# Patient Record
Sex: Female | Born: 1990 | Race: Black or African American | Hispanic: No | Smoking: Former smoker
Health system: Southern US, Community
[De-identification: ages and names within clinical notes are randomized; demographics above are authoritative.]

## PROBLEM LIST (undated history)

## (undated) ENCOUNTER — Inpatient Hospital Stay (HOSPITAL_COMMUNITY): Payer: Self-pay

## (undated) DIAGNOSIS — J45909 Unspecified asthma, uncomplicated: Secondary | ICD-10-CM

## (undated) DIAGNOSIS — M2550 Pain in unspecified joint: Secondary | ICD-10-CM

## (undated) DIAGNOSIS — A749 Chlamydial infection, unspecified: Secondary | ICD-10-CM

## (undated) DIAGNOSIS — F419 Anxiety disorder, unspecified: Secondary | ICD-10-CM

## (undated) DIAGNOSIS — F32A Depression, unspecified: Secondary | ICD-10-CM

## (undated) DIAGNOSIS — D649 Anemia, unspecified: Secondary | ICD-10-CM

## (undated) DIAGNOSIS — F329 Major depressive disorder, single episode, unspecified: Secondary | ICD-10-CM

## (undated) HISTORY — PX: WISDOM TOOTH EXTRACTION: SHX21

## (undated) HISTORY — PX: APPENDECTOMY: SHX54

## (undated) HISTORY — DX: Anxiety disorder, unspecified: F41.9

---

## 2011-11-18 ENCOUNTER — Emergency Department: Payer: Self-pay | Admitting: Unknown Physician Specialty

## 2011-11-18 LAB — BASIC METABOLIC PANEL
Anion Gap: 5 — ABNORMAL LOW (ref 7–16)
BUN: 13 mg/dL (ref 7–18)
Calcium, Total: 9 mg/dL (ref 8.5–10.1)
Chloride: 106 mmol/L (ref 98–107)
Co2: 28 mmol/L (ref 21–32)
Creatinine: 1 mg/dL (ref 0.60–1.30)
EGFR (African American): >60
Osmolality: 277 (ref 275–301)
Potassium: 4 mmol/L (ref 3.5–5.1)
Sodium: 139 mmol/L (ref 136–145)

## 2011-11-18 LAB — URINALYSIS, COMPLETE
Bacteria: NONE SEEN
Bilirubin,UR: NEGATIVE
Glucose,UR: NEGATIVE mg/dL (ref 0–75)
Ketone: NEGATIVE
Leukocyte Esterase: NEGATIVE
Nitrite: NEGATIVE
RBC,UR: 7 /HPF (ref 0–5)
Specific Gravity: 1.031 (ref 1.003–1.030)
WBC UR: 3 /HPF (ref 0–5)

## 2011-11-18 LAB — CBC
MCH: 29.3 pg (ref 26.0–34.0)
MCHC: 33.7 g/dL (ref 32.0–36.0)
MCV: 87 fL (ref 80–100)
Platelet: 224 10*3/uL (ref 150–440)
RBC: 3.93 10*6/uL (ref 3.80–5.20)
RDW: 14 % (ref 11.5–14.5)

## 2011-11-18 LAB — HCG, QUANTITATIVE, PREGNANCY: Beta Hcg, Quant.: <1 m[IU]/mL — ABNORMAL LOW

## 2011-12-21 ENCOUNTER — Encounter (HOSPITAL_COMMUNITY): Payer: Self-pay

## 2011-12-21 ENCOUNTER — Inpatient Hospital Stay (HOSPITAL_COMMUNITY)
Admission: AD | Admit: 2011-12-21 | Discharge: 2011-12-21 | Disposition: A | Discharge status: Hospice | Payer: Medicaid Other | Source: Ambulatory Visit | Attending: Family Medicine | Admitting: Family Medicine

## 2011-12-21 ENCOUNTER — Inpatient Hospital Stay (HOSPITAL_COMMUNITY): Payer: Medicaid Other

## 2011-12-21 DIAGNOSIS — R109 Unspecified abdominal pain: Secondary | ICD-10-CM | POA: Insufficient documentation

## 2011-12-21 DIAGNOSIS — O99891 Other specified diseases and conditions complicating pregnancy: Secondary | ICD-10-CM | POA: Insufficient documentation

## 2011-12-21 DIAGNOSIS — O26899 Other specified pregnancy related conditions, unspecified trimester: Secondary | ICD-10-CM

## 2011-12-21 DIAGNOSIS — M545 Low back pain, unspecified: Secondary | ICD-10-CM | POA: Insufficient documentation

## 2011-12-21 HISTORY — DX: Unspecified asthma, uncomplicated: J45.909

## 2011-12-21 LAB — URINALYSIS, ROUTINE W REFLEX MICROSCOPIC
Bilirubin Urine: NEGATIVE
Nitrite: NEGATIVE
Protein, ur: NEGATIVE mg/dL
Specific Gravity, Urine: >1.03 — ABNORMAL HIGH (ref 1.005–1.030)
Urobilinogen, UA: 0.2 mg/dL (ref 0.0–1.0)

## 2011-12-21 LAB — CBC
MCH: 29.3 pg (ref 26.0–34.0)
MCHC: 34 g/dL (ref 30.0–36.0)
MCV: 86.4 fL (ref 78.0–100.0)
Platelets: 223 10*3/uL (ref 150–400)
RDW: 13.7 % (ref 11.5–15.5)
WBC: 8 10*3/uL (ref 4.0–10.5)

## 2011-12-21 LAB — WET PREP, GENITAL: Yeast Wet Prep HPF POC: NONE SEEN

## 2011-12-21 NOTE — MAU Note (Signed)
Pt c/o urgency and low abd pain for the past week.  Says her bladder feels like she needs to void more when the stream is done.

## 2011-12-21 NOTE — MAU Note (Signed)
Stomach & back pain x1 week. Sharp pain in back, "small sharp pains" in abdomen when urinates. Denies vaginal bleeding or discharge. LMP 11/14/11

## 2011-12-21 NOTE — MAU Provider Note (Signed)
History     CSN: [id number]  Arrival date and time: 12/21/11 1601   First Provider Initiated Contact with Patient 12/21/11 1751      Chief Complaint  Patient presents with  . Abdominal Pain   HPI Claudia Gonzales is 21 y.o. G2P0101 [redacted]w[redacted]d weeks presenting with lower back and lower abdominal pain X 1 week.  Describes as sharp.  Concerned because she delivered her son at 98- 30 weeks.  Denies vaginal bleeding.   Nausea with 1X day vomiting.  Recently moved from [place].     Past Medical History  Diagnosis Date  . Asthma     Past Surgical History  Procedure Date  . Appendectomy   . Wisdom tooth extraction     Family History  Problem Relation Age of Onset  . Hypertension Mother   . Cancer Father   . Cancer Maternal Grandmother   . Cancer Maternal Grandfather   . Cancer Paternal Grandmother   . Cancer Paternal Grandfather     History  Substance Use Topics  . Smoking status: Current Every Day Smoker -- 0.5 packs/day for 1 years    Types: Cigarettes  . Smokeless tobacco: Not on file  . Alcohol Use: 2.3 oz/week    1 Glasses of wine, 1 Cans of beer, 1 Shots of liquor, 1 Drinks containing 0.5 oz of alcohol per week     weekly    Allergies: No Known Allergies  No prescriptions prior to admission    Review of Systems  Constitutional: Negative.   Respiratory: Negative.   Cardiovascular: Negative.   Gastrointestinal: Positive for nausea, vomiting (x1 daily) and abdominal pain (lower).  Genitourinary:       Neg for bleeding or discharge   Physical Exam   Blood pressure 116/59, pulse 88, temperature 99 F (37.2 C), temperature source Oral, resp. rate 16, height 5' 6.5" (1.689 m), weight 98.612 kg (217 lb 6.4 oz), last menstrual period 11/14/2011, SpO2 100.00%.  Physical Exam  Constitutional: She is oriented to person, place, and time. She appears well-developed and well-nourished. No distress.  HENT:  Head: Normocephalic.  Neck: Normal range of motion.    Cardiovascular: Normal rate.   Respiratory: Effort normal.  GI: Soft. She exhibits no distension and no mass. There is tenderness (mild tenderness bilaterally lower quads). There is no rebound and no guarding.  Genitourinary: Uterus is not enlarged and not tender. Cervix exhibits no motion tenderness, no discharge and no friability. Right adnexum displays no mass, no tenderness and no fullness. Left adnexum displays tenderness (mild ). Left adnexum displays no mass and no fullness. No tenderness or bleeding around the vagina. Vaginal discharge (frothy with odor) found.  Neurological: She is alert and oriented to person, place, and time.  Skin: Skin is warm and dry.  Psychiatric: She has a normal mood and affect. Her behavior is normal.   Results for orders placed during the hospital encounter of 12/21/11 (from the past 24 hour(s))  URINALYSIS, ROUTINE W REFLEX MICROSCOPIC     Status: Abnormal   Collection Time   12/21/11  4:20 PM      Component Value Range   Color, Urine YELLOW  YELLOW   APPearance CLEAR  CLEAR   Specific Gravity, Urine >1.030 (*) 1.005 - 1.030   pH 6.0  5.0 - 8.0   Glucose, UA NEGATIVE  NEGATIVE mg/dL   Hgb urine dipstick NEGATIVE  NEGATIVE   Bilirubin Urine NEGATIVE  NEGATIVE   Ketones, ur NEGATIVE  NEGATIVE mg/dL  Protein, ur NEGATIVE  NEGATIVE mg/dL   Urobilinogen, UA 0.2  0.0 - 1.0 mg/dL   Nitrite NEGATIVE  NEGATIVE   Leukocytes, UA NEGATIVE  NEGATIVE  POCT PREGNANCY, URINE     Status: Abnormal   Collection Time   12/21/11  4:32 PM      Component Value Range   Preg Test, Ur POSITIVE (*) NEGATIVE  CBC     Status: Abnormal   Collection Time   12/21/11  6:09 PM      Component Value Range   WBC 8.0  4.0 - 10.5 K/uL   RBC 3.75 (*) 3.87 - 5.11 MIL/uL   Hemoglobin 11.0 (*) 12.0 - 15.0 g/dL   HCT 54.0 (*) 98.1 - 19.1 %   MCV 86.4  78.0 - 100.0 fL   MCH 29.3  26.0 - 34.0 pg   MCHC 34.0  30.0 - 36.0 g/dL   RDW 47.8  29.5 - 62.1 %   Platelets 223  150 - 400 K/uL   ABO/RH     Status: Normal   Collection Time   12/21/11  6:09 PM      Component Value Range   ABO/RH(D) B POS    HCG, QUANTITATIVE, PREGNANCY     Status: Abnormal   Collection Time   12/21/11  6:10 PM      Component Value Range   hCG, Beta Chain, Quant, S 936 (*) <5 mIU/mL  WET PREP, GENITAL     Status: Abnormal   Collection Time   12/21/11  6:25 PM      Component Value Range   Yeast Wet Prep HPF POC NONE SEEN  NONE SEEN   Trich, Wet Prep NONE SEEN  NONE SEEN   Clue Cells Wet Prep HPF POC NONE SEEN  NONE SEEN   WBC, Wet Prep HPF POC MODERATE (*) NONE SEEN   MAU Course  Procedures  GC/CHL culture to lab  MDM  20:00 Care turned over to [place], [place] US Ob Transvaginal  12/21/2011  *RADIOLOGY REPORT*  Clinical Data: Pregnancy.  Abdomen and back pain.  OBSTETRIC <14 WK [place] AND TRANSVAGINAL OB US  Technique:  Both transabdominal and transvaginal ultrasound examinations were performed for complete evaluation of the gestation as well as the maternal uterus, adnexal regions, and pelvic cul-de-sac.  Transvaginal technique was performed to assess early pregnancy.  Comparison:  None.  Intrauterine gestational sac:  There is a suspected early intrauterine gestational sac, quite small. Yolk sac: No Embryo: None seen Cardiac Activity: None Heart Rate: n/a bpm  MSD: 2.3 mm  4 w 5 d  Maternal uterus/adnexae: Anterior fundal fibroid,  3.2 x 3.4 cm in cross section.  Normal ovaries. Trace free fluid.  IMPRESSION: Possible early intrauterine gestational sac, 2.3 mm diameter, corresponding to a approximately 4-week-5-day pregnancy.  Continued surveillance is warranted.  No adnexal mass or significant free fluid.  Anterior fundal uterine fibroid 3.2 x 3.4 mm cross-section.   Original Report Authenticated By: Elsie Stain, M.D.     Assessment and Plan  A:  Abdominal pain in early pregnancy      Cannot rule out ectopic yet, but probable IUP  P:  Return in 2 days for quant HCG and will repeat US in 1-2  weeks. Wynelle Bourgeois CNM  KEY,EVE M 12/21/2011, 7:55 PM

## 2011-12-21 NOTE — MAU Provider Note (Signed)
Chart reviewed and agree with management and plan.  

## 2011-12-22 LAB — GC/CHLAMYDIA PROBE AMP, GENITAL
Chlamydia, DNA Probe: POSITIVE — AB
GC Probe Amp, Genital: NEGATIVE

## 2012-01-20 ENCOUNTER — Encounter (HOSPITAL_COMMUNITY): Payer: Self-pay

## 2012-01-20 ENCOUNTER — Inpatient Hospital Stay (HOSPITAL_COMMUNITY)
Admission: AD | Admit: 2012-01-20 | Discharge: 2012-01-20 | Disposition: A | Discharge status: Home / Self Care | Payer: Medicaid Other | Source: Ambulatory Visit | Attending: Obstetrics & Gynecology | Admitting: Obstetrics & Gynecology

## 2012-01-20 DIAGNOSIS — O219 Vomiting of pregnancy, unspecified: Secondary | ICD-10-CM | POA: Diagnosis present

## 2012-01-20 DIAGNOSIS — O99891 Other specified diseases and conditions complicating pregnancy: Secondary | ICD-10-CM | POA: Insufficient documentation

## 2012-01-20 DIAGNOSIS — Z3490 Encounter for supervision of normal pregnancy, unspecified, unspecified trimester: Secondary | ICD-10-CM

## 2012-01-20 DIAGNOSIS — R109 Unspecified abdominal pain: Secondary | ICD-10-CM | POA: Insufficient documentation

## 2012-01-20 LAB — URINALYSIS, ROUTINE W REFLEX MICROSCOPIC
Nitrite: NEGATIVE
Protein, ur: NEGATIVE mg/dL
Specific Gravity, Urine: >1.03 — ABNORMAL HIGH (ref 1.005–1.030)
Urobilinogen, UA: 0.2 mg/dL (ref 0.0–1.0)

## 2012-01-20 MED ORDER — ONDANSETRON 8 MG PO TBDP
8.0000 mg | ORAL_TABLET | ORAL | Status: AC
  Administered 2012-01-20: 8 mg via ORAL
  Filled 2012-01-20: qty 1

## 2012-01-20 MED ORDER — ONDANSETRON 4 MG PO TBDP: 4.0000 mg | ORAL_TABLET | Freq: Four times a day (QID) | ORAL | Status: DC | PRN

## 2012-01-20 MED ORDER — AZITHROMYCIN 250 MG PO TABS
1000.0000 mg | ORAL_TABLET | ORAL | Status: AC
  Administered 2012-01-20: 1000 mg via ORAL
  Filled 2012-01-20: qty 4

## 2012-01-20 NOTE — MAU Note (Signed)
Pt 9.4wks having lower abd pain that comes and goes for a month.  Denies bleeding or discharge.

## 2012-01-20 NOTE — MAU Provider Note (Signed)
Chief Complaint: Abdominal Pain   First Provider Initiated Contact with Patient 01/20/12 2031     SUBJECTIVE HPI: Claudia Gonzales is a 21 y.o. G2P0101 at [redacted]w[redacted]d by LMP who presents to maternity admissions reporting bilateral inguinal pain with sneezing, movement.  She was seen 12/21/11 and had U/S indicating possible gestational sac and was instructed to return to MAU for repeat quant hcg in 48 hours.  She was unable to get to MAU for f/u and has not been seen since this date 1 month ago.  She was informed of her diagnoses of Chlamydia, from swab collected 12/21/11, and did take PO meds at health department, but vomited a few minutes after taking medication.  She did not tell any health department staff as she vomited on the way out of the building.  She denies vaginal bleeding, vaginal itching/burning, urinary symptoms, h/a, dizziness, n/v, or fever/chills.     Past Medical History  Diagnosis Date  . Asthma    Past Surgical History  Procedure Date  . Appendectomy   . Wisdom tooth extraction    History   Social History  . Marital Status: Single    Spouse Name: N/A    Number of Children: N/A  . Years of Education: N/A   Occupational History  . Not on file.   Social History Main Topics  . Smoking status: Current Every Day Smoker -- 0.5 packs/day for 1 years    Types: Cigarettes  . Smokeless tobacco: Not on file  . Alcohol Use: 2.3 oz/week    1 Glasses of wine, 1 Cans of beer, 1 Shots of liquor, 1 Drinks containing 0.5 oz of alcohol per week     weekly  . Drug Use: No  . Sexually Active: Yes    Birth Control/ Protection: None   Other Topics Concern  . Not on file   Social History Narrative  . No narrative on file   No current facility-administered medications on file prior to encounter.   No current outpatient prescriptions on file prior to encounter.   No Known Allergies  ROS: Pertinent items in HPI  OBJECTIVE Blood pressure 114/66, pulse 86, resp. rate 16, height 5'  6.5" (1.689 m), weight 98.884 kg (218 lb), last menstrual period 11/14/2011. GENERAL: Well-developed, well-nourished female in no acute distress.  HEENT: Normocephalic HEART: normal rate RESP: normal effort ABDOMEN: Soft, non-tender EXTREMITIES: Nontender, no edema NEURO: Alert and oriented SPECULUM EXAM: Deferred  LAB RESULTS Results for orders placed during the hospital encounter of 01/20/12 (from the past 24 hour(s))  URINALYSIS, ROUTINE W REFLEX MICROSCOPIC     Status: Abnormal   Collection Time   01/20/12  7:30 PM      Component Value Range   Color, Urine YELLOW  YELLOW   APPearance CLEAR  CLEAR   Specific Gravity, Urine >1.030 (*) 1.005 - 1.030   pH 6.0  5.0 - 8.0   Glucose, UA NEGATIVE  NEGATIVE mg/dL   Hgb urine dipstick NEGATIVE  NEGATIVE   Bilirubin Urine NEGATIVE  NEGATIVE   Ketones, ur 15 (*) NEGATIVE mg/dL   Protein, ur NEGATIVE  NEGATIVE mg/dL   Urobilinogen, UA 0.2  0.0 - 1.0 mg/dL   Nitrite NEGATIVE  NEGATIVE   Leukocytes, UA NEGATIVE  NEGATIVE    IMAGING Bedside sono confirms IUP with FHR  ASSESSMENT No diagnosis found.  PLAN Pt retreated for Chlamydia with azithromycin 1000 mg PO and Zofran 8 mg ODT in MAU Test of cure to be collected during prenatal  care Discharge home Zofran ODT 4 mg PO Q 8 hours PRN nausea Encourage increased PO fluids F/U with early prenatal care Pregnancy verification letter and list of providers given Return to MAU as needed     Medication List     As of 01/20/2012  9:06 PM    ASK your doctor about these medications         acetaminophen 500 MG tablet   Commonly known as: TYLENOL   Take 1,000 mg by mouth every 6 (six) hours as needed. For pain         Sharen Counter Certified Nurse-Midwife 01/20/2012  9:06 PM

## 2012-01-20 NOTE — MAU Note (Signed)
Patient is not in the lobby when called to triage.  

## 2012-03-16 ENCOUNTER — Inpatient Hospital Stay (HOSPITAL_COMMUNITY)
Admission: AD | Admit: 2012-03-16 | Discharge: 2012-03-16 | Disposition: A | Discharge status: Home / Self Care | Payer: Medicaid Other | Source: Ambulatory Visit | Attending: Obstetrics & Gynecology | Admitting: Obstetrics & Gynecology

## 2012-03-16 ENCOUNTER — Encounter (HOSPITAL_COMMUNITY): Payer: Self-pay

## 2012-03-16 ENCOUNTER — Inpatient Hospital Stay (HOSPITAL_COMMUNITY): Payer: Medicaid Other

## 2012-03-16 DIAGNOSIS — O2390 Unspecified genitourinary tract infection in pregnancy, unspecified trimester: Secondary | ICD-10-CM | POA: Insufficient documentation

## 2012-03-16 DIAGNOSIS — B9689 Other specified bacterial agents as the cause of diseases classified elsewhere: Secondary | ICD-10-CM | POA: Insufficient documentation

## 2012-03-16 DIAGNOSIS — A499 Bacterial infection, unspecified: Secondary | ICD-10-CM | POA: Insufficient documentation

## 2012-03-16 DIAGNOSIS — R109 Unspecified abdominal pain: Secondary | ICD-10-CM | POA: Insufficient documentation

## 2012-03-16 DIAGNOSIS — N760 Acute vaginitis: Secondary | ICD-10-CM | POA: Insufficient documentation

## 2012-03-16 LAB — CBC
HCT: 28.7 % — ABNORMAL LOW (ref 36.0–46.0)
MCHC: 34.5 g/dL (ref 30.0–36.0)
RDW: 14 % (ref 11.5–15.5)

## 2012-03-16 LAB — URINALYSIS, ROUTINE W REFLEX MICROSCOPIC
Ketones, ur: NEGATIVE mg/dL
Protein, ur: NEGATIVE mg/dL
Urobilinogen, UA: 0.2 mg/dL (ref 0.0–1.0)

## 2012-03-16 LAB — DIFFERENTIAL
Basophils Absolute: 0 10*3/uL (ref 0.0–0.1)
Basophils Relative: 0 % (ref 0–1)
Eosinophils Absolute: 0.2 10*3/uL (ref 0.0–0.7)
Monocytes Absolute: 0.4 10*3/uL (ref 0.1–1.0)
Neutro Abs: 5 10*3/uL (ref 1.7–7.7)
Neutrophils Relative %: 66 % (ref 43–77)

## 2012-03-16 LAB — URINE MICROSCOPIC-ADD ON

## 2012-03-16 LAB — RPR: RPR Ser Ql: NONREACTIVE

## 2012-03-16 LAB — WET PREP, GENITAL: Trich, Wet Prep: NONE SEEN

## 2012-03-16 LAB — HEPATITIS B SURFACE ANTIGEN: Hepatitis B Surface Ag: NEGATIVE

## 2012-03-16 MED ORDER — METRONIDAZOLE 500 MG PO TABS: 500.0000 mg | ORAL_TABLET | Freq: Two times a day (BID) | ORAL | Status: DC

## 2012-03-16 MED ORDER — PRENATAL VITAMINS PLUS 27-1 MG PO TABS: 1.0000 | ORAL_TABLET | Freq: Every day | ORAL | Status: DC

## 2012-03-16 NOTE — MAU Provider Note (Signed)
Attestation of Attending Supervision of Advanced Practitioner (PA/CNM/NP): Evaluation and management procedures were performed by the Advanced Practitioner under my supervision and collaboration.  I have reviewed the Advanced Practitioner's note and chart, and I agree with the management and plan.  Raffi Milstein, MD, FACOG Attending Obstetrician & Gynecologist Faculty Practice, [facility]  

## 2012-03-16 NOTE — MAU Provider Note (Signed)
History     CSN: [id number]  Arrival date and time: 03/16/12 4696   First Provider Initiated Contact with Patient 03/16/12 [phone]      Chief Complaint  Patient presents with  . Abdominal Cramping   HPI Claudia Gonzales 21 y.o. [redacted]w[redacted]d by LMP of 11-14-11.  Has not yet started prenatal care.  Is having lower abdominal cramping x 2 weeks.  Feels like contractions.  No leaking.  No bleeding.  History of one preterm birth at 30 weeks.  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 Asthma   . Preterm labor     Past Surgical History  Procedure Date  . Appendectomy   . Wisdom tooth extraction     Family History  Problem Relation Age of Onset  . Hypertension Mother   . Cancer Father   . Cancer Maternal Grandmother   . Cancer Maternal Grandfather   . Cancer Paternal Grandmother   . Cancer Paternal Grandfather     History  Substance Use Topics  . Smoking status: Former Smoker -- 0.5 packs/day for 1 years    Types: Cigarettes  . Smokeless tobacco: Never Used  . Alcohol Use: No    Allergies: No Known Allergies  Prescriptions prior to admission  Medication Sig Dispense Refill  . acetaminophen (TYLENOL) 500 MG tablet Take 1,000 mg by mouth every 6 (six) hours as needed. For pain        Review of Systems  Constitutional: Negative for fever.  Gastrointestinal: Positive for abdominal pain. Negative for nausea, vomiting, diarrhea and constipation.  Genitourinary:       No vaginal discharge. No vaginal bleeding. No dysuria.   Physical Exam   Blood pressure 128/71, pulse 94, temperature 97.9 F (36.6 C), temperature source Oral, resp. rate 16, height 5\' 6"  (1.676 m), weight 103.148 kg (227 lb 6.4 oz), last menstrual period 11/14/2011, SpO2 100.00%.  Physical Exam  Nursing note and vitals reviewed. Constitutional: She is oriented to  person, place, and time. She appears well-developed and well-nourished.  HENT:  Head: Normocephalic.  Eyes: EOM are normal.  Neck: Neck supple.  GI: Soft. There is tenderness. There is no rebound and no guarding.  Genitourinary:       Speculum exam: Vagina - Small amount of creamy discharge, no odor Cervix - No contact bleeding Bimanual exam: Cervix closed, thick Uterus 20 week size Adnexa non tender, no masses bilaterally GC/Chlam, wet prep done Chaperone present for exam.  Musculoskeletal: Normal range of motion.  Neurological: She is alert and oriented to person, place, and time.  Skin: Skin is warm and dry.  Psychiatric: She has a normal mood and affect.    MAU Course  Procedures  MDM Ultrasound reviewed.  Confirmed LMP=US dating.   Results for orders placed during the hospital encounter of 03/16/12 (from the past 24 hour(s))  URINALYSIS, ROUTINE W REFLEX MICROSCOPIC     Status: Abnormal   Collection Time   03/16/12  7:40 AM      Component Value Range   Color, Urine YELLOW  YELLOW   APPearance CLEAR  CLEAR   Specific Gravity, Urine >1.030 (*) 1.005 - 1.030   pH 6.0  5.0 - 8.0   Glucose, UA NEGATIVE  NEGATIVE mg/dL  Hgb urine dipstick NEGATIVE  NEGATIVE   Bilirubin Urine NEGATIVE  NEGATIVE   Ketones, ur NEGATIVE  NEGATIVE mg/dL   Protein, ur NEGATIVE  NEGATIVE mg/dL   Urobilinogen, UA 0.2  0.0 - 1.0 mg/dL   Nitrite NEGATIVE  NEGATIVE   Leukocytes, UA MODERATE (*) NEGATIVE  URINE MICROSCOPIC-ADD ON     Status: Abnormal   Collection Time   03/16/12  7:40 AM      Component Value Range   Squamous Epithelial / LPF MANY (*) RARE   WBC, UA 3-6  <3 WBC/hpf   RBC / HPF 0-2  <3 RBC/hpf   Bacteria, UA RARE  RARE  WET PREP, GENITAL     Status: Abnormal   Collection Time   03/16/12  8:40 AM      Component Value Range   Yeast Wet Prep HPF POC NONE SEEN  NONE SEEN   Trich, Wet Prep NONE SEEN  NONE SEEN   Clue Cells Wet Prep HPF POC MODERATE (*) NONE SEEN   WBC, Wet  Prep HPF POC MANY (*) NONE SEEN  CBC     Status: Abnormal   Collection Time   03/16/12  9:25 AM      Component Value Range   WBC 7.6  4.0 - 10.5 K/uL   RBC 3.32 (*) 3.87 - 5.11 MIL/uL   Hemoglobin 9.9 (*) 12.0 - 15.0 g/dL   HCT 96.0 (*) 45.4 - 09.8 %   MCV 86.4  78.0 - 100.0 fL   MCH 29.8  26.0 - 34.0 pg   MCHC 34.5  30.0 - 36.0 g/dL   RDW 11.9  14.7 - 82.9 %   Platelets 182  150 - 400 K/uL  DIFFERENTIAL     Status: Normal   Collection Time   03/16/12  9:25 AM      Component Value Range   Neutrophils Relative 66  43 - 77 %   Neutro Abs 5.0  1.7 - 7.7 K/uL   Lymphocytes Relative 26  12 - 46 %   Lymphs Abs 2.0  0.7 - 4.0 K/uL   Monocytes Relative 6  3 - 12 %   Monocytes Absolute 0.4  0.1 - 1.0 K/uL   Eosinophils Relative 2  0 - 5 %   Eosinophils Absolute 0.2  0.0 - 0.7 K/uL   Basophils Relative 0  0 - 1 %   Basophils Absolute 0.0  0.0 - 0.1 K/uL    Assessment and Plan  Pregnancy BV  Plan Begin prenatal care - [facility]12 at 9:30 am to begin care. Verification of pregnancy given. Rx metronidazole 500 mg po bid x 7 days (#14) no refills Drink at least 8 8-oz glasses of water every day. Rx prenatal vitamins with Folic Acid 1 mg one po q day   Claudia Gonzales 03/16/2012, 8:45 AM

## 2012-03-16 NOTE — MAU Note (Signed)
Pt states notes intermittent cramping on her left lower abdomen. Just got over gi bug. Denies bleeding or abnormal vaginal d/c.

## 2012-03-17 LAB — RUBELLA SCREEN: Rubella: 11 Index — ABNORMAL HIGH (ref <0.90)

## 2012-03-17 LAB — GC/CHLAMYDIA PROBE AMP: CT Probe RNA: NEGATIVE

## 2012-03-28 ENCOUNTER — Encounter: Payer: Medicaid Other | Admitting: Family Medicine

## 2012-04-06 NOTE — L&D Delivery Note (Signed)
Delivery Note At 9:24 AM a viable and healthy female was delivered via Vaginal, Spontaneous Delivery (Presentation: Left Occiput Anterior).  APGAR: 8, 9; weight pending .   Placenta status: Intact, Spontaneous.  Cord: 3 vessels with the following complications: None.    Anesthesia: Epidural  Episiotomy: None Lacerations: None Est. Blood Loss (mL): 300  Mom to postpartum.  Baby to nursery-stable.  [facility] 08/24/2012, 9:58 AM

## 2012-04-15 ENCOUNTER — Inpatient Hospital Stay (HOSPITAL_COMMUNITY)
Admission: AD | Admit: 2012-04-15 | Discharge: 2012-04-15 | Disposition: A | Discharge status: Home / Self Care | Payer: Medicaid Other | Source: Ambulatory Visit | Attending: Obstetrics & Gynecology | Admitting: Obstetrics & Gynecology

## 2012-04-15 ENCOUNTER — Encounter (HOSPITAL_COMMUNITY): Payer: Self-pay | Admitting: *Deleted

## 2012-04-15 DIAGNOSIS — B9689 Other specified bacterial agents as the cause of diseases classified elsewhere: Secondary | ICD-10-CM | POA: Insufficient documentation

## 2012-04-15 DIAGNOSIS — F411 Generalized anxiety disorder: Secondary | ICD-10-CM | POA: Insufficient documentation

## 2012-04-15 DIAGNOSIS — N949 Unspecified condition associated with female genital organs and menstrual cycle: Secondary | ICD-10-CM | POA: Insufficient documentation

## 2012-04-15 DIAGNOSIS — F419 Anxiety disorder, unspecified: Secondary | ICD-10-CM

## 2012-04-15 DIAGNOSIS — N760 Acute vaginitis: Secondary | ICD-10-CM | POA: Insufficient documentation

## 2012-04-15 DIAGNOSIS — A499 Bacterial infection, unspecified: Secondary | ICD-10-CM | POA: Insufficient documentation

## 2012-04-15 DIAGNOSIS — R079 Chest pain, unspecified: Secondary | ICD-10-CM | POA: Insufficient documentation

## 2012-04-15 DIAGNOSIS — R109 Unspecified abdominal pain: Secondary | ICD-10-CM

## 2012-04-15 LAB — URINALYSIS, ROUTINE W REFLEX MICROSCOPIC
Bilirubin Urine: NEGATIVE
Glucose, UA: NEGATIVE mg/dL
Ketones, ur: 15 mg/dL — AB
Protein, ur: NEGATIVE mg/dL

## 2012-04-15 LAB — WET PREP, GENITAL: Trich, Wet Prep: NONE SEEN

## 2012-04-15 LAB — URINE MICROSCOPIC-ADD ON

## 2012-04-15 MED ORDER — FLUCONAZOLE 150 MG PO TABS
150.0000 mg | ORAL_TABLET | Freq: Once | ORAL | Status: AC
  Administered 2012-04-15: 150 mg via ORAL
  Filled 2012-04-15: qty 1

## 2012-04-15 MED ORDER — HYDROXYZINE HCL 10 MG PO TABS: 20.0000 mg | ORAL_TABLET | Freq: Three times a day (TID) | ORAL | Status: DC | PRN

## 2012-04-15 MED ORDER — OMEPRAZOLE 20 MG PO CPDR: 20.0000 mg | DELAYED_RELEASE_CAPSULE | Freq: Every day | ORAL | Status: DC

## 2012-04-15 NOTE — MAU Provider Note (Signed)
  I have seen and examined patient and agree with above. Patient describes shortness of breath especially at night when lying supine. Improved with sitting up. Patient does admit to anxiety. Childhood asthma, but not wheezing or coughing. Pt has appointment in [facility] OB clinic next week. If symptoms continue, patient can discuss other options for f/u at appointment. No hypoxia, lungs CTAB, no acute findings. Likely physiological respiratory changes of pregnancy. Napoleon Form, MD

## 2012-04-15 NOTE — MAU Provider Note (Signed)
Chief Complaint:  Vaginal Discharge and Chest Pain   HPI: Claudia Gonzales is a 22 y.o. G2P0101 at [redacted]w[redacted]d who presents to maternity admissions reporting one week of intermittent chest pain along with some vaginal discharge.  CP has been ongoing for one week now, is described more as of shortness of breath, worse when taking a deep breath and worse when laying down.  Denies radiation down the left arm or into the jaw, but does state it goes into her R scapular region.  Denies diaphoresis, feeling of impending doom, nausea, vomiting, brash, cough, congestion, fever, chills, sweats, lower leg edema.   No FHx or personal history of coagulation disorders or DVT/PE.   Denies contractions, leakage of fluid or vaginal bleeding. Good fetal movement.   Has had vaginal discharge, not increased since she was last seen about one month ago and was supposed to be taking Flagyl but did not fill this Rx until about two days ago due to cost.    Has not had pre-natal care and is scheduled to have a check up on 05/02/12.    Pregnancy Course:   Past Medical History: Past Medical History  Diagnosis Date  . Asthma   . Preterm labor     Past obstetric history: OB History    Grav Para Term Preterm Abortions TAB SAB Ect Mult Living   2 1  1      1      # Outc Date GA Lbr Len/2nd Wgt Sex Del Anes PTL Lv   1 PRE 10/11 [redacted]w[redacted]d  2lb7oz(1.106kg) M SVD EPI Yes Yes   2 CUR               Past Surgical History: Past Surgical History  Procedure Date  . Appendectomy   . Wisdom tooth extraction     Family History: Family History  Problem Relation Age of Onset  . Hypertension Mother   . Cancer Father   . Cancer Maternal Grandmother   . Cancer Maternal Grandfather   . Cancer Paternal Grandmother   . Cancer Paternal Grandfather     Social History: History  Substance Use Topics  . Smoking status: Former Smoker -- 0.5 packs/day for 1 years    Types: Cigarettes  . Smokeless tobacco: Never Used  . Alcohol  Use: No    Allergies: No Known Allergies  Meds:  Prescriptions prior to admission  Medication Sig Dispense Refill  . acetaminophen (TYLENOL) 500 MG tablet Take 1,000 mg by mouth every 6 (six) hours as needed. For pain      . metroNIDAZOLE (FLAGYL) 500 MG tablet Take 1 tablet (500 mg total) by mouth 2 (two) times daily. No alcohol while taking this medication  14 tablet  0  . Prenatal Vit-Fe Fumarate-FA (PRENATAL VITAMINS PLUS) 27-1 MG TABS Take 1 tablet by mouth daily. Generic is OK - Prenatal vitamin with 1 mg Folic acid  30 tablet  0    ROS: Pertinent findings in history of present illness.  Physical Exam  Blood pressure 114/66, pulse 82, temperature 98.3 F (36.8 C), temperature source Oral, resp. rate 18, height 5\' 7"  (1.702 m), weight 103.329 kg (227 lb 12.8 oz), last menstrual period 11/14/2011, SpO2 100.00%. Vitals reviewed, appropriate  GENERAL: NAD, sitting comfortable in bed HEENT: EOMI B/L HEART: RRR, 2/6 SEM RUSB  RESP: normal rate, normal effort, no wheezing/rales/rhonchi ABDOMEN: Soft, non-tender, gravid appropriate for gestational age, no RUQ/RLQ tenderness EXTREMITIES: Nontender, no edema, pulses intact  NEURO: AAO x 3  SPECULUM  EXAM: No cervical changes, closed.  Large amount of whitish mucoid discharge, foul smelling.  No blood noted   FHT:  Not on monitor   Labs: Wet Prep - pending GC/Chlaymidia - pending    MAU Course: 1) Pt presents with normal vital signs, no distress, c/o anxiety but no S/Sx of PE or cardiac origin.  Speculum exam done and Wet Prep, GC/Chlaymidia done   Assessment: 1)Referred Diaphragmatic Pain  2) BV 3)Anxiety/Possible GERD   Plan: Discharge home Labor precautions and fetal kick counts Will continue Flagyl pending results of Wet Prep Pending GC/Chlaymidia Will give trial of Prilosec for possible reflux in origin Will also give some hydroxyzine for possible anxiety origin Tylenol PRN for pain     Medication List     As of  04/15/2012  1:31 PM    ASK your doctor about these medications         acetaminophen 500 MG tablet   Commonly known as: TYLENOL   Take 1,000 mg by mouth every 6 (six) hours as needed. For pain      metroNIDAZOLE 500 MG tablet   Commonly known as: FLAGYL   Take 1 tablet (500 mg total) by mouth 2 (two) times daily. No alcohol while taking this medication      PRENATAL VITAMINS PLUS 27-1 MG Tabs   Take 1 tablet by mouth daily. Generic is OK - Prenatal vitamin with 1 mg Folic acid        Masoud Nyce R. Paulina Fusi, DO of [facility] 04/15/2012, 1:33 PM

## 2012-04-15 NOTE — MAU Note (Signed)
Patient has had no prenatal care, has her first appointment 1-27 a the [facility]. States she is currently being treated for BV but does not feel like it is going away, continues to have a vaginal discharge. Patient states that when she lays down she feels upper chest tightness. Not feeling any at this time.

## 2012-04-16 LAB — GC/CHLAMYDIA PROBE AMP
CT Probe RNA: NEGATIVE
GC Probe RNA: NEGATIVE

## 2012-04-25 ENCOUNTER — Encounter (HOSPITAL_COMMUNITY): Payer: Self-pay | Admitting: *Deleted

## 2012-04-25 ENCOUNTER — Inpatient Hospital Stay (HOSPITAL_COMMUNITY)
Admission: AD | Admit: 2012-04-25 | Discharge: 2012-04-25 | Disposition: A | Discharge status: Home / Self Care | Payer: Medicaid Other | Source: Ambulatory Visit | Attending: Obstetrics & Gynecology | Admitting: Obstetrics & Gynecology

## 2012-04-25 DIAGNOSIS — R109 Unspecified abdominal pain: Secondary | ICD-10-CM | POA: Insufficient documentation

## 2012-04-25 DIAGNOSIS — N949 Unspecified condition associated with female genital organs and menstrual cycle: Secondary | ICD-10-CM | POA: Insufficient documentation

## 2012-04-25 DIAGNOSIS — O479 False labor, unspecified: Secondary | ICD-10-CM

## 2012-04-25 DIAGNOSIS — O4700 False labor before 37 completed weeks of gestation, unspecified trimester: Secondary | ICD-10-CM | POA: Insufficient documentation

## 2012-04-25 LAB — WET PREP, GENITAL

## 2012-04-25 NOTE — MAU Note (Signed)
Pt 23 wks and started with a heaviness in her low abd that made it very difficult to walk.  Denies any bleeding and states it feels like she needs to continue voiding once she has stopped.

## 2012-04-25 NOTE — MAU Provider Note (Signed)
Chief Complaint:  Pelvic Pain   HPI: Claudia Gonzales is a 22 y.o. G2P0101 at 23.2 who presents to maternity admissions reporting some lower abdominal fullness and retaining urine s/p voiding. Pt last sexual intercourse was earlier today and was walking around [employer] when she started to have fullness in her lower abdomen.     Denies diaphoresis, feeling of impending doom, nausea, vomiting, brash, cough, congestion, fever, chills, sweats, lower leg edema.   No dysuria, hematuria, increased urgency but does c/o some increased frequency.   Denies contractions, leakage of fluid or vaginal bleeding. Good fetal movement.   Has had vaginal discharge, not increased since she was seen on 04/15/12.  Was dx with BV and was tx with 7 days of Flagyl   Has not had pre-natal care and is scheduled to have a check up on 05/02/12.    Pregnancy Course:   Past Medical History: Past Medical History  Diagnosis Date  . Asthma   . Preterm labor     Past obstetric history:     OB History    Grav Para Term Preterm Abortions TAB SAB Ect Mult Living   2 1  1      1      # Outc Date GA Lbr Len/2nd Wgt Sex Del Anes PTL Lv   1 PRE 10/11 [redacted]w[redacted]d  2lb7oz(1.106kg) M SVD EPI Yes Yes   2 CUR               Past Surgical History: Past Surgical History  Procedure Date  . Appendectomy   . Wisdom tooth extraction     Family History: Family History  Problem Relation Age of Onset  . Hypertension Mother   . Cancer Father   . Cancer Maternal Grandmother   . Cancer Maternal Grandfather   . Cancer Paternal Grandmother   . Cancer Paternal Grandfather     Social History: History  Substance Use Topics  . Smoking status: Former Smoker -- 0.5 packs/day for 1 years    Types: Cigarettes  . Smokeless tobacco: Never Used  . Alcohol Use: No    Allergies: No Known Allergies  Meds:  Prescriptions prior to admission  Medication Sig Dispense Refill  . hydrOXYzine (ATARAX/VISTARIL) 10 MG tablet Take 2 tablets (20  mg total) by mouth every 8 (eight) hours as needed for itching or anxiety.  30 tablet  0  . omeprazole (PRILOSEC) 20 MG capsule Take 20 mg by mouth 2 (two) times daily.      . Prenatal Vit-Fe Fumarate-FA (PRENATAL VITAMINS PLUS) 27-1 MG TABS Take 1 tablet by mouth daily.      . [DISCONTINUED] omeprazole (PRILOSEC) 20 MG capsule Take 1 capsule (20 mg total) by mouth daily.  30 capsule  0  . [DISCONTINUED] Prenatal Vit-Fe Fumarate-FA (PRENATAL VITAMINS PLUS) 27-1 MG TABS Take 1 tablet by mouth daily. Generic is OK - Prenatal vitamin with 1 mg Folic acid  30 tablet  0    ROS: Pertinent findings in history of present illness.  Physical Exam  Blood pressure 115/60, pulse 100, temperature 98.8 F (37.1 C), temperature source Oral, resp. rate 18, last menstrual period 11/14/2011. Vitals reviewed, appropriate  GENERAL: NAD, sitting comfortable in bed HEENT: EOMI B/L HEART: RRR, 2/6 SEM RUSB  RESP: normal rate, normal effort, no wheezing/rales/rhonchi ABDOMEN: Soft, non-tender, gravid appropriate for gestational age, no RUQ/RLQ tenderness EXTREMITIES: Nontender, no edema, pulses intact  NEURO: AAO x 3  SPECULUM EXAM: No cervical changes, closed.  No blood noted  FHT:  145 bpm, moderate variability, no declerations, no accelerations   Cervical Exam - Finger tip/Tick/-3  Labs: Wet Prep - +BV on 04/15/12 GC/Chlaymidia - Negative on 04/15/12    MAU Course: 1) Will get UA to evaluate for possible UTI.  Will repeat Wet Prep/GC/Chlaymidia    Assessment: 1)Braxton Hicks Contractions   Plan: Discharge home Labor precautions and fetal kick counts Will need to keep appt for initial prenatal care for 05/02/12  Tylenol PRN for pain     Medication List     As of 04/25/2012  4:51 PM    ASK your doctor about these medications         hydrOXYzine 10 MG tablet   Commonly known as: ATARAX/VISTARIL   Take 2 tablets (20 mg total) by mouth every 8 (eight) hours as needed for itching or anxiety.        omeprazole 20 MG capsule   Commonly known as: PRILOSEC   Take 20 mg by mouth 2 (two) times daily.      PRENATAL VITAMINS PLUS 27-1 MG Tabs   Take 1 tablet by mouth daily.          Twana First Paulina Fusi, DO of [facility] 04/25/2012, 4:25 PM

## 2012-04-25 NOTE — MAU Provider Note (Signed)
I saw and examined patient and reviewed labs, studies and fetal heart tracings. Agree with above resident note. No cervical dilation, likely Braxton-Hicks ctx.  Napoleon Form, MD

## 2012-04-26 LAB — GC/CHLAMYDIA PROBE AMP
CT Probe RNA: NEGATIVE
GC Probe RNA: NEGATIVE

## 2012-05-02 ENCOUNTER — Encounter: Payer: Self-pay | Admitting: Obstetrics & Gynecology

## 2012-05-02 ENCOUNTER — Other Ambulatory Visit (HOSPITAL_COMMUNITY)
Admission: RE | Admit: 2012-05-02 | Discharge: 2012-05-02 | Disposition: A | Discharge status: Home / Self Care | Payer: Medicaid Other | Source: Ambulatory Visit | Attending: Obstetrics & Gynecology | Admitting: Obstetrics & Gynecology

## 2012-05-02 ENCOUNTER — Ambulatory Visit (INDEPENDENT_AMBULATORY_CARE_PROVIDER_SITE_OTHER): Payer: Medicaid Other | Admitting: Obstetrics & Gynecology

## 2012-05-02 VITALS — BP 109/70 | Temp 97.3°F | Wt 230.0 lb

## 2012-05-02 DIAGNOSIS — F341 Dysthymic disorder: Secondary | ICD-10-CM

## 2012-05-02 DIAGNOSIS — Z01419 Encounter for gynecological examination (general) (routine) without abnormal findings: Secondary | ICD-10-CM | POA: Insufficient documentation

## 2012-05-02 DIAGNOSIS — O219 Vomiting of pregnancy, unspecified: Secondary | ICD-10-CM

## 2012-05-02 DIAGNOSIS — F32A Depression, unspecified: Secondary | ICD-10-CM | POA: Insufficient documentation

## 2012-05-02 DIAGNOSIS — O09219 Supervision of pregnancy with history of pre-term labor, unspecified trimester: Secondary | ICD-10-CM

## 2012-05-02 DIAGNOSIS — F419 Anxiety disorder, unspecified: Secondary | ICD-10-CM | POA: Insufficient documentation

## 2012-05-02 DIAGNOSIS — F329 Major depressive disorder, single episode, unspecified: Secondary | ICD-10-CM

## 2012-05-02 DIAGNOSIS — Z8751 Personal history of pre-term labor: Secondary | ICD-10-CM | POA: Insufficient documentation

## 2012-05-02 LAB — POCT URINALYSIS DIP (DEVICE)
Bilirubin Urine: NEGATIVE
Glucose, UA: NEGATIVE mg/dL
Ketones, ur: NEGATIVE mg/dL
Leukocytes, UA: NEGATIVE
Nitrite: NEGATIVE

## 2012-05-02 MED ORDER — HYDROXYPROGESTERONE CAPROATE 250 MG/ML IM OIL: 250.0000 mg | TOPICAL_OIL | Freq: Once | INTRAMUSCULAR | Status: DC

## 2012-05-02 MED ORDER — FLUOXETINE HCL 20 MG PO CAPS: 20.0000 mg | ORAL_CAPSULE | Freq: Every day | ORAL | Status: DC

## 2012-05-02 MED ORDER — ABDOMINAL BINDER/ELASTIC 2XL MISC: 1.0000 | Freq: Every morning | Status: DC

## 2012-05-02 NOTE — Progress Notes (Signed)
Pulse 95 Vaginal d/c stated as thin clear; no itch, no odor.

## 2012-05-02 NOTE — Progress Notes (Signed)
   Subjective:    Claudia Gonzales is a G2P0101 [redacted]w[redacted]d being seen today for her first obstetrical visit.  Her obstetrical history is significant for late prenatal care.H/O PTB  Patient does intend to breast feed. Pregnancy history fully reviewed.  Patient reports no complaints.  Filed Vitals:   05/02/12 1003  BP: 109/70  Temp: 97.3 F (36.3 C)  Weight: 230 lb (104.327 kg)    HISTORY: OB History    Grav Para Term Preterm Abortions TAB SAB Ect Mult Living   2 1  1      1      # Outc Date GA Lbr Len/2nd Wgt Sex Del Anes PTL Lv   1 PRE 10/11 [redacted]w[redacted]d  2lb7oz(1.106kg) M SVD EPI Yes Yes   2 CUR              Past Medical History  Diagnosis Date  . Asthma   . Preterm labor   . Anxiety    Past Surgical History  Procedure Date  . Appendectomy   . Wisdom tooth extraction    Family History  Problem Relation Age of Onset  . Hypertension Mother   . Cancer Father   . Cancer Maternal Grandmother   . Cancer Maternal Grandfather   . Cancer Paternal Grandmother   . Cancer Paternal Grandfather      Exam    Uterus:     Pelvic Exam:    Perineum: No Hemorrhoids   Vulva: normal   Vagina:  normal mucosa   pH:    Cervix: no lesions   Adnexa: no mass, fullness, tenderness   Bony Pelvis: average  System: Breast:  normal appearance, no masses or tenderness   Skin: normal coloration and turgor, no rashes    Neurologic: oriented, normal   Extremities: normal strength, tone, and muscle mass   HEENT sclera clear, anicteric and neck supple with midline trachea   Mouth/Teeth mucous membranes moist, pharynx normal without lesions   Neck supple   Cardiovascular: regular rate and rhythm, no murmurs or gallops   Respiratory:  appears well, vitals normal, no respiratory distress, acyanotic, normal RR, neck free of mass or lymphadenopathy, chest clear, no wheezing, crepitations, rhonchi, normal symmetric air entry   Abdomen: soft, non-tender; bowel sounds normal; no masses,  no organomegaly   Urinary: urethral meatus normal      Assessment:    Pregnancy: [id number] Patient Active Problem List  Diagnosis  . Normal IUP (intrauterine pregnancy) on prenatal ultrasound  . Nausea/vomiting in pregnancy  . H/O premature delivery  . Anxiety and depression        Plan:     Initial labs drawn. Prenatal vitamins. Problem list reviewed and updated. Genetic Screening discussed too late, had normal anatomy scan  Ultrasound discussed; fetal survey: results reviewed.  Follow up in 1 weeks. 50% of 30 min visit spent on counseling and coordination of care.  H/O PTB 30 weeks, will start 17 P and return weekly for injection   Kalib Bhagat 05/02/2012

## 2012-05-02 NOTE — Patient Instructions (Signed)
Preterm Birth Preterm birth is a birth which happens before 37 weeks of pregnancy. Most pregnancies last about 39 to 41 weeks. Every week in the womb is important and is beneficial to the health of the infant. Infants born before 37 weeks of pregnancy are at a higher risk for complications. Depending on when the infant was born, he or she may be:  Late preterm. Born between 32 and 37 weeks of pregnancy.  Very preterm. Born at less than 32 weeks of pregnancy.  Extremely preterm. Born at less than 25 weeks of pregnancy. The earlier a baby is born, the more likely the child will have issues related to prematurity. Complications and problems that can be seen in infants born too early include:  Problems breathing (respiratory distress syndrome).  Low birth weight.  Problems feeding.  Sleeping problems.  Yellowing of the skin (jaundice).  Infections such as pneumonia. Babies that are born very preterm or extremely preterm are at risk for more serious medical issues. These include:  Breathing issues.  Eyesight issues.  Brain development issues (intraventricular hemorrhage).  Behavioral and emotional development issues.  Growth and developmental delays.  Cerebral palsy.  Serious feeding or bowel complications (necrotizing enterocolitis). CAUSES  There are 2 broad categories of preterm birth.  Spontaneous preterm birth. This is a birth resulting from preterm labor (not medically induced) or preterm premature rupture of membranes (PPROM).  Indicated preterm birth. This is a birth resulting from labor being medically induced due to health, personal, or social reasons. Preterm birth may be related to certain medical conditions, lifestyle factors, or demographic factors encountered by the mother or fetus.   Medical conditions include:  Multiple gestations (twins, triplets, and so on).  Infection.  Diabetes.  Heart disease.  Kidney disease.  Cervical or uterine  abnormalities.  Being underweight.  High blood pressure or preeclampsia.  Premature rupture of membranes (PROM).  Birth defects in the fetus.  Lifestyle factors include:  Poor prenatal care.  Poor nutrition or anemia.  Cigarette smoking.  Consuming alcohol.  High levels of stress and lack of social or emotional support.  Exposure to chemical or environmental toxins.  Substance abuse.  Demographic factors include:  African-American ethnicity.  Age (younger than 18 or older than 35).  Low socioeconomic status. Women with a history of preterm labor or who become pregnant within 18 months of giving birth are also at increased risk for preterm birth. DIAGNOSIS  Your caregiver may request additional tests to diagnose underlying complications resulting from preterm birth. Tests on the infant may include:  Physical exam.  Blood tests.  Chest X-rays.  Heart-lung monitoring. PREVENTION There are some things you can do to help lower your risk of having a preterm infant in the future. These include:  Good prenatal care throughout the entire pregnancy. See a caregiver regularly for advice and tests.  Management of underlying medical conditions.  Proper self-care and lifestyle changes.  Proper diet and weight control.  Watching for signs of various infections. TREATMENT  After birth, special care will be taken to assess any problems or complications of the infant. Supportive care will be provided for the infant. Treatment depends on what problems are present and any complications that develop. Some preterm infants are cared for in a neonatal intensive care unit. In general, care may include:  Maintaining temperature and oxygen in a clear heated box (baby isolette).  Monitoring the infant's heart rate, breathing, and level of oxygen in the blood.  Monitoring for signs of   infection and, if needed, giving intravenous (IV) antibiotic medicine.  Inserting a feeding tube  (nose, mouth) or giving IV nutrition if unable to feed.  Inserting a breathing tube (ventilation).  Respiration support (continuous positive airway pressure [CPAP] or oxygen). Treatment will change as the infant builds up strength and is able to breathe and eat on his or her own. For some infants, no special treatment is necessary. Parents may be educated on the potential health risks of prematurity to the infant. HOME CARE INSTRUCTIONS  Understand your infant's special conditions and needs. It may be reassuring to learn about infant CPR.  Monitor your infant in the car seat until he or she grows and matures. Infant car seats can cause breathing difficulties for preterm infants.  Keep your infant warm. Dress your infant in layers and keep him or her away from drafts, especially in cold months of the year.  Wash your hands thoroughly after going to the bathroom or changing a diaper. Late preterm infants may be more prone to infection.  Follow all your caregiver's instructions for providing support and care to your preterm infant.  Get support from organizations and groups that understand your challenges.  Follow up with your infant's caregiver as directed. SEEK MEDICAL CARE IF:  Your infant has feeding difficulties.  Your infant has sleeping difficulties.  Your infant has breathing difficulties.  Your infant's skin starts to look yellow (jaundice).  Your infant shows signs of infection like a stuffy nose, fever, crying, or bluish color of the skin. FOR MORE INFORMATION March of D[employer]: www.marchofdimes.com Prematurity.org: www.prematurity.org Document Released: 06/13/2003 Document Revised: 06/15/2011 Document Reviewed: 08/15/2009 Exit[employer] Patient Information 2013 [employer].  

## 2012-05-03 LAB — ANTIBODY SCREEN: Antibody Screen: NEGATIVE

## 2012-05-04 ENCOUNTER — Inpatient Hospital Stay (HOSPITAL_COMMUNITY)
Admission: AD | Admit: 2012-05-04 | Discharge: 2012-05-04 | Disposition: A | Discharge status: Home / Self Care | Payer: Medicaid Other | Source: Ambulatory Visit | Attending: Obstetrics & Gynecology | Admitting: Obstetrics & Gynecology

## 2012-05-04 ENCOUNTER — Encounter (HOSPITAL_COMMUNITY): Payer: Self-pay | Admitting: *Deleted

## 2012-05-04 DIAGNOSIS — J069 Acute upper respiratory infection, unspecified: Secondary | ICD-10-CM

## 2012-05-04 DIAGNOSIS — O99891 Other specified diseases and conditions complicating pregnancy: Secondary | ICD-10-CM | POA: Insufficient documentation

## 2012-05-04 DIAGNOSIS — J029 Acute pharyngitis, unspecified: Secondary | ICD-10-CM | POA: Insufficient documentation

## 2012-05-04 HISTORY — DX: Chlamydial infection, unspecified: A74.9

## 2012-05-04 MED ORDER — PSEUDOEPHEDRINE HCL 30 MG PO TABS: 60.0000 mg | ORAL_TABLET | ORAL | Status: DC | PRN

## 2012-05-04 MED ORDER — ACETAMINOPHEN 325 MG PO TABS: 650.0000 mg | ORAL_TABLET | Freq: Four times a day (QID) | ORAL | Status: DC | PRN

## 2012-05-04 MED ORDER — GUAIFENESIN ER 600 MG PO TB12: 1200.0000 mg | ORAL_TABLET | Freq: Two times a day (BID) | ORAL | Status: DC

## 2012-05-04 NOTE — MAU Note (Addendum)
Upper respiratory sxs started yesterday: sore throat, congestion, cough, hurts in chest and back when coughing. Sometimes vomits when coughing real bad.

## 2012-05-04 NOTE — MAU Provider Note (Signed)
History     CSN: [id number]  Arrival date and time: 05/04/12 1535   First Provider Initiated Contact with Patient 05/04/12 1615      Chief Complaint  Patient presents with  . Back Pain  . Cold sxs    HPI This is a 22 y.o. at [redacted]w[redacted]d who presents with c/o cold symptoms. States has sore throat, nasal congestion, and cough. Symptoms started yesterday. Has tried no meds for this. States is living at [facility] and "they won't let me take Tylenol without a doctors note".   RN Note: Upper respiratory sxs started yesterday: sore throat, congestion, cough, hurts in chest and back when coughing. Sometimes vomits when coughing real bad.  Revision History...      Date/Time User Action    05/04/2012 3:56 PM Arvil Persons, RN Addend    05/04/2012 3:54 PM Arvil Persons, RN Sign   View Details Report      OB History    Grav Para Term Preterm Abortions TAB SAB Ect Mult Living   2 1  1      1       Past Medical History  Diagnosis Date  . Asthma   . Preterm labor   . Anxiety   . Chlamydia     Past Surgical History  Procedure Date  . Appendectomy   . Wisdom tooth extraction     Family History  Problem Relation Age of Onset  . Hypertension Mother   . Cancer Father   . Cancer Maternal Grandmother   . Cancer Maternal Grandfather   . Cancer Paternal Grandmother   . Cancer Paternal Grandfather     History  Substance Use Topics  . Smoking status: Former Smoker -- 0.5 packs/day for 1 years    Types: Cigarettes  . Smokeless tobacco: Never Used  . Alcohol Use: No    Allergies: No Known Allergies  Prescriptions prior to admission  Medication Sig Dispense Refill  . FLUoxetine (PROZAC) 20 MG capsule Take 1 capsule (20 mg total) by mouth daily.  30 capsule  3  . omeprazole (PRILOSEC) 20 MG capsule Take 20 mg by mouth 2 (two) times daily.      . Prenatal Vit-Fe Fumarate-FA (PRENATAL VITAMINS PLUS) 27-1 MG TABS Take 1 tablet by mouth daily.      . Elastic Bandages &  Supports (ABDOMINAL BINDER/ELASTIC 2XL) MISC 1 Device by Does not apply route every morning.  1 each  0    Review of Systems  Constitutional: Positive for malaise/fatigue. Negative for fever and chills.  HENT: Positive for congestion and sore throat.   Respiratory: Positive for cough. Negative for shortness of breath and wheezing.   Gastrointestinal: Negative for nausea, vomiting, abdominal pain, diarrhea and constipation.  Neurological: Negative for weakness and headaches.   Physical Exam   Blood pressure 110/58, pulse 90, temperature 98.9 F (37.2 C), temperature source Oral, resp. rate 18, height 5' 6.5" (1.689 m), weight 233 lb 12.8 oz (106.051 kg), last menstrual period 11/14/2011, SpO2 99.00%.  Physical Exam  Constitutional: She is oriented to person, place, and time. She appears well-developed and well-nourished. No distress.  HENT:  Head: Normocephalic.  Cardiovascular: Normal rate, regular rhythm and normal heart sounds.   Respiratory: Effort normal and breath sounds normal. No respiratory distress. She has no wheezes. She has no rales. She exhibits no tenderness.  GI: Soft. She exhibits no distension and no mass. There is no tenderness. There is no rebound and no guarding.  Musculoskeletal: Normal range of motion.  Neurological: She is alert and oriented to person, place, and time.  Skin: Skin is warm and dry.  Psychiatric: She has a normal mood and affect.    MAU Course  Procedures  Assessment and Plan  A:  SIUP at [redacted]w[redacted]d       Upper Respiratory Infection, no fever  P:  Discharge home       Rx Cold meds       Followup in office   Medication List     As of 05/04/2012  7:54 PM    START taking these medications         acetaminophen 325 MG tablet   Commonly known as: TYLENOL   Take 2 tablets (650 mg total) by mouth every 6 (six) hours as needed for pain.      guaiFENesin 600 MG 12 hr tablet   Commonly known as: MUCINEX   Take 2 tablets (1,200 mg total) by  mouth 2 (two) times daily.      pseudoephedrine 30 MG tablet   Commonly known as: SUDAFED   Take 2 tablets (60 mg total) by mouth every 4 (four) hours as needed for congestion.      CONTINUE taking these medications         Abdominal Binder/Elastic 2XL Misc   1 Device by Does not apply route every morning.      FLUoxetine 20 MG capsule   Commonly known as: PROZAC   Take 1 capsule (20 mg total) by mouth daily.      omeprazole 20 MG capsule   Commonly known as: PRILOSEC      PRENATAL VITAMINS PLUS 27-1 MG Tabs          Where to get your medications    These are the prescriptions that you need to pick up. We sent them to a specific pharmacy, so you will need to go there to get them.   [facility] - [place], [place] - [address]    [address] [place] [address]    Phone: [phone]        acetaminophen 325 MG tablet   guaiFENesin 600 MG 12 hr tablet   pseudoephedrine 30 MG tablet             Keawe Marcello 05/04/2012, 4:32 PM

## 2012-05-09 ENCOUNTER — Ambulatory Visit: Payer: Medicaid Other | Admitting: Obstetrics and Gynecology

## 2012-05-09 VITALS — BP 120/66 | HR 82 | Wt 230.0 lb

## 2012-05-16 ENCOUNTER — Other Ambulatory Visit (HOSPITAL_COMMUNITY)
Admission: RE | Admit: 2012-05-16 | Discharge: 2012-05-16 | Disposition: A | Discharge status: Home / Self Care | Payer: Medicaid Other | Source: Ambulatory Visit | Attending: Obstetrics & Gynecology | Admitting: Obstetrics & Gynecology

## 2012-05-16 ENCOUNTER — Ambulatory Visit (INDEPENDENT_AMBULATORY_CARE_PROVIDER_SITE_OTHER): Payer: Medicaid Other | Admitting: Obstetrics & Gynecology

## 2012-05-16 VITALS — BP 115/76 | Temp 97.5°F | Wt 223.0 lb

## 2012-05-16 DIAGNOSIS — N760 Acute vaginitis: Secondary | ICD-10-CM | POA: Insufficient documentation

## 2012-05-16 DIAGNOSIS — Z8751 Personal history of pre-term labor: Secondary | ICD-10-CM

## 2012-05-16 DIAGNOSIS — O09219 Supervision of pregnancy with history of pre-term labor, unspecified trimester: Secondary | ICD-10-CM

## 2012-05-16 LAB — POCT URINALYSIS DIP (DEVICE)
Bilirubin Urine: NEGATIVE
Glucose, UA: NEGATIVE mg/dL
Nitrite: NEGATIVE
Urobilinogen, UA: 1 mg/dL (ref 0.0–1.0)

## 2012-05-16 MED ORDER — HYDROXYPROGESTERONE CAPROATE 250 MG/ML IM OIL
250.0000 mg | TOPICAL_OIL | Freq: Once | INTRAMUSCULAR | Status: AC
  Administered 2012-05-16: 250 mg via INTRAMUSCULAR

## 2012-05-16 NOTE — Progress Notes (Signed)
Pulse 91. No c/o pain; pressure in vaginal area. Vaginal d/c as yellow no itch, with odor.

## 2012-05-16 NOTE — Patient Instructions (Signed)

## 2012-05-16 NOTE — Progress Notes (Signed)
Will get first 17 P inj today. Discharge may be physiologic but wet prep done.

## 2012-05-19 ENCOUNTER — Encounter (HOSPITAL_COMMUNITY): Payer: Self-pay

## 2012-05-19 ENCOUNTER — Inpatient Hospital Stay (HOSPITAL_COMMUNITY)
Admission: AD | Admit: 2012-05-19 | Discharge: 2012-05-19 | Disposition: A | Discharge status: Home / Self Care | Payer: Medicaid Other | Source: Ambulatory Visit | Attending: Family Medicine | Admitting: Family Medicine

## 2012-05-19 DIAGNOSIS — O99891 Other specified diseases and conditions complicating pregnancy: Secondary | ICD-10-CM | POA: Insufficient documentation

## 2012-05-19 DIAGNOSIS — O212 Late vomiting of pregnancy: Secondary | ICD-10-CM | POA: Insufficient documentation

## 2012-05-19 DIAGNOSIS — O479 False labor, unspecified: Secondary | ICD-10-CM

## 2012-05-19 DIAGNOSIS — R1084 Generalized abdominal pain: Secondary | ICD-10-CM

## 2012-05-19 DIAGNOSIS — R109 Unspecified abdominal pain: Secondary | ICD-10-CM | POA: Insufficient documentation

## 2012-05-19 DIAGNOSIS — A084 Viral intestinal infection, unspecified: Secondary | ICD-10-CM

## 2012-05-19 DIAGNOSIS — A088 Other specified intestinal infections: Secondary | ICD-10-CM | POA: Insufficient documentation

## 2012-05-19 LAB — URINE MICROSCOPIC-ADD ON

## 2012-05-19 LAB — URINALYSIS, ROUTINE W REFLEX MICROSCOPIC
Glucose, UA: NEGATIVE mg/dL
Specific Gravity, Urine: >1.03 — ABNORMAL HIGH (ref 1.005–1.030)
pH: 6 (ref 5.0–8.0)

## 2012-05-19 MED ORDER — ONDANSETRON 4 MG PO TBDP: 4.0000 mg | ORAL_TABLET | Freq: Four times a day (QID) | ORAL | Status: DC | PRN

## 2012-05-19 MED ORDER — FAMOTIDINE IN NACL 20-0.9 MG/50ML-% IV SOLN: 20.0000 mg | Freq: Two times a day (BID) | INTRAVENOUS | Status: DC | PRN

## 2012-05-19 MED ORDER — PROMETHAZINE HCL 25 MG/ML IJ SOLN
25.0000 mg | Freq: Four times a day (QID) | INTRAMUSCULAR | Status: DC | PRN
  Administered 2012-05-19: 25 mg via INTRAVENOUS
  Filled 2012-05-19: qty 1

## 2012-05-19 MED ORDER — ONDANSETRON HCL 4 MG/2ML IJ SOLN
4.0000 mg | Freq: Once | INTRAMUSCULAR | Status: AC
  Administered 2012-05-19: 4 mg via INTRAVENOUS
  Filled 2012-05-19: qty 2

## 2012-05-19 MED ORDER — LACTATED RINGERS IV BOLUS (SEPSIS)
1000.0000 mL | Freq: Once | INTRAVENOUS | Status: AC
  Administered 2012-05-19: 1000 mL via INTRAVENOUS

## 2012-05-19 MED ORDER — FAMOTIDINE 20 MG PO TABS: 20.0000 mg | ORAL_TABLET | Freq: Two times a day (BID) | ORAL | Status: DC | PRN

## 2012-05-19 MED ORDER — FAMOTIDINE IN NACL 20-0.9 MG/50ML-% IV SOLN
20.0000 mg | Freq: Once | INTRAVENOUS | Status: AC
  Administered 2012-05-19: 20 mg via INTRAVENOUS
  Filled 2012-05-19: qty 50

## 2012-05-19 MED ORDER — ONDANSETRON 4 MG PO TBDP
4.0000 mg | ORAL_TABLET | Freq: Once | ORAL | Status: AC
  Administered 2012-05-19: 4 mg via ORAL
  Filled 2012-05-19: qty 1

## 2012-05-19 NOTE — Progress Notes (Signed)
Dr ferry notified of patient, her presenting complaints,. Order received for 4mf ODT zofran po once.

## 2012-05-19 NOTE — MAU Provider Note (Signed)
History     CSN: [id number]  Arrival date and time: 05/19/12 0946   None     Chief Complaint  Patient presents with  . Nausea  . Emesis  . Diarrhea  . Abdominal Cramping   HPI 22 y.o. G2P0101 at [redacted]w[redacted]d with nausea, vomiting, diarrhea since last night. 7 BMs since 7 am this morning. Abdominal pain/cramping. No fever/chills. Emesis x 4. Unable to keep fluids down. Nausea. Last food 6 pm. No sick contacts.  OB History   Grav Para Term Preterm Abortions TAB SAB Ect Mult Living   2 1  1      1       Past Medical History  Diagnosis Date  . Asthma   . Preterm labor   . Anxiety   . Chlamydia     Past Surgical History  Procedure Laterality Date  . Appendectomy    . Wisdom tooth extraction      Family History  Problem Relation Age of Onset  . Hypertension Mother   . Cancer Father   . Cancer Maternal Grandmother   . Cancer Maternal Grandfather   . Cancer Paternal Grandmother   . Cancer Paternal Grandfather     History  Substance Use Topics  . Smoking status: Former Smoker -- 0.50 packs/day for 1 years    Types: Cigarettes  . Smokeless tobacco: Never Used  . Alcohol Use: No    Allergies: No Known Allergies  Prescriptions prior to admission  Medication Sig Dispense Refill  . FLUoxetine (PROZAC) 20 MG capsule Take 1 capsule (20 mg total) by mouth daily.  30 capsule  3  . Prenatal Vit-Fe Fumarate-FA (PRENATAL VITAMINS PLUS) 27-1 MG TABS Take 1 tablet by mouth daily.        Review of Systems  Constitutional: Negative for fever and chills.  Eyes: Negative for blurred vision and double vision.  Respiratory: Negative for shortness of breath.   Gastrointestinal: Positive for nausea, vomiting, abdominal pain and diarrhea. Negative for blood in stool.  Genitourinary: Negative for dysuria.  Neurological: Negative for dizziness and headaches.   Physical Exam   Blood pressure 121/68, pulse 91, temperature 98.6 F (37 C), temperature source Oral, resp. rate 18, last  menstrual period 11/14/2011, SpO2 100.00%.  Physical Exam  Constitutional: She is oriented to person, place, and time. She appears well-developed and well-nourished. No distress.  HENT:  Head: Normocephalic and atraumatic.  Mucous membranes dry.  Eyes: Conjunctivae and EOM are normal.  Neck: Normal range of motion. Neck supple.  Cardiovascular: Normal rate, regular rhythm and normal heart sounds.   Respiratory: Effort normal and breath sounds normal. No respiratory distress.  GI: Soft. She exhibits no distension.  Musculoskeletal: Normal range of motion. She exhibits no edema and no tenderness.  Neurological: She is alert and oriented to person, place, and time.  Skin: Skin is warm and dry.  Psychiatric: She has a normal mood and affect.    FHTs:  Baseline 135, moderate variability, accels present, occasional decel TOCO:  No contractions  Results for orders placed during the hospital encounter of 05/19/12 (from the past 24 hour(s))  URINALYSIS, ROUTINE W REFLEX MICROSCOPIC     Status: Abnormal   Collection Time    05/19/12 10:02 AM      Result Value Range   Color, Urine YELLOW  YELLOW   APPearance HAZY (*) CLEAR   Specific Gravity, Urine >1.030 (*) 1.005 - 1.030   pH 6.0  5.0 - 8.0   Glucose, UA NEGATIVE  NEGATIVE mg/dL   Hgb urine dipstick TRACE (*) NEGATIVE   Bilirubin Urine NEGATIVE  NEGATIVE   Ketones, ur NEGATIVE  NEGATIVE mg/dL   Protein, ur NEGATIVE  NEGATIVE mg/dL   Urobilinogen, UA 0.2  0.0 - 1.0 mg/dL   Nitrite NEGATIVE  NEGATIVE   Leukocytes, UA MODERATE (*) NEGATIVE  URINE MICROSCOPIC-ADD ON     Status: Abnormal   Collection Time    05/19/12 10:02 AM      Result Value Range   Squamous Epithelial / LPF RARE  RARE   WBC, UA 3-6  <3 WBC/hpf   RBC / HPF 0-2  <3 RBC/hpf   Bacteria, UA FEW (*) RARE    MAU Course  Procedures  1 liter LR, 4 mg IV zofran and 25 mg phenergan IV given in ED  Assessment and Plan  22 y.o. G2P0101 at [redacted]w[redacted]d with Viral  gastroenteritis - Discharge home with zofran, bland diet until feeling better. - Labor precautions discussed. - F/U 2/17 for 17-p, 2/24 for prenatal visit [facility])   Napoleon Form 05/19/2012, 10:59 AM

## 2012-05-20 LAB — URINE CULTURE

## 2012-05-21 NOTE — MAU Note (Signed)
Ms. Alvelo boyfriend called stating that Ms. Ednamae was with him currently at the [facility]. He stated that Ellinore was seen in MAU this past week and was diagnosed with a stomach virus. She was prescribed medication but did not get it filled. [facility] and he could not give a clear reason why the medication was not filled. I asked for the [facility] number and her name so that I could call and get clarification. He provided the information and I assured him I would call him back with some suggestions.    I spoke to a women [facility] who said she had no idea the patient was prescribed the medication and said they will get it filled for her today.   During the call to [facility], Ms. Vernel's boyfriend called back and spoke to TARA NT saying that they decided they did not want [facility] knowing about the medication. We notified them that we had already called and spoke to them and they would have the medication filled today.

## 2012-05-23 ENCOUNTER — Inpatient Hospital Stay (HOSPITAL_COMMUNITY)
Admission: AD | Admit: 2012-05-23 | Discharge: 2012-05-23 | Disposition: A | Discharge status: Home / Self Care | Payer: Medicaid Other | Source: Ambulatory Visit | Attending: Obstetrics & Gynecology | Admitting: Obstetrics & Gynecology

## 2012-05-23 ENCOUNTER — Ambulatory Visit (INDEPENDENT_AMBULATORY_CARE_PROVIDER_SITE_OTHER): Payer: Medicaid Other | Admitting: Advanced Practice Midwife

## 2012-05-23 ENCOUNTER — Encounter (INDEPENDENT_AMBULATORY_CARE_PROVIDER_SITE_OTHER): Payer: Medicaid Other | Admitting: Obstetrics & Gynecology

## 2012-05-23 ENCOUNTER — Encounter: Payer: Self-pay | Admitting: Family Medicine

## 2012-05-23 ENCOUNTER — Encounter (HOSPITAL_COMMUNITY): Payer: Self-pay | Admitting: *Deleted

## 2012-05-23 VITALS — BP 111/66 | Temp 98.8°F | Wt 224.3 lb

## 2012-05-23 DIAGNOSIS — Z8751 Personal history of pre-term labor: Secondary | ICD-10-CM

## 2012-05-23 DIAGNOSIS — W101XXA Fall (on)(from) sidewalk curb, initial encounter: Secondary | ICD-10-CM | POA: Insufficient documentation

## 2012-05-23 DIAGNOSIS — S3991XA Unspecified injury of abdomen, initial encounter: Secondary | ICD-10-CM

## 2012-05-23 DIAGNOSIS — O9A219 Injury, poisoning and certain other consequences of external causes complicating pregnancy, unspecified trimester: Secondary | ICD-10-CM

## 2012-05-23 DIAGNOSIS — O9989 Other specified diseases and conditions complicating pregnancy, childbirth and the puerperium: Secondary | ICD-10-CM

## 2012-05-23 DIAGNOSIS — R109 Unspecified abdominal pain: Secondary | ICD-10-CM | POA: Insufficient documentation

## 2012-05-23 DIAGNOSIS — O219 Vomiting of pregnancy, unspecified: Secondary | ICD-10-CM

## 2012-05-23 DIAGNOSIS — Y92410 Unspecified street and highway as the place of occurrence of the external cause: Secondary | ICD-10-CM | POA: Insufficient documentation

## 2012-05-23 DIAGNOSIS — M549 Dorsalgia, unspecified: Secondary | ICD-10-CM | POA: Insufficient documentation

## 2012-05-23 DIAGNOSIS — O09219 Supervision of pregnancy with history of pre-term labor, unspecified trimester: Secondary | ICD-10-CM

## 2012-05-23 DIAGNOSIS — O99891 Other specified diseases and conditions complicating pregnancy: Secondary | ICD-10-CM | POA: Insufficient documentation

## 2012-05-23 LAB — URINALYSIS, ROUTINE W REFLEX MICROSCOPIC
Glucose, UA: NEGATIVE mg/dL
Hgb urine dipstick: NEGATIVE
Leukocytes, UA: NEGATIVE
Specific Gravity, Urine: 1.025 (ref 1.005–1.030)
Urobilinogen, UA: 1 mg/dL (ref 0.0–1.0)

## 2012-05-23 MED ORDER — ACETAMINOPHEN 500 MG PO TABS
1000.0000 mg | ORAL_TABLET | Freq: Once | ORAL | Status: AC
  Administered 2012-05-23: 1000 mg via ORAL
  Filled 2012-05-23: qty 2

## 2012-05-23 MED ORDER — HYDROXYPROGESTERONE CAPROATE 250 MG/ML IM OIL
250.0000 mg | TOPICAL_OIL | Freq: Once | INTRAMUSCULAR | Status: AC
  Administered 2012-05-23: 250 mg via INTRAMUSCULAR

## 2012-05-23 NOTE — Progress Notes (Signed)
CSW met with patient who states inadequate food at [facility].  She states when she first arrived, they were fed hotdogs almost every night and not serving breakfast or dinner.  She states she has lost 10 lbs and an RN in the clinic was concerned about this and told her to speak to a Child psychotherapist.  CSW asked if she has spoken to [facility]  staff and she states all the women there complain about the situation.  CSW has not heard of this complaint prior to this from other women who have been in this program.  CSW thanked patient for stating her concern, but informed her that CSW may not be able to change the situation, but will contact [facility]  staff to discuss the concern.  FOB was with MOB in room and CSW asked where he lives.  He states he lives with a friend and that it is "up to her (MOB)" as to whether she wants to stay there also.  CSW asked MOB if she thinks this would be a good option for her and she states they are trying to find affordable housing on their own.  CSW explained that CSW is not recommending for patient to leave [facility] , but encouraged her to think about any other options she might have if she is unhappy with the program.  CSW left a message at [facility]  requesting a call back.  CSW will follow up.

## 2012-05-23 NOTE — Progress Notes (Signed)
Pt fell in snow this morning, states she landed on her hands and knees and slid onto her belly, about 1 hour ago. . Some pelvic pain immediately after fall, but no pain or bleeding at this time. No fetal movement since the fall. Pt will go to MAU for monitoring.

## 2012-05-23 NOTE — MAU Note (Signed)
Pt states she still feels dehydrated from stomach virus she had previously, has abd pain & nausea @ times.

## 2012-05-23 NOTE — MAU Note (Signed)
Social worker informed of pt's request to speak with her regarding her food situation.  States she doesn't feel like she gets enough good at [facility] & that she has been losing weight.

## 2012-05-23 NOTE — MAU Note (Signed)
Around 10 this morning, fell in the snow, landed on knees and stomach.  Denies any pain or bleeding.  Baby is moving now.  Reports feeling lightheaded now.  Has not ate or drank anything today.

## 2012-05-23 NOTE — Progress Notes (Signed)
P=87 . Here initially for shot, but reports she fell outside in the snow- fell down on hands and knees.States when fell felt pain in pelvic area,

## 2012-05-23 NOTE — Addendum Note (Signed)
Addended byRaynald Blend L on: 05/23/2012 11:10 AM   Modules accepted: Orders

## 2012-05-23 NOTE — Progress Notes (Signed)
Vaginal  Specimen pos for BV, recommend treatment with flagyl 2 grams single dose

## 2012-05-23 NOTE — MAU Provider Note (Signed)
Attestation of Attending Supervision of Advanced Practitioner (CNM/NP): Evaluation and management procedures were performed by the Advanced Practitioner under my supervision and collaboration.  I have reviewed the Advanced Practitioner's note and chart, and I agree with the management and plan.  HARRAWAY-SMITH, Abisai Coble 5:34 PM

## 2012-05-23 NOTE — MAU Provider Note (Signed)
History     CSN: [id number]  Arrival date and time: 05/23/12 1116   None     Chief Complaint  Patient presents with  . Fall   HPI 22 y/o female presenting after a fall this morning (10:30 AM) when she was waiting for the bus to come to clinic. She slipped on the curb an landed on a snow bank on both of her knees and hands and then slid to her belly. She had some immediate lower abdominal pain which lasted about 30-35 minutes described as a 7.5/10 pressure like pain. She denies bleeding, loss of fluid, or change in vaginal discharge, . The baby is moving normally. She is also having some back pain that started after she sat down on the bus that worsens with particular movements.    She would also like to speak to a Child psychotherapist as she is living at a shelter and does not feel that she is getting enough to eat sometimes and has recently lost some weight.   OB History   Grav Para Term Preterm Abortions TAB SAB Ect Mult Living   2 1  1      1       Past Medical History  Diagnosis Date  . Asthma   . Preterm labor   . Anxiety   . Chlamydia     Past Surgical History  Procedure Laterality Date  . Appendectomy    . Wisdom tooth extraction      Family History  Problem Relation Age of Onset  . Hypertension Mother   . Cancer Father   . Cancer Maternal Grandmother   . Cancer Maternal Grandfather   . Cancer Paternal Grandmother   . Cancer Paternal Grandfather     History  Substance Use Topics  . Smoking status: Former Smoker -- 0.50 packs/day for 1 years    Types: Cigarettes  . Smokeless tobacco: Never Used  . Alcohol Use: No    Allergies: No Known Allergies  Prescriptions prior to admission  Medication Sig Dispense Refill  . famotidine (PEPCID) 20 MG tablet Take 1 tablet (20 mg total) by mouth 2 (two) times daily as needed for heartburn.  60 tablet  0  . FLUoxetine (PROZAC) 20 MG capsule Take 1 capsule (20 mg total) by mouth daily.  30 capsule  3  . ondansetron  (ZOFRAN ODT) 4 MG disintegrating tablet Take 1 tablet (4 mg total) by mouth every 6 (six) hours as needed for nausea.  20 tablet  0  . Prenatal Vit-Fe Fumarate-FA (PRENATAL VITAMINS PLUS) 27-1 MG TABS Take 1 tablet by mouth daily.        ROS Per HPI  Physical Exam   Blood pressure 121/64, pulse 79, temperature 98.2 F (36.8 C), temperature source Oral, resp. rate 18, weight 102.059 kg (225 lb), last menstrual period 11/14/2011, SpO2 100.00%.  Physical Exam Gen: NAD, alert, cooperative with exam HEENT: NCAT,  moist oral mucosa CV: RRR, good S1/S2, no murmur Resp: CTABL, no wheezes, non-labored Abd: S oft pregnant abdomen, mild tendernes to palplation in groin area when the top of her fundus is palpated, no guarding or rebound Ext: No edema, no lesions on BL knees and full ROM MSK: No bony tenderness of thoracic or lumbar spine to palpation and no paraspinal tenderness to palpation Neuro: Alert and oriented, No gross deficits  MAU Course  Procedures  MDM UA without signs of infection  Reassuring fetal strip  Assessment and Plan  22 y/o G2P0101 here after  a fall this am presenting from clinic after her 17P injection.   - Monitor for 4 hours post fall - Labor precautions discussed - F/U in [facility] as scheduled.  Kevin Fenton 05/23/2012, 12:38 PM   I have seen and examined patient and agree with above resident note. Four hours after fall, and after monitoring for 2.5 hours, patient denies pain, contractions, bleeding. Baby still moving well. Discharge home with precautions regarding contractions/bleeding/preterm labor/fetal movement (kick counts). F/U 2/24 in [facility].  Napoleon Form, MD

## 2012-05-23 NOTE — MAU Provider Note (Signed)
Chart reviewed and agree with management and plan.  

## 2012-05-24 ENCOUNTER — Telehealth: Payer: Self-pay | Admitting: *Deleted

## 2012-05-24 DIAGNOSIS — N760 Acute vaginitis: Secondary | ICD-10-CM

## 2012-05-24 DIAGNOSIS — B9689 Other specified bacterial agents as the cause of diseases classified elsewhere: Secondary | ICD-10-CM

## 2012-05-24 MED ORDER — METRONIDAZOLE 500 MG PO TABS: 2000.0000 mg | ORAL_TABLET | Freq: Once | ORAL | Status: DC

## 2012-05-24 NOTE — Telephone Encounter (Signed)
Called patient, no answer. Left her a message to call back. Rx sent to pharmacy.

## 2012-05-24 NOTE — Telephone Encounter (Signed)
Message copied by Mannie Stabile on Tue May 24, 2012  1:50 PM ------      Message from: Adam Phenix      Created: Mon May 23, 2012  1:15 PM       Positive BV, Rx Flagyl 2 grams PO single dose ------

## 2012-05-25 NOTE — Telephone Encounter (Signed)
Called patient and informed her of BV and Rx sent to [facility] on [address]. Patient verbalized understanding. Patient stated she would like to have a Child psychotherapist call her today if possible. I spoke to Lulu Riding and told her of patient's request, Jill Side stated she will call the patient shortly. Relayed the information to the patient. Patient verbalized understanding and had no further questions.

## 2012-05-25 NOTE — Progress Notes (Signed)
Changed from shot visit to obfu visit due to fall

## 2012-05-30 ENCOUNTER — Encounter: Payer: Medicaid Other | Admitting: Family Medicine

## 2012-05-31 NOTE — Progress Notes (Signed)
CSW has not received a call back from [facility] and has now left two messages for Veneta Penton in the [facility] Support Department at [facility].  CSW and patient had agreed that patient would call CSW when she came to her clinic appointment yesterday to complete a [employer] application.  CSW did not hear from her so CSW called her.  Patient states she is doing well and that her boyfriend has found a house that they are in the process of moving in to.  She states she forgot about her appointment yesterday because they have been busy with this.  She states she is still at [facility] until she has the baby and they completely move in to the house.  She states staff went shopping today so there should be food in the house.  Patient thanked CSW for checking on her and states no needs at this time.

## 2012-06-03 NOTE — Progress Notes (Signed)
CSW received call back from [employer] [facility] stating that there is sufficient food at [facility] and that she will address this concern with staff.

## 2012-06-06 ENCOUNTER — Ambulatory Visit (INDEPENDENT_AMBULATORY_CARE_PROVIDER_SITE_OTHER): Payer: Medicaid Other | Admitting: Obstetrics and Gynecology

## 2012-06-06 VITALS — BP 115/65 | Wt 225.0 lb

## 2012-06-06 MED ORDER — HYDROXYPROGESTERONE CAPROATE 250 MG/ML IM OIL: 250.0000 mg | TOPICAL_OIL | Freq: Once | INTRAMUSCULAR | Status: DC

## 2012-06-06 MED ORDER — HYDROXYPROGESTERONE CAPROATE 250 MG/ML IM OIL
250.0000 mg | TOPICAL_OIL | Freq: Once | INTRAMUSCULAR | Status: AC
  Administered 2012-06-06: 250 mg via INTRAMUSCULAR

## 2012-06-07 ENCOUNTER — Inpatient Hospital Stay (HOSPITAL_COMMUNITY)
Admission: AD | Admit: 2012-06-07 | Discharge: 2012-06-07 | Disposition: A | Discharge status: Home / Self Care | Payer: Medicaid Other | Source: Ambulatory Visit | Attending: Obstetrics & Gynecology | Admitting: Obstetrics & Gynecology

## 2012-06-07 ENCOUNTER — Encounter (HOSPITAL_COMMUNITY): Payer: Self-pay | Admitting: *Deleted

## 2012-06-07 DIAGNOSIS — O4690 Antepartum hemorrhage, unspecified, unspecified trimester: Secondary | ICD-10-CM | POA: Insufficient documentation

## 2012-06-07 LAB — URINALYSIS, ROUTINE W REFLEX MICROSCOPIC
Glucose, UA: NEGATIVE mg/dL
Leukocytes, UA: NEGATIVE
Nitrite: NEGATIVE
Protein, ur: NEGATIVE mg/dL
pH: 6 (ref 5.0–8.0)

## 2012-06-07 MED ORDER — ACETAMINOPHEN 325 MG PO TABS: ORAL_TABLET | ORAL | Status: DC

## 2012-06-07 MED ORDER — ACETAMINOPHEN 500 MG PO TABS
1000.0000 mg | ORAL_TABLET | Freq: Four times a day (QID) | ORAL | Status: DC | PRN
  Administered 2012-06-07: 1000 mg via ORAL
  Filled 2012-06-07: qty 2

## 2012-06-07 NOTE — MAU Note (Signed)
Pt reports lower back pain x 2 hours, abd pain today, had bloody mucus discharge this pm after intercourse. Some discomfort with urination.

## 2012-06-07 NOTE — MAU Provider Note (Signed)
Claudia Gonzales is a 22 y.o. female G2P0101 at 29.3wks presenting for eval of bloody mucous noted after intercourse this afternoon. Denies leak or ctx. Is having back discomfort and has been throughout preg. Receives 17P each wk in clinic. History OB History   Grav Para Term Preterm Abortions TAB SAB Ect Mult Living   2 1  1      1      Past Medical History  Diagnosis Date  . Asthma   . Preterm labor   . Anxiety   . Chlamydia    Past Surgical History  Procedure Laterality Date  . Appendectomy    . Wisdom tooth extraction     Family History: family history includes Cancer in her father, maternal grandfather, maternal grandmother, paternal grandfather, and paternal grandmother and Hypertension in her mother. Social History:  reports that she has quit smoking. Her smoking use included Cigarettes. She has a .5 pack-year smoking history. She has never used smokeless tobacco. She reports that she does not drink alcohol or use illicit drugs.   ROS    Blood pressure 111/66, pulse 88, temperature 97.2 F (36.2 C), temperature source Oral, resp. rate 16, height 5' 6.5" (1.689 m), weight 228 lb (103.42 kg), last menstrual period 11/14/2011, SpO2 100.00%. Maternal Exam:  Uterine Assessment: occ ctx; no pattern  Cervix: Cx C/L/post  Fetal Exam Fetal Monitor Review: Baseline rate: 130.  Variability: moderate (6-25 bpm).   Pattern: no decelerations and accelerations present.       Physical Exam  Constitutional: She is oriented to person, place, and time. She appears well-developed.  HENT:  Head: Normocephalic.  Cardiovascular: Normal rate.   Respiratory: Effort normal.  Genitourinary: Vagina normal.  No blood noted on perineum nor on glove after exam  Musculoskeletal: Normal range of motion.  Neurological: She is alert and oriented to person, place, and time.  Skin: Skin is warm and dry.  Psychiatric: She has a normal mood and affect. Her behavior is normal. Thought content  normal.    Prenatal labs: ABO, Rh: --/--/B POS (09/16 1809) Antibody: NEG (01/27 1154) Rubella: 11.00 (12/11 0925) RPR: NON REACTIVE (12/11 0925)  HBsAg: NEGATIVE (12/11 0925)  HIV: NON REACTIVE (12/11 0925)  GBS:     Assessment/Plan: IUP at 29.3wks Post coital bldg  D/C home with bldg precautions/pelvic rest x 1wk Encouraged not to have intercourse until 35wks due to hx of 29wk del in conjunction with post coital bldg Rx given for Tylenol use for periodic H/A and back pain (needs permission for all meds at [facility]) Note given for lifting restrictions 25lbs> F/U at next clinic appt (Mondays for 17P)   Cam Hai 06/07/2012, 10:03 PM

## 2012-06-13 ENCOUNTER — Encounter: Payer: Self-pay | Admitting: Advanced Practice Midwife

## 2012-06-13 ENCOUNTER — Encounter: Payer: Self-pay | Admitting: *Deleted

## 2012-06-13 ENCOUNTER — Ambulatory Visit (INDEPENDENT_AMBULATORY_CARE_PROVIDER_SITE_OTHER): Payer: Medicaid Other | Admitting: Advanced Practice Midwife

## 2012-06-13 ENCOUNTER — Other Ambulatory Visit: Payer: Self-pay | Admitting: Obstetrics and Gynecology

## 2012-06-13 VITALS — BP 106/66 | Temp 97.0°F | Wt 227.8 lb

## 2012-06-13 DIAGNOSIS — B373 Candidiasis of vulva and vagina: Secondary | ICD-10-CM | POA: Insufficient documentation

## 2012-06-13 LAB — POCT URINALYSIS DIP (DEVICE)
Glucose, UA: NEGATIVE mg/dL
Leukocytes, UA: NEGATIVE
Protein, ur: NEGATIVE mg/dL
Specific Gravity, Urine: 1.02 (ref 1.005–1.030)
Urobilinogen, UA: 0.2 mg/dL (ref 0.0–1.0)

## 2012-06-13 MED ORDER — PRENATAL VITAMINS PLUS 27-1 MG PO TABS: 1.0000 | ORAL_TABLET | Freq: Every day | ORAL | Status: DC

## 2012-06-13 MED ORDER — HYDROXYPROGESTERONE CAPROATE 250 MG/ML IM OIL
250.0000 mg | TOPICAL_OIL | Freq: Once | INTRAMUSCULAR | Status: AC
  Administered 2012-06-13: 250 mg via INTRAMUSCULAR

## 2012-06-13 MED ORDER — TERCONAZOLE 0.4 % VA CREA: 1.0000 | TOPICAL_CREAM | Freq: Every day | VAGINAL | Status: DC

## 2012-06-13 NOTE — Patient Instructions (Signed)
Monilial Vaginitis Vaginitis in a soreness, swelling and redness (inflammation) of the vagina and vulva. Monilial vaginitis is not a sexually transmitted infection. CAUSES  Yeast vaginitis is caused by yeast (candida) that is normally found in your vagina. With a yeast infection, the candida has overgrown in number to a point that upsets the chemical balance. SYMPTOMS   White, thick vaginal discharge.  Swelling, itching, redness and irritation of the vagina and possibly the lips of the vagina (vulva).  Burning or painful urination.  Painful intercourse. DIAGNOSIS  Things that may contribute to monilial vaginitis are:  Postmenopausal and virginal states.  Pregnancy.  Infections.  Being tired, sick or stressed, especially if you had monilial vaginitis in the past.  Diabetes. Good control will help lower the chance.  Birth control pills.  Tight fitting garments.  Using bubble bath, feminine sprays, douches or deodorant tampons.  Taking certain medications that kill germs (antibiotics).  Sporadic recurrence can occur if you become ill. TREATMENT  Your caregiver will give you medication.  There are several kinds of anti monilial vaginal creams and suppositories specific for monilial vaginitis. For recurrent yeast infections, use a suppository or cream in the vagina 2 times a week, or as directed.  Anti-monilial or steroid cream for the itching or irritation of the vulva may also be used. Get your caregiver's permission.  Painting the vagina with methylene blue solution may help if the monilial cream does not work.  Eating yogurt may help prevent monilial vaginitis. HOME CARE INSTRUCTIONS   Finish all medication as prescribed.  Do not have sex until treatment is completed or after your caregiver tells you it is okay.  Take warm sitz baths.  Do not douche.  Do not use tampons, especially scented ones.  Wear cotton underwear.  Avoid tight pants and panty  hose.  Tell your sexual partner that you have a yeast infection. They should go to their caregiver if they have symptoms such as mild rash or itching.  Your sexual partner should be treated as well if your infection is difficult to eliminate.  Practice safer sex. Use condoms.  Some vaginal medications cause latex condoms to fail. Vaginal medications that harm condoms are:  Cleocin cream.  Butoconazole (Femstat).  Terconazole (Terazol) vaginal suppository.  Miconazole (Monistat) (may be purchased over the counter). SEEK MEDICAL CARE IF:   You have a temperature by mouth above 102 F (38.9 C).  The infection is getting worse after 2 days of treatment.  The infection is not getting better after 3 days of treatment.  You develop blisters in or around your vagina.  You develop vaginal bleeding, and it is not your menstrual period.  You have pain when you urinate.  You develop intestinal problems.  You have pain with sexual intercourse. Document Released: 12/31/2004 Document Revised: 06/15/2011 Document Reviewed: 09/14/2008 ExitCare Patient Information 2013 [employer].  

## 2012-06-13 NOTE — Progress Notes (Signed)
Pulse 84 Patient reports lower back pain, also reports white d/c with itching and a "different odor but not bad"  Needs refill on PNV

## 2012-06-13 NOTE — Progress Notes (Signed)
C/O vaginal itching and white discharge. Minor erethema noted. Will treat presumptively with Terazol. Refilled PNV also. Living at [facility].  FOB with her today, seems supportive. No contractions today. Has them only rarely. Reviewed PTL precautions. Come to MAU if has more than 5 per hour.

## 2012-06-20 ENCOUNTER — Ambulatory Visit: Payer: Medicaid Other

## 2012-06-20 VITALS — BP 115/70 | HR 83 | Temp 97.4°F | Ht 66.0 in | Wt 226.7 lb

## 2012-06-20 MED ORDER — HYDROXYPROGESTERONE CAPROATE 250 MG/ML IM OIL
250.0000 mg | TOPICAL_OIL | Freq: Once | INTRAMUSCULAR | Status: AC
  Administered 2012-06-20: 250 mg via INTRAMUSCULAR

## 2012-06-27 ENCOUNTER — Encounter: Payer: Medicaid Other | Admitting: Advanced Practice Midwife

## 2012-06-27 ENCOUNTER — Telehealth: Payer: Self-pay | Admitting: General Practice

## 2012-06-27 NOTE — Telephone Encounter (Signed)
Patient called and left message stating she was 32 weeks and having bad pains in her stomach & would like a call back.

## 2012-06-27 NOTE — Telephone Encounter (Signed)
Patient did not keep appointment for today.  °

## 2012-06-27 NOTE — Telephone Encounter (Signed)
Patient does have appt today at 11am- will address at this appt.

## 2012-06-28 NOTE — Telephone Encounter (Signed)
Called pt and left message on her personal voice mail that we were hoping to see her at her appt yesterday since she was having pain and not feeling well. If she is still having problems or has questions, please leave a new message on the nurse voice mail. Otherwise, please call the appt line to schedule her next prenatal visit.

## 2012-06-29 ENCOUNTER — Ambulatory Visit (INDEPENDENT_AMBULATORY_CARE_PROVIDER_SITE_OTHER): Payer: Medicaid Other

## 2012-06-29 VITALS — BP 95/67 | Temp 98.7°F | Ht 66.0 in | Wt 223.3 lb

## 2012-06-29 DIAGNOSIS — O09219 Supervision of pregnancy with history of pre-term labor, unspecified trimester: Secondary | ICD-10-CM

## 2012-06-29 MED ORDER — HYDROXYPROGESTERONE CAPROATE 250 MG/ML IM OIL
250.0000 mg | TOPICAL_OIL | Freq: Once | INTRAMUSCULAR | Status: AC
  Administered 2012-06-29: 250 mg via INTRAMUSCULAR

## 2012-06-29 NOTE — Telephone Encounter (Signed)
Called pt and pt informed me that she is no longer having pains "that was two days ago" and that no one informed her of the appt she had scheduled.  I informed pt that she needs to get another appt scheduled and that I would transfer her call to the front for that to be scheduled.  Pt stated understanding and did not have any other questions.

## 2012-07-04 ENCOUNTER — Ambulatory Visit (INDEPENDENT_AMBULATORY_CARE_PROVIDER_SITE_OTHER): Payer: Medicaid Other | Admitting: Obstetrics and Gynecology

## 2012-07-04 ENCOUNTER — Encounter: Payer: Self-pay | Admitting: Obstetrics and Gynecology

## 2012-07-04 ENCOUNTER — Other Ambulatory Visit: Payer: Self-pay | Admitting: Obstetrics and Gynecology

## 2012-07-04 VITALS — BP 113/67 | Temp 97.1°F | Wt 229.2 lb

## 2012-07-04 DIAGNOSIS — O09219 Supervision of pregnancy with history of pre-term labor, unspecified trimester: Secondary | ICD-10-CM

## 2012-07-04 LAB — POCT URINALYSIS DIP (DEVICE)
Bilirubin Urine: NEGATIVE
Glucose, UA: NEGATIVE mg/dL
Hgb urine dipstick: NEGATIVE
Nitrite: NEGATIVE
Urobilinogen, UA: 0.2 mg/dL (ref 0.0–1.0)

## 2012-07-04 MED ORDER — HYDROXYPROGESTERONE CAPROATE 250 MG/ML IM OIL
250.0000 mg | TOPICAL_OIL | Freq: Once | INTRAMUSCULAR | Status: AC
  Administered 2012-07-04: 250 mg via INTRAMUSCULAR

## 2012-07-04 NOTE — Addendum Note (Signed)
Addended by: Gerome Apley on: 07/04/2012 11:35 AM   Modules accepted: Orders

## 2012-07-04 NOTE — Progress Notes (Signed)
No PTL sx. 17 P today. Interested in [facility].

## 2012-07-04 NOTE — Patient Instructions (Signed)
Pregnancy - Third Trimester  The third trimester of pregnancy (the last 3 months) is a period of the most rapid growth for you and your baby. The baby approaches a length of 20 inches and a weight of 6 to 10 pounds. The baby is adding on fat and getting ready for life outside your body. While inside, babies have periods of sleeping and waking, suck their thumbs, and hiccups. You can often feel small contractions of the uterus. This is false labor. It is also called Braxton-Hicks contractions. This is like a practice for labor. The usual problems in this stage of pregnancy include more difficulty breathing, swelling of the hands and feet from water retention, and having to urinate more often because of the uterus and baby pressing on your bladder.   PRENATAL EXAMS  · Blood work may continue to be done during prenatal exams. These tests are done to check on your health and the probable health of your baby. Blood work is used to follow your blood levels (hemoglobin). Anemia (low hemoglobin) is common during pregnancy. Iron and vitamins are given to help prevent this. You may also continue to be checked for diabetes. Some of the past blood tests may be done again.  · The size of the uterus is measured during each visit. This makes sure your baby is growing properly according to your pregnancy dates.  · Your blood pressure is checked every prenatal visit. This is to make sure you are not getting toxemia.  · Your urine is checked every prenatal visit for infection, diabetes and protein.  · Your weight is checked at each visit. This is done to make sure gains are happening at the suggested rate and that you and your baby are growing normally.  · Sometimes, an ultrasound is performed to confirm the position and the proper growth and development of the baby. This is a test done that bounces harmless sound waves off the baby so your caregiver can more accurately determine due dates.  · Discuss the type of pain medication and  anesthesia you will have during your labor and delivery.  · Discuss the possibility and anesthesia if a Cesarean Section might be necessary.  · Inform your caregiver if there is any mental or physical violence at home.  Sometimes, a specialized non-stress test, contraction stress test and biophysical profile are done to make sure the baby is not having a problem. Checking the amniotic fluid surrounding the baby is called an amniocentesis. The amniotic fluid is removed by sticking a needle into the belly (abdomen). This is sometimes done near the end of pregnancy if an early delivery is required. In this case, it is done to help make sure the baby's lungs are mature enough for the baby to live outside of the womb. If the lungs are not mature and it is unsafe to deliver the baby, an injection of cortisone medication is given to the mother 1 to 2 days before the delivery. This helps the baby's lungs mature and makes it safer to deliver the baby.  CHANGES OCCURING IN THE THIRD TRIMESTER OF PREGNANCY  Your body goes through many changes during pregnancy. They vary from person to person. Talk to your caregiver about changes you notice and are concerned about.  · During the last trimester, you have probably had an increase in your appetite. It is normal to have cravings for certain foods. This varies from person to person and pregnancy to pregnancy.  · You may begin to   get stretch marks on your hips, abdomen, and breasts. These are normal changes in the body during pregnancy. There are no exercises or medications to take which prevent this change.  · Constipation may be treated with a stool softener or adding bulk to your diet. Drinking lots of fluids, fiber in vegetables, fruits, and whole grains are helpful.  · Exercising is also helpful. If you have been very active up until your pregnancy, most of these activities can be continued during your pregnancy. If you have been less active, it is helpful to start an exercise  program such as walking. Consult your caregiver before starting exercise programs.  · Avoid all smoking, alcohol, un-prescribed drugs, herbs and "street drugs" during your pregnancy. These chemicals affect the formation and growth of the baby. Avoid chemicals throughout the pregnancy to ensure the delivery of a healthy infant.  · Backache, varicose veins and hemorrhoids may develop or get worse.  · You will tire more easily in the third trimester, which is normal.  · The baby's movements may be stronger and more often.  · You may become short of breath easily.  · Your belly button may stick out.  · A yellow discharge may leak from your breasts called colostrum.  · You may have a bloody mucus discharge. This usually occurs a few days to a week before labor begins.  HOME CARE INSTRUCTIONS   · Keep your caregiver's appointments. Follow your caregiver's instructions regarding medication use, exercise, and diet.  · During pregnancy, you are providing food for you and your baby. Continue to eat regular, well-balanced meals. Choose foods such as meat, fish, milk and other low fat dairy products, vegetables, fruits, and whole-grain breads and cereals. Your caregiver will tell you of the ideal weight gain.  · A physical sexual relationship may be continued throughout pregnancy if there are no other problems such as early (premature) leaking of amniotic fluid from the membranes, vaginal bleeding, or belly (abdominal) pain.  · Exercise regularly if there are no restrictions. Check with your caregiver if you are unsure of the safety of your exercises. Greater weight gain will occur in the last 2 trimesters of pregnancy. Exercising helps:  · Control your weight.  · Get you in shape for labor and delivery.  · You lose weight after you deliver.  · Rest a lot with legs elevated, or as needed for leg cramps or low back pain.  · Wear a good support or jogging bra for breast tenderness during pregnancy. This may help if worn during  sleep. Pads or tissues may be used in the bra if you are leaking colostrum.  · Do not use hot tubs, steam rooms, or saunas.  · Wear your seat belt when driving. This protects you and your baby if you are in an accident.  · Avoid raw meat, cat litter boxes and soil used by cats. These carry germs that can cause birth defects in the baby.  · It is easier to loose urine during pregnancy. Tightening up and strengthening the pelvic muscles will help with this problem. You can practice stopping your urination while you are going to the bathroom. These are the same muscles you need to strengthen. It is also the muscles you would use if you were trying to stop from passing gas. You can practice tightening these muscles up 10 times a set and repeating this about 3 times per day. Once you know what muscles to tighten up, do not perform these   exercises during urination. It is more likely to cause an infection by backing up the urine.  · Ask for help if you have financial, counseling or nutritional needs during pregnancy. Your caregiver will be able to offer counseling for these needs as well as refer you for other special needs.  · Make a list of emergency phone numbers and have them available.  · Plan on getting help from family or friends when you go home from the hospital.  · Make a trial run to the hospital.  · Take prenatal classes with the father to understand, practice and ask questions about the labor and delivery.  · Prepare the baby's room/nursery.  · Do not travel out of the city unless it is absolutely necessary and with the advice of your caregiver.  · Wear only low or no heal shoes to have better balance and prevent falling.  MEDICATIONS AND DRUG USE IN PREGNANCY  · Take prenatal vitamins as directed. The vitamin should contain 1 milligram of folic acid. Keep all vitamins out of reach of children. Only a couple vitamins or tablets containing iron may be fatal to a baby or young child when ingested.  · Avoid use  of all medications, including herbs, over-the-counter medications, not prescribed or suggested by your caregiver. Only take over-the-counter or prescription medicines for pain, discomfort, or fever as directed by your caregiver. Do not use aspirin, ibuprofen (Motrin®, Advil®, Nuprin®) or naproxen (Aleve®) unless OK'd by your caregiver.  · Let your caregiver also know about herbs you may be using.  · Alcohol is related to a number of birth defects. This includes fetal alcohol syndrome. All alcohol, in any form, should be avoided completely. Smoking will cause low birth rate and premature babies.  · Street/illegal drugs are very harmful to the baby. They are absolutely forbidden. A baby born to an addicted mother will be addicted at birth. The baby will go through the same withdrawal an adult does.  SEEK MEDICAL CARE IF:  You have any concerns or worries during your pregnancy. It is better to call with your questions if you feel they cannot wait, rather than worry about them.  DECISIONS ABOUT CIRCUMCISION  You may or may not know the sex of your baby. If you know your baby is a boy, it may be time to think about circumcision. Circumcision is the removal of the foreskin of the penis. This is the skin that covers the sensitive end of the penis. There is no proven medical need for this. Often this decision is made on what is popular at the time or based upon religious beliefs and social issues. You can discuss these issues with your caregiver or pediatrician.  SEEK IMMEDIATE MEDICAL CARE IF:   · An unexplained oral temperature above 102° F (38.9° C) develops, or as your caregiver suggests.  · You have leaking of fluid from the vagina (birth canal). If leaking membranes are suspected, take your temperature and tell your caregiver of this when you call.  · There is vaginal spotting, bleeding or passing clots. Tell your caregiver of the amount and how many pads are used.  · You develop a bad smelling vaginal discharge with  a change in the color from clear to white.  · You develop vomiting that lasts more than 24 hours.  · You develop chills or fever.  · You develop shortness of breath.  · You develop burning on urination.  · You loose more than 2 pounds of weight   or gain more than 2 pounds of weight or as suggested by your caregiver.  · You notice sudden swelling of your face, hands, and feet or legs.  · You develop belly (abdominal) pain. Round ligament discomfort is a common non-cancerous (benign) cause of abdominal pain in pregnancy. Your caregiver still must evaluate you.  · You develop a severe headache that does not go away.  · You develop visual problems, blurred or double vision.  · If you have not felt your baby move for more than 1 hour. If you think the baby is not moving as much as usual, eat something with sugar in it and lie down on your left side for an hour. The baby should move at least 4 to 5 times per hour. Call right away if your baby moves less than that.  · You fall, are in a car accident or any kind of trauma.  · There is mental or physical violence at home.  Document Released: 03/17/2001 Document Revised: 06/15/2011 Document Reviewed: 09/19/2008  [employer]® Patient Information ©2013 [employer].

## 2012-07-04 NOTE — Progress Notes (Signed)
Pulse-88   Edema-ankles 

## 2012-07-07 ENCOUNTER — Encounter: Payer: Self-pay | Admitting: *Deleted

## 2012-07-11 ENCOUNTER — Encounter: Payer: Medicaid Other | Admitting: Family Medicine

## 2012-07-11 ENCOUNTER — Ambulatory Visit (INDEPENDENT_AMBULATORY_CARE_PROVIDER_SITE_OTHER): Payer: Medicaid Other | Admitting: *Deleted

## 2012-07-11 VITALS — BP 107/72 | HR 90 | Temp 98.4°F | Wt 226.7 lb

## 2012-07-11 DIAGNOSIS — O09219 Supervision of pregnancy with history of pre-term labor, unspecified trimester: Secondary | ICD-10-CM

## 2012-07-11 DIAGNOSIS — O09213 Supervision of pregnancy with history of pre-term labor, third trimester: Secondary | ICD-10-CM

## 2012-07-11 MED ORDER — HYDROXYPROGESTERONE CAPROATE 250 MG/ML IM OIL
250.0000 mg | TOPICAL_OIL | Freq: Once | INTRAMUSCULAR | Status: AC
  Administered 2012-07-11: 250 mg via INTRAMUSCULAR

## 2012-07-18 ENCOUNTER — Encounter (HOSPITAL_COMMUNITY): Payer: Self-pay | Admitting: *Deleted

## 2012-07-18 ENCOUNTER — Inpatient Hospital Stay (HOSPITAL_COMMUNITY)
Admission: AD | Admit: 2012-07-18 | Discharge: 2012-07-18 | Disposition: A | Discharge status: Home / Self Care | Payer: Medicaid Other | Source: Ambulatory Visit | Attending: Obstetrics & Gynecology | Admitting: Obstetrics & Gynecology

## 2012-07-18 ENCOUNTER — Encounter: Payer: Medicaid Other | Admitting: Obstetrics & Gynecology

## 2012-07-18 DIAGNOSIS — M549 Dorsalgia, unspecified: Secondary | ICD-10-CM | POA: Insufficient documentation

## 2012-07-18 DIAGNOSIS — O2390 Unspecified genitourinary tract infection in pregnancy, unspecified trimester: Secondary | ICD-10-CM | POA: Insufficient documentation

## 2012-07-18 DIAGNOSIS — O0993 Supervision of high risk pregnancy, unspecified, third trimester: Secondary | ICD-10-CM

## 2012-07-18 DIAGNOSIS — R079 Chest pain, unspecified: Secondary | ICD-10-CM | POA: Insufficient documentation

## 2012-07-18 DIAGNOSIS — B373 Candidiasis of vulva and vagina: Secondary | ICD-10-CM

## 2012-07-18 DIAGNOSIS — B3731 Acute candidiasis of vulva and vagina: Secondary | ICD-10-CM | POA: Insufficient documentation

## 2012-07-18 DIAGNOSIS — R0781 Pleurodynia: Secondary | ICD-10-CM

## 2012-07-18 LAB — URINALYSIS, ROUTINE W REFLEX MICROSCOPIC
Hgb urine dipstick: NEGATIVE
Nitrite: NEGATIVE
Protein, ur: NEGATIVE mg/dL
Specific Gravity, Urine: 1.025 (ref 1.005–1.030)
Urobilinogen, UA: 0.2 mg/dL (ref 0.0–1.0)

## 2012-07-18 LAB — URINE MICROSCOPIC-ADD ON

## 2012-07-18 MED ORDER — CYCLOBENZAPRINE HCL 10 MG PO TABS: 10.0000 mg | ORAL_TABLET | Freq: Three times a day (TID) | ORAL | Status: DC | PRN

## 2012-07-18 MED ORDER — CYCLOBENZAPRINE HCL 10 MG PO TABS
10.0000 mg | ORAL_TABLET | Freq: Three times a day (TID) | ORAL | Status: DC | PRN
  Administered 2012-07-18: 10 mg via ORAL
  Filled 2012-07-18: qty 1

## 2012-07-18 NOTE — MAU Note (Signed)
Lightheaded & dizzy today. Rib pain where "baby is pushing up against her". Feels like is getting too big. Upper mid chest pain that feels like something is "being pulled".

## 2012-07-19 ENCOUNTER — Encounter: Payer: Self-pay | Admitting: Obstetrics & Gynecology

## 2012-07-21 ENCOUNTER — Encounter (HOSPITAL_COMMUNITY): Payer: Self-pay | Admitting: Emergency Medicine

## 2012-07-21 ENCOUNTER — Encounter: Payer: Self-pay | Admitting: Obstetrics & Gynecology

## 2012-07-21 ENCOUNTER — Observation Stay (HOSPITAL_COMMUNITY)
Admission: AD | Admit: 2012-07-21 | Discharge: 2012-07-22 | Disposition: A | Discharge status: Home / Self Care | Payer: Medicaid Other | Attending: Obstetrics & Gynecology | Admitting: Obstetrics & Gynecology

## 2012-07-21 ENCOUNTER — Observation Stay (HOSPITAL_COMMUNITY): Payer: Medicaid Other

## 2012-07-21 DIAGNOSIS — O99891 Other specified diseases and conditions complicating pregnancy: Principal | ICD-10-CM | POA: Insufficient documentation

## 2012-07-21 DIAGNOSIS — O4700 False labor before 37 completed weeks of gestation, unspecified trimester: Secondary | ICD-10-CM

## 2012-07-21 DIAGNOSIS — O9989 Other specified diseases and conditions complicating pregnancy, childbirth and the puerperium: Secondary | ICD-10-CM

## 2012-07-21 DIAGNOSIS — O0993 Supervision of high risk pregnancy, unspecified, third trimester: Secondary | ICD-10-CM

## 2012-07-21 DIAGNOSIS — F419 Anxiety disorder, unspecified: Secondary | ICD-10-CM | POA: Diagnosis present

## 2012-07-21 DIAGNOSIS — O9A219 Injury, poisoning and certain other consequences of external causes complicating pregnancy, unspecified trimester: Secondary | ICD-10-CM | POA: Diagnosis present

## 2012-07-21 DIAGNOSIS — R109 Unspecified abdominal pain: Secondary | ICD-10-CM

## 2012-07-21 DIAGNOSIS — O9A213 Injury, poisoning and certain other consequences of external causes complicating pregnancy, third trimester: Secondary | ICD-10-CM

## 2012-07-21 DIAGNOSIS — Y92009 Unspecified place in unspecified non-institutional (private) residence as the place of occurrence of the external cause: Secondary | ICD-10-CM | POA: Insufficient documentation

## 2012-07-21 DIAGNOSIS — Z3490 Encounter for supervision of normal pregnancy, unspecified, unspecified trimester: Secondary | ICD-10-CM

## 2012-07-21 LAB — DIC (DISSEMINATED INTRAVASCULAR COAGULATION)PANEL
D-Dimer, Quant: 1.52 ug/mL-FEU — ABNORMAL HIGH (ref 0.00–0.48)
INR: 1.21 (ref 0.00–1.49)
Platelets: 152 10*3/uL (ref 150–400)
aPTT: 33 seconds (ref 24–37)

## 2012-07-21 LAB — CBC
HCT: 29.8 % — ABNORMAL LOW (ref 36.0–46.0)
MCH: 28.5 pg (ref 26.0–34.0)
MCV: 84.2 fL (ref 78.0–100.0)
RDW: 14.7 % (ref 11.5–15.5)
WBC: 8.9 10*3/uL (ref 4.0–10.5)

## 2012-07-21 LAB — RAPID URINE DRUG SCREEN, HOSP PERFORMED
Barbiturates: NOT DETECTED
Benzodiazepines: NOT DETECTED
Cocaine: NOT DETECTED
Tetrahydrocannabinol: NOT DETECTED

## 2012-07-21 LAB — URINALYSIS, ROUTINE W REFLEX MICROSCOPIC
Bilirubin Urine: NEGATIVE
Hgb urine dipstick: NEGATIVE
Specific Gravity, Urine: >1.03 — ABNORMAL HIGH (ref 1.005–1.030)
Urobilinogen, UA: 0.2 mg/dL (ref 0.0–1.0)

## 2012-07-21 MED ORDER — ZOLPIDEM TARTRATE 5 MG PO TABS: 5.0000 mg | ORAL_TABLET | Freq: Every evening | ORAL | Status: DC | PRN

## 2012-07-21 MED ORDER — CALCIUM CARBONATE ANTACID 500 MG PO CHEW: 2.0000 | CHEWABLE_TABLET | ORAL | Status: DC | PRN

## 2012-07-21 MED ORDER — OXYCODONE-ACETAMINOPHEN 5-325 MG PO TABS
1.0000 | ORAL_TABLET | Freq: Four times a day (QID) | ORAL | Status: DC | PRN
  Administered 2012-07-21: 1 via ORAL
  Administered 2012-07-22: 2 via ORAL
  Administered 2012-07-22: 1 via ORAL
  Filled 2012-07-21 (×2): qty 1
  Filled 2012-07-21: qty 2

## 2012-07-21 MED ORDER — LORAZEPAM 1 MG PO TABS
1.0000 mg | ORAL_TABLET | Freq: Four times a day (QID) | ORAL | Status: DC | PRN
  Administered 2012-07-21: 1 mg via ORAL
  Filled 2012-07-21: qty 1

## 2012-07-21 MED ORDER — PRENATAL MULTIVITAMIN CH
1.0000 | ORAL_TABLET | Freq: Every day | ORAL | Status: DC
  Administered 2012-07-22: 1 via ORAL
  Filled 2012-07-21: qty 1

## 2012-07-21 MED ORDER — ACETAMINOPHEN 325 MG PO TABS
650.0000 mg | ORAL_TABLET | Freq: Once | ORAL | Status: AC
  Administered 2012-07-21: 650 mg via ORAL
  Filled 2012-07-21: qty 2

## 2012-07-21 MED ORDER — DOCUSATE SODIUM 100 MG PO CAPS
100.0000 mg | ORAL_CAPSULE | Freq: Every day | ORAL | Status: DC
  Administered 2012-07-22: 100 mg via ORAL
  Filled 2012-07-21: qty 1

## 2012-07-21 MED ORDER — CYCLOBENZAPRINE HCL 10 MG PO TABS
10.0000 mg | ORAL_TABLET | Freq: Three times a day (TID) | ORAL | Status: DC | PRN
  Administered 2012-07-22: 10 mg via ORAL
  Filled 2012-07-21: qty 1

## 2012-07-21 NOTE — ED Notes (Signed)
Pt is G-2 P-1  Report given to [facility]

## 2012-07-21 NOTE — ED Notes (Signed)
Strong fetal heart rate 140 with fetal monitor.

## 2012-07-21 NOTE — ED Notes (Signed)
Pt c/o pain in left upper ribs.  Rapid response OB nurse called.

## 2012-07-21 NOTE — ED Notes (Signed)
Report called to [facility] given to [place], [place]
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
## 2012-07-22 ENCOUNTER — Encounter (HOSPITAL_COMMUNITY): Payer: Self-pay | Admitting: *Deleted

## 2012-07-22 DIAGNOSIS — O4700 False labor before 37 completed weeks of gestation, unspecified trimester: Secondary | ICD-10-CM

## 2012-07-22 DIAGNOSIS — R109 Unspecified abdominal pain: Secondary | ICD-10-CM

## 2012-07-22 MED ORDER — DIPHENHYDRAMINE HCL 25 MG PO CAPS
25.0000 mg | ORAL_CAPSULE | ORAL | Status: DC | PRN
  Administered 2012-07-22: 25 mg via ORAL
  Filled 2012-07-22: qty 1

## 2012-07-22 NOTE — ED Provider Notes (Signed)
I have personally seen and examined the patient.  I have discussed the plan of care with the resident.  I have reviewed the documentation on PMH/FH/Soc. History.  I have reviewed the documentation of the resident and agree.   Joya Gaskins, MD 07/22/12 [phone]
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
## 2012-07-22 NOTE — Progress Notes (Signed)
UR completed 
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
## 2012-07-23 LAB — URINE CULTURE

## 2012-07-25 ENCOUNTER — Encounter: Payer: Self-pay | Admitting: Emergency Medicine

## 2012-07-25 ENCOUNTER — Encounter: Payer: Self-pay | Admitting: Obstetrics and Gynecology

## 2012-07-25 ENCOUNTER — Other Ambulatory Visit: Payer: Self-pay | Admitting: Obstetrics & Gynecology

## 2012-07-25 ENCOUNTER — Ambulatory Visit (INDEPENDENT_AMBULATORY_CARE_PROVIDER_SITE_OTHER): Payer: Medicaid Other | Admitting: Obstetrics & Gynecology

## 2012-07-25 DIAGNOSIS — O09219 Supervision of pregnancy with history of pre-term labor, unspecified trimester: Secondary | ICD-10-CM

## 2012-07-25 DIAGNOSIS — O9A213 Injury, poisoning and certain other consequences of external causes complicating pregnancy, third trimester: Secondary | ICD-10-CM

## 2012-07-25 LAB — POCT URINALYSIS DIP (DEVICE)
Ketones, ur: NEGATIVE mg/dL
Protein, ur: 30 mg/dL — AB
Specific Gravity, Urine: 1.025 (ref 1.005–1.030)
pH: 7.5 (ref 5.0–8.0)

## 2012-07-25 LAB — OB RESULTS CONSOLE GC/CHLAMYDIA
Chlamydia: NEGATIVE
Gonorrhea: NEGATIVE

## 2012-07-25 LAB — OB RESULTS CONSOLE GBS: GBS: NEGATIVE

## 2012-07-25 NOTE — Patient Instructions (Signed)
Return to clinic for any obstetric concerns or go to MAU for evaluation  

## 2012-07-25 NOTE — Progress Notes (Signed)
Pulse 92 C/o pain on LUQ of abdomen and middle part of back. Vaginal d/c as thin clear; no itch, no odor.

## 2012-07-25 NOTE — Progress Notes (Signed)
Pelvic cultures done.  No other complaints or concerns.  Fetal movement and labor precautions reviewed.  Declines any further 17P injections, last injection was two weeks ago.

## 2012-07-26 LAB — GC/CHLAMYDIA PROBE AMP: CT Probe RNA: NEGATIVE

## 2012-07-28 LAB — CULTURE, BETA STREP (GROUP B ONLY)

## 2012-07-29 ENCOUNTER — Encounter: Payer: Self-pay | Admitting: Obstetrics & Gynecology

## 2012-08-01 ENCOUNTER — Ambulatory Visit (INDEPENDENT_AMBULATORY_CARE_PROVIDER_SITE_OTHER): Payer: Medicaid Other | Admitting: Family

## 2012-08-01 VITALS — BP 115/75 | Temp 97.3°F | Wt 231.1 lb

## 2012-08-01 DIAGNOSIS — O0993 Supervision of high risk pregnancy, unspecified, third trimester: Secondary | ICD-10-CM

## 2012-08-01 DIAGNOSIS — O09219 Supervision of pregnancy with history of pre-term labor, unspecified trimester: Secondary | ICD-10-CM

## 2012-08-01 LAB — POCT URINALYSIS DIP (DEVICE)
Bilirubin Urine: NEGATIVE
Glucose, UA: NEGATIVE mg/dL
Hgb urine dipstick: NEGATIVE
Nitrite: NEGATIVE

## 2012-08-01 NOTE — Progress Notes (Signed)
No questions or concerns; reviewed labor precautions

## 2012-08-03 ENCOUNTER — Inpatient Hospital Stay (HOSPITAL_COMMUNITY)
Admission: AD | Admit: 2012-08-03 | Discharge: 2012-08-03 | Disposition: A | Discharge status: Home / Self Care | Payer: Medicaid Other | Source: Ambulatory Visit | Attending: Obstetrics & Gynecology | Admitting: Obstetrics & Gynecology

## 2012-08-03 ENCOUNTER — Encounter (HOSPITAL_COMMUNITY): Payer: Self-pay | Admitting: *Deleted

## 2012-08-03 DIAGNOSIS — O228X9 Other venous complications in pregnancy, unspecified trimester: Secondary | ICD-10-CM | POA: Insufficient documentation

## 2012-08-03 DIAGNOSIS — M549 Dorsalgia, unspecified: Secondary | ICD-10-CM

## 2012-08-03 DIAGNOSIS — R109 Unspecified abdominal pain: Secondary | ICD-10-CM | POA: Insufficient documentation

## 2012-08-03 DIAGNOSIS — K644 Residual hemorrhoidal skin tags: Secondary | ICD-10-CM

## 2012-08-03 DIAGNOSIS — O26899 Other specified pregnancy related conditions, unspecified trimester: Secondary | ICD-10-CM

## 2012-08-03 DIAGNOSIS — K649 Unspecified hemorrhoids: Secondary | ICD-10-CM | POA: Insufficient documentation

## 2012-08-03 DIAGNOSIS — M545 Low back pain, unspecified: Secondary | ICD-10-CM | POA: Insufficient documentation

## 2012-08-03 MED ORDER — HYDROCORTISONE ACE-PRAMOXINE 1-1 % RE FOAM: 1.0000 | Freq: Two times a day (BID) | RECTAL | Status: DC | PRN

## 2012-08-03 NOTE — MAU Note (Signed)
Patient states she is having mild "braxton hicks" contractions, good fetal movement and no bleeding or leaking. Patient states she was given muscle relaxer a couple of weeks ago and they are not helping for upper abdominal pain.

## 2012-08-03 NOTE — MAU Note (Addendum)
Patient states she is still having pain in her lower back. C/O pain still in upper abdomen, burning around lower sternal area. States it hurts when clothes rub against it, but cannot tell if it is on the outside on the skin or on the inside. States she wants to be checked for hemorrhoids. States she has pain where she has a scar on her abdomen. States shelter is not giving her her medication like the doctor ordered it. States they try to make her take it at their convenience, not when she needs it. Has multiple C/O and issues.

## 2012-08-03 NOTE — MAU Provider Note (Signed)
History     CSN: [id number]  Arrival date and time: 08/03/12 4696   First Provider Initiated Contact with Patient 08/03/12 1111      Chief Complaint  Patient presents with  . Abdominal Pain   HPI This is a 22 y.o. female at [redacted]w[redacted]d who presents with multiple complaints. States her back still hurts and she has epigastric burning.  Also has some pain in her rectal area when bending over or walking. Uses flexeril with relief for back pain but states the shelter gives it on a schedule and not when she wants it. States they do not adhere to the PRN order.  If she misses the time they say, they won't give it to her until several hours later. Also wants a note to be on bedrest so that she wont have to do the chores in the house or be made "to walk far down the street picking up trash for punishment".  2  RN Note: Patient states she is still having pain in her lower back. C/O pain still in upper abdomen, burning around lower sternal area. States it hurts when clothes rub against it, but cannot tell if it is on the outside on the skin or on the inside. States she wants to be checked for hemorrhoids. States she has pain where she has a scar on her abdomen. States shelter is not giving her her medication like the doctor ordered it. States they try to make her take it at their convenience, not when she needs it. Has multiple C/O and issues.  OB History   Grav Para Term Preterm Abortions TAB SAB Ect Mult Living   2 1  1      1       Past Medical History  Diagnosis Date  . Asthma   . Preterm labor   . Anxiety   . Chlamydia     Past Surgical History  Procedure Laterality Date  . Appendectomy    . Wisdom tooth extraction      Family History  Problem Relation Age of Onset  . Hypertension Mother   . Cancer Father   . Cancer Maternal Grandmother   . Cancer Maternal Grandfather   . Cancer Paternal Grandmother   . Cancer Paternal Grandfather     History  Substance Use Topics  . Smoking  status: Former Smoker -- 0.50 packs/day for 1 years    Types: Cigarettes    Quit date: 06/17/2012  . Smokeless tobacco: Never Used  . Alcohol Use: No    Allergies: No Known Allergies  Prescriptions prior to admission  Medication Sig Dispense Refill  . acetaminophen (TYLENOL) 325 MG tablet Take 325 mg by mouth every 6 (six) hours as needed for pain.      . cyclobenzaprine (FLEXERIL) 10 MG tablet Take 10 mg by mouth 3 (three) times daily as needed for muscle spasms.      . Prenatal Vit-Fe Fumarate-FA (PRENATAL MULTIVITAMIN) TABS Take 1 tablet by mouth daily at 12 noon.        Review of Systems  Constitutional: Positive for malaise/fatigue. Negative for fever and chills.  Gastrointestinal: Positive for abdominal pain. Negative for nausea, vomiting, diarrhea and constipation.       Pain around rectal area at times Epigastric superficial skin pain  Genitourinary: Negative for dysuria.  Musculoskeletal: Positive for back pain.  Neurological: Negative for weakness and headaches.   Physical Exam   Blood pressure 117/63, pulse 91, temperature 98.4 F (36.9 C), temperature source  Oral, resp. rate 16, height 5\' 7"  (1.702 m), weight 234 lb 3.2 oz (106.232 kg), last menstrual period 11/14/2011.  Physical Exam  Constitutional: She is oriented to person, place, and time. She appears well-developed and well-nourished. No distress.  HENT:  Head: Normocephalic.  Cardiovascular: Normal rate, regular rhythm and normal heart sounds.   Respiratory: Effort normal and breath sounds normal. No respiratory distress. She has no wheezes. She has no rales. She exhibits no tenderness.  Small external skin tag on anus   GI: Soft. She exhibits no distension and no mass. There is no tenderness. There is no rebound and no guarding.  Rectal exam reveals one small internal hemorrhoid  Genitourinary: Vagina normal and uterus normal. No vaginal discharge found.  One small hemorrhoid on rectal exam   Musculoskeletal: Normal range of motion.  Neurological: She is alert and oriented to person, place, and time.  Skin: Skin is warm and dry.  Psychiatric: She has a normal mood and affect.  FHR reactive Occasional contractions  MAU Course  Procedures  Assessment and Plan  A:  SIUP at [redacted]w[redacted]d       One hemorrhoid      Chronic back pain      Multiple somatic complaints related to late pregnancy discomforts      Multiple complaints about restrictions at [facility]  P:  Discussed multiple issues and assisted with problem solving      Discussed that bedrest is not indicated and will make back pain worse      Proctofoam for hemorrhoid      Follow up in clinic as scheduled      Letter given for activity limits regarding strenuous level activity and allowing for rest as needed       Medication List    TAKE these medications       acetaminophen 325 MG tablet  Commonly known as:  TYLENOL  Take 325 mg by mouth every 6 (six) hours as needed for pain.     cyclobenzaprine 10 MG tablet  Commonly known as:  FLEXERIL  Take 10 mg by mouth 3 (three) times daily as needed for muscle spasms.     hydrocortisone-pramoxine rectal foam  Commonly known as:  PROCTOFOAM HC  Place 1 applicator rectally 2 (two) times daily as needed for hemorrhoids.     prenatal multivitamin Tabs  Take 1 tablet by mouth daily at 12 noon.         [facility] 08/03/2012, 11:54 AM

## 2012-08-08 ENCOUNTER — Inpatient Hospital Stay (HOSPITAL_COMMUNITY)
Admission: AD | Admit: 2012-08-08 | Discharge: 2012-08-09 | Disposition: A | Discharge status: Rehabilitation Facility | Payer: Medicaid Other | Source: Ambulatory Visit | Attending: Family Medicine | Admitting: Family Medicine

## 2012-08-08 ENCOUNTER — Ambulatory Visit (INDEPENDENT_AMBULATORY_CARE_PROVIDER_SITE_OTHER): Payer: Medicaid Other | Admitting: Obstetrics & Gynecology

## 2012-08-08 VITALS — BP 116/73 | Wt 230.4 lb

## 2012-08-08 DIAGNOSIS — O0993 Supervision of high risk pregnancy, unspecified, third trimester: Secondary | ICD-10-CM

## 2012-08-08 DIAGNOSIS — O09219 Supervision of pregnancy with history of pre-term labor, unspecified trimester: Secondary | ICD-10-CM

## 2012-08-08 DIAGNOSIS — O479 False labor, unspecified: Secondary | ICD-10-CM

## 2012-08-08 LAB — POCT URINALYSIS DIP (DEVICE)
Bilirubin Urine: NEGATIVE
Glucose, UA: NEGATIVE mg/dL
Ketones, ur: NEGATIVE mg/dL
pH: 7 (ref 5.0–8.0)

## 2012-08-08 MED ORDER — OXYCODONE-ACETAMINOPHEN 5-325 MG PO TABS
2.0000 | ORAL_TABLET | Freq: Once | ORAL | Status: AC
  Administered 2012-08-09: 2 via ORAL
  Filled 2012-08-08: qty 2

## 2012-08-08 NOTE — MAU Provider Note (Signed)
Attestation of Attending Supervision of Advanced Practitioner (CNM/NP): Evaluation and management procedures were performed by the Advanced Practitioner under my supervision and collaboration. I have reviewed the Advanced Practitioner's note and chart, and I agree with the management and plan.  Yaden Seith H. 9:31 AM   

## 2012-08-08 NOTE — MAU Note (Signed)
Pt G2 P1 at 38.2wks having contractions since 1600.  Denies leaking or bleeding.

## 2012-08-08 NOTE — Patient Instructions (Signed)
Normal Labor and Delivery  Your caregiver must first be sure you are in labor. Signs of labor include:  · You may pass what is called "the mucus plug" before labor begins. This is a small amount of blood stained mucus.  · Regular uterine contractions.  · The time between contractions get closer together.  · The discomfort and pain gradually gets more intense.  · Pains are mostly located in the back.  · Pains get worse when walking.  · The cervix (the opening of the uterus becomes thinner (begins to efface) and opens up (dilates).  Once you are in labor and admitted into the hospital or care center, your caregiver will do the following:  · A complete physical examination.  · Check your vital signs (blood pressure, pulse, temperature and the fetal heart rate).  · Do a vaginal examination (using a sterile glove and lubricant) to determine:  · The position (presentation) of the baby (head [vertex] or buttock first).  · The level (station) of the baby's head in the birth canal.  · The effacement and dilatation of the cervix.  · You may have your pubic hair shaved and be given an enema depending on your caregiver and the circumstance.  · An electronic monitor is usually placed on your abdomen. The monitor follows the length and intensity of the contractions, as well as the baby's heart rate.  · Usually, your caregiver will insert an IV in your arm with a bottle of sugar water. This is done as a precaution so that medications can be given to you quickly during labor or delivery.  NORMAL LABOR AND DELIVERY IS DIVIDED UP INTO 3 STAGES:  First Stage  This is when regular contractions begin and the cervix begins to efface and dilate. This stage can last from 3 to 15 hours. The end of the first stage is when the cervix is 100% effaced and 10 centimeters dilated. Pain medications may be given by   · Injection (morphine, demerol, etc.)  · Regional anesthesia (spinal, caudal or epidural, anesthetics given in different locations of  the spine). Paracervical pain medication may be given, which is an injection of and anesthetic on each side of the cervix.  A pregnant woman may request to have "Natural Childbirth" which is not to have any medications or anesthesia during her labor and delivery.  Second Stage  This is when the baby comes down through the birth canal (vagina) and is born. This can take 1 to 4 hours. As the baby's head comes down through the birth canal, you may feel like you are going to have a bowel movement. You will get the urge to bear down and push until the baby is delivered. As the baby's head is being delivered, the caregiver will decide if an episiotomy (a cut in the perineum and vagina area) is needed to prevent tearing of the tissue in this area. The episiotomy is sewn up after the delivery of the baby and placenta. Sometimes a mask with nitrous oxide is given for the mother to breath during the delivery of the baby to help if there is too much pain. The end of Stage 2 is when the baby is fully delivered. Then when the umbilical cord stops pulsating it is clamped and cut.  Third Stage  The third stage begins after the baby is completely delivered and ends after the placenta (afterbirth) is delivered. This usually takes 5 to 30 minutes. After the placenta is delivered, a medication   is given either by intravenous or injection to help contract the uterus and prevent bleeding. The third stage is not painful and pain medication is usually not necessary. If an episiotomy was done, it is repaired at this time.  After the delivery, the mother is watched and monitored closely for 1 to 2 hours to make sure there is no postpartum bleeding (hemorrhage). If there is a lot of bleeding, medication is given to contract the uterus and stop the bleeding.  Document Released: 12/31/2007 Document Revised: 06/15/2011 Document Reviewed: 12/31/2007  [employer]® Patient Information ©2013 [employer].

## 2012-08-08 NOTE — Progress Notes (Signed)
Low abdominal muscle pain, drowsy with Flexiril

## 2012-08-08 NOTE — Progress Notes (Signed)
P:  Complains of nausea

## 2012-08-09 DIAGNOSIS — O479 False labor, unspecified: Secondary | ICD-10-CM

## 2012-08-09 NOTE — MAU Provider Note (Signed)
History     CSN: [id number]  Arrival date and time: 08/08/12 2036   None     Chief Complaint  Patient presents with  . Contractions   HPI 22 y.o. G2P0101 at [redacted]w[redacted]d with contractions since 4 PM. Seemed to space out more once she arrived at hospital.  She denies bleeding or loss of fluid.  Baby is moving well.  Receives care at [facility] for history of preterm delivery at 30 weeks. Last visit was dilated to 3 cm.   OB History   Grav Para Term Preterm Abortions TAB SAB Ect Mult Living   2 1  1      1       Past Medical History  Diagnosis Date  . Asthma   . Preterm labor   . Anxiety   . Chlamydia     Past Surgical History  Procedure Laterality Date  . Appendectomy    . Wisdom tooth extraction      Family History  Problem Relation Age of Onset  . Hypertension Mother   . Cancer Father   . Cancer Maternal Grandmother   . Cancer Maternal Grandfather   . Cancer Paternal Grandmother   . Cancer Paternal Grandfather     History  Substance Use Topics  . Smoking status: Former Smoker -- 0.50 packs/day for 1 years    Types: Cigarettes    Quit date: 06/17/2012  . Smokeless tobacco: Never Used  . Alcohol Use: No    Allergies: No Known Allergies  Prescriptions prior to admission  Medication Sig Dispense Refill  . acetaminophen (TYLENOL) 325 MG tablet Take 325 mg by mouth every 6 (six) hours as needed for pain.      . cyclobenzaprine (FLEXERIL) 10 MG tablet Take 10 mg by mouth 3 (three) times daily as needed for muscle spasms.      . hydrocortisone-pramoxine (PROCTOFOAM HC) rectal foam Place 1 applicator rectally 2 (two) times daily as needed for hemorrhoids.  10 g  0  . Prenatal Vit-Fe Fumarate-FA (PRENATAL MULTIVITAMIN) TABS Take 1 tablet by mouth daily at 12 noon.        Review of Systems  Constitutional: Negative for fever and chills.  Eyes: Negative for blurred vision and double vision.  Gastrointestinal: Positive for abdominal pain. Negative for nausea and vomiting.   Genitourinary: Negative for dysuria.  Neurological: Negative for headaches.   Physical Exam   Blood pressure 122/71, pulse 92, temperature 98.7 F (37.1 C), temperature source Oral, resp. rate 18, height 5\' 6"  (1.676 m), weight 105.779 kg (233 lb 3.2 oz), last menstrual period 11/14/2011.  Physical Exam  Constitutional: She is oriented to person, place, and time. She appears well-developed and well-nourished. No distress.  HENT:  Head: Normocephalic and atraumatic.  Eyes: Conjunctivae and EOM are normal.  Neck: Normal range of motion. Neck supple.  Cardiovascular: Normal rate, regular rhythm and normal heart sounds.   Respiratory: Effort normal and breath sounds normal. No respiratory distress.  GI: Soft. There is no tenderness. There is no rebound and no guarding.  Genitourinary:  Normal external genitalia, normal vagina. Minimal discharge (white). No leaking fluid or bleeding. Cervix 3.5/50/-2 very posterior, soft. No change after 1.5 hours walking.  Musculoskeletal: Normal range of motion. She exhibits no edema and no tenderness.  Neurological: She is alert and oriented to person, place, and time.  Skin: Skin is warm and dry.  Psychiatric: She has a normal mood and affect.   FHTs:  140, mod var, accels present, no  decel TOCO:  q 5-10 minutes  MAU Course  Procedures   Assessment and Plan  21 y.o. G2P0101 at [redacted]w[redacted]d with contractions - No cervical change after 2 hours - Percocet given in MAU for pain. No rx given. - Home with labor precautions. Lives at Room at the [facility]. - Stable for discharge.  Napoleon Form 08/09/2012, 12:01 AM

## 2012-08-09 NOTE — MAU Provider Note (Signed)
Chart reviewed and agree with management and plan.  

## 2012-08-12 ENCOUNTER — Inpatient Hospital Stay (HOSPITAL_COMMUNITY)
Admission: AD | Admit: 2012-08-12 | Discharge: 2012-08-12 | Disposition: A | Discharge status: Home / Self Care | Payer: Medicaid Other | Source: Ambulatory Visit | Attending: Obstetrics & Gynecology | Admitting: Obstetrics & Gynecology

## 2012-08-12 ENCOUNTER — Encounter (HOSPITAL_COMMUNITY): Payer: Self-pay

## 2012-08-12 DIAGNOSIS — O479 False labor, unspecified: Secondary | ICD-10-CM | POA: Insufficient documentation

## 2012-08-12 DIAGNOSIS — R109 Unspecified abdominal pain: Secondary | ICD-10-CM | POA: Insufficient documentation

## 2012-08-12 NOTE — MAU Note (Signed)
I've been having contractions off and on all day. Lower back pain.

## 2012-08-12 NOTE — MAU Provider Note (Signed)
  History     CSN: [id number]  Arrival date and time: 08/12/12 2159   None     Chief Complaint  Patient presents with  . Contractions   HPI Ms Protzman is a 21yo G2P0101 at 38.6wks who presents for eval of cramping. Denies leaking or bldg. No N/V/D.  Was here for labor eval x 3d ago and cx remained unchanged.  Doesn't feel that ctx are much stronger than previously, but is ready for the pregnancy to be over.  OB History   Grav Para Term Preterm Abortions TAB SAB Ect Mult Living   2 1  1      1       Past Medical History  Diagnosis Date  . Asthma   . Preterm labor   . Anxiety   . Chlamydia     Past Surgical History  Procedure Laterality Date  . Appendectomy    . Wisdom tooth extraction      Family History  Problem Relation Age of Onset  . Hypertension Mother   . Cancer Father   . Cancer Maternal Grandmother   . Cancer Maternal Grandfather   . Cancer Paternal Grandmother   . Cancer Paternal Grandfather     History  Substance Use Topics  . Smoking status: Former Smoker -- 0.50 packs/day for 1 years    Types: Cigarettes    Quit date: 06/17/2012  . Smokeless tobacco: Never Used  . Alcohol Use: No    Allergies: No Known Allergies  Prescriptions prior to admission  Medication Sig Dispense Refill  . acetaminophen (TYLENOL) 325 MG tablet Take 325 mg by mouth every 6 (six) hours as needed for pain.      . cyclobenzaprine (FLEXERIL) 10 MG tablet Take 10 mg by mouth 3 (three) times daily as needed for muscle spasms.      . hydrocortisone-pramoxine (PROCTOFOAM HC) rectal foam Place 1 applicator rectally 2 (two) times daily as needed for hemorrhoids.  10 g  0  . Prenatal Vit-Fe Fumarate-FA (PRENATAL MULTIVITAMIN) TABS Take 1 tablet by mouth daily at 12 noon.        ROS Physical Exam   Blood pressure 123/70, pulse 90, temperature 98.3 F (36.8 C), temperature source Oral, resp. rate 18, height 5\' 7"  (1.702 m), weight 236 lb (107.049 kg), last menstrual period  11/14/2011, SpO2 100.00%.  Physical Exam  Constitutional: She is oriented to person, place, and time. She appears well-developed.  HENT:  Head: Normocephalic.  Neck: Normal range of motion.  Cardiovascular: Normal rate.   Respiratory: Effort normal.  GI:  FHR 140, + accels, no decels No ctx per monitor  Genitourinary: Vagina normal.  Cx 1.5/thick/vtx -3   Musculoskeletal: Normal range of motion.  Neurological: She is alert and oriented to person, place, and time.  Skin: Skin is warm and dry.  Psychiatric: She has a normal mood and affect. Her behavior is normal. Thought content normal.    MAU Course  Procedures    Assessment and Plan  IUP at 38.6wks Braxton Hicks ctx  D/C home with labor precautions F/U at scheduled visit with [facility] on Monday or sooner prn  Cheyeanne Roadcap 08/12/2012, 10:47 PM

## 2012-08-15 ENCOUNTER — Ambulatory Visit (INDEPENDENT_AMBULATORY_CARE_PROVIDER_SITE_OTHER): Payer: Medicaid Other | Admitting: Family

## 2012-08-15 VITALS — BP 129/77 | Temp 97.3°F | Wt 231.9 lb

## 2012-08-15 DIAGNOSIS — R82998 Other abnormal findings in urine: Secondary | ICD-10-CM

## 2012-08-15 DIAGNOSIS — R8271 Bacteriuria: Secondary | ICD-10-CM

## 2012-08-15 DIAGNOSIS — O09219 Supervision of pregnancy with history of pre-term labor, unspecified trimester: Secondary | ICD-10-CM

## 2012-08-15 LAB — POCT URINALYSIS DIP (DEVICE)
Glucose, UA: NEGATIVE mg/dL
Nitrite: NEGATIVE
Protein, ur: NEGATIVE mg/dL
Specific Gravity, Urine: 1.02 (ref 1.005–1.030)
Urobilinogen, UA: 0.2 mg/dL (ref 0.0–1.0)

## 2012-08-15 LAB — OB RESULTS CONSOLE GBS: GBS: POSITIVE

## 2012-08-15 NOTE — Progress Notes (Signed)
Patient has lower back pain  

## 2012-08-15 NOTE — Progress Notes (Signed)
No questions or concerns; reviewed labor precautions and plan for IOL if baby not born by 41 wks.  No UTI symptoms, urine culture sent.

## 2012-08-16 ENCOUNTER — Encounter (HOSPITAL_COMMUNITY): Payer: Self-pay

## 2012-08-16 ENCOUNTER — Inpatient Hospital Stay (HOSPITAL_COMMUNITY)
Admission: AD | Admit: 2012-08-16 | Discharge: 2012-08-16 | Disposition: A | Discharge status: Home / Self Care | Payer: Medicaid Other | Source: Ambulatory Visit | Attending: Obstetrics & Gynecology | Admitting: Obstetrics & Gynecology

## 2012-08-16 DIAGNOSIS — O9A213 Injury, poisoning and certain other consequences of external causes complicating pregnancy, third trimester: Secondary | ICD-10-CM

## 2012-08-16 DIAGNOSIS — O0990 Supervision of high risk pregnancy, unspecified, unspecified trimester: Secondary | ICD-10-CM | POA: Insufficient documentation

## 2012-08-16 DIAGNOSIS — O0993 Supervision of high risk pregnancy, unspecified, third trimester: Secondary | ICD-10-CM

## 2012-08-16 DIAGNOSIS — N949 Unspecified condition associated with female genital organs and menstrual cycle: Secondary | ICD-10-CM | POA: Insufficient documentation

## 2012-08-16 DIAGNOSIS — O26893 Other specified pregnancy related conditions, third trimester: Secondary | ICD-10-CM

## 2012-08-16 DIAGNOSIS — O99891 Other specified diseases and conditions complicating pregnancy: Secondary | ICD-10-CM | POA: Insufficient documentation

## 2012-08-16 LAB — WET PREP, GENITAL

## 2012-08-16 NOTE — MAU Provider Note (Signed)
History     CSN: [id number]  Arrival date & time 08/16/12  2118   None     Chief Complaint  Patient presents with  . Rupture of Membranes    HPI  Claudia Gonzales is a 22 y.o. G2P0101 at [redacted]w[redacted]d who presents complaining of vaginal discharge.  She noticed the discharge earlier tonight. She felt it trickle out. Her underwear and pants got wet. Denies having any contractions or vaginal bleeding. Baby is moving well.   Past Medical History  Diagnosis Date  . Asthma   . Preterm labor   . Anxiety   . Chlamydia     Past Surgical History  Procedure Laterality Date  . Appendectomy    . Wisdom tooth extraction      Family History  Problem Relation Age of Onset  . Hypertension Mother   . Cancer Father   . Cancer Maternal Grandmother   . Cancer Maternal Grandfather   . Cancer Paternal Grandmother   . Cancer Paternal Grandfather     History  Substance Use Topics  . Smoking status: Former Smoker -- 0.50 packs/day for 1 years    Types: Cigarettes    Quit date: 06/17/2012  . Smokeless tobacco: Never Used  . Alcohol Use: No    OB History   Grav Para Term Preterm Abortions TAB SAB Ect Mult Living   2 1  1      1       Review of Systems  Allergies  Review of patient's allergies indicates no known allergies.  Home Medications  No current outpatient prescriptions on file.  BP 114/60  Pulse 95  Temp(Src) 98.5 F (36.9 C) (Oral)  Resp 18  Ht 5' 7.75" (1.721 m)  Wt 234 lb (106.142 kg)  BMI 35.84 kg/m2  SpO2 100%  LMP 11/14/2011  Physical Exam  Gen: NAD Lungs: NWOB Abd: gravid but otherwise soft and nontender to palpation Neuro: grossly nonfocal, speech intact GU: normal appearing external genitalia. Small to moderate amount of thin white fluid in vagina. No pooling of clear fluid. Dilation: 2 Effacement (%): 40 Cervical Position: Posterior Station: -3 Presentation: Vertex Exam by:: Laurell Josephs RN   MAU Course  Procedures (including critical care  time)  Labs Reviewed  WET PREP, GENITAL - Abnormal; Notable for the following:    Clue Cells Wet Prep HPF POC FEW (*)    WBC, Wet Prep HPF POC FEW (*)    All other components within normal limits   Fern slide negative  FHR: baseline 130s, moderate variability, + accels, no decels Toco: rare ctx/irritability   1. Traumatic injury during pregnancy, third trimester   2. Supervision of high risk pregnancy in third trimester   3. H/O premature delivery, third trimester   4. Vaginal discharge in pregnancy, third trimester     MDM  Claudia Gonzales is a 22 y.o. G2P0101 at [redacted]w[redacted]d who presents complaining of vaginal discharge. Not grossly ruptured (no pooling). Fern slide negative. Pt recently had gc/chlamydia cultures done on 4/21, which were negative. Wet prep shows few clue cells, but no odor noted during exam so will not treat for BV. FHR category I. Stable for discharge at this time with labor precautions.  Levert Feinstein, MD Family Medicine PGY-1  I have participated in the care of this patient and I agree with the above. Cam Hai 5:34 AM 08/17/2012

## 2012-08-16 NOTE — MAU Note (Signed)
Pt reports she had a gush of fluid at 8:45.  No contractions.

## 2012-08-18 NOTE — MAU Provider Note (Signed)
Attestation of Attending Supervision of Advanced Practitioner (CNM/NP): Evaluation and management procedures were performed by the Advanced Practitioner under my supervision and collaboration. I have reviewed the Advanced Practitioner's note and chart, and I agree with the management and plan.  Kohan Azizi H. 2:56 PM   

## 2012-08-19 ENCOUNTER — Inpatient Hospital Stay (HOSPITAL_COMMUNITY)
Admission: AD | Admit: 2012-08-19 | Discharge: 2012-08-20 | Disposition: A | Discharge status: Home / Self Care | Payer: Medicaid Other | Source: Ambulatory Visit | Attending: Obstetrics & Gynecology | Admitting: Obstetrics & Gynecology

## 2012-08-19 ENCOUNTER — Encounter (HOSPITAL_COMMUNITY): Payer: Self-pay | Admitting: *Deleted

## 2012-08-19 DIAGNOSIS — O9A213 Injury, poisoning and certain other consequences of external causes complicating pregnancy, third trimester: Secondary | ICD-10-CM

## 2012-08-19 DIAGNOSIS — O0993 Supervision of high risk pregnancy, unspecified, third trimester: Secondary | ICD-10-CM

## 2012-08-19 DIAGNOSIS — O479 False labor, unspecified: Secondary | ICD-10-CM | POA: Insufficient documentation

## 2012-08-19 MED ORDER — CYCLOBENZAPRINE HCL 10 MG PO TABS
10.0000 mg | ORAL_TABLET | Freq: Once | ORAL | Status: AC
  Administered 2012-08-19: 10 mg via ORAL
  Filled 2012-08-19: qty 1

## 2012-08-20 DIAGNOSIS — O479 False labor, unspecified: Secondary | ICD-10-CM

## 2012-08-20 LAB — CULTURE, OB URINE: Colony Count: >=100000

## 2012-08-20 MED ORDER — ZOLPIDEM TARTRATE 5 MG PO TABS
5.0000 mg | ORAL_TABLET | Freq: Every evening | ORAL | Status: DC | PRN
Start: 2012-08-20 — End: 2012-08-20
  Administered 2012-08-20: 5 mg via ORAL
  Filled 2012-08-20: qty 1

## 2012-08-20 NOTE — MAU Provider Note (Signed)
  History     CSN: [id number]  Arrival date and time: 08/19/12 2250   None     No chief complaint on file.  HPI Claudia Gonzales is a 21yo G2P0101 at 40.0wks who presents for eval of contractions. Denies leak or bldg. Reports +FM. No N/V/D or visual disturbances. She receives her prenatal care at [facility] due to her hx of preterm delivery.  OB History   Grav Para Term Preterm Abortions TAB SAB Ect Mult Living   2 1  1      1       Past Medical History  Diagnosis Date  . Asthma   . Preterm labor   . Anxiety   . Chlamydia     Past Surgical History  Procedure Laterality Date  . Appendectomy    . Wisdom tooth extraction      Family History  Problem Relation Age of Onset  . Hypertension Mother   . Cancer Father   . Cancer Maternal Grandmother   . Cancer Maternal Grandfather   . Cancer Paternal Grandmother   . Cancer Paternal Grandfather     History  Substance Use Topics  . Smoking status: Former Smoker -- 0.50 packs/day for 1 years    Types: Cigarettes    Quit date: 06/17/2012  . Smokeless tobacco: Never Used  . Alcohol Use: No    Allergies: No Known Allergies  Prescriptions prior to admission  Medication Sig Dispense Refill  . acetaminophen (TYLENOL) 325 MG tablet Take 325 mg by mouth every 6 (six) hours as needed for pain.      . cyclobenzaprine (FLEXERIL) 10 MG tablet Take 10 mg by mouth 3 (three) times daily as needed for muscle spasms.      . Prenatal Vit-Fe Fumarate-FA (PRENATAL MULTIVITAMIN) TABS Take 1 tablet by mouth daily at 12 noon.      . hydrocortisone-pramoxine (PROCTOFOAM HC) rectal foam Place 1 applicator rectally 2 (two) times daily as needed for hemorrhoids.  10 g  0    ROS Physical Exam   Blood pressure 112/70, pulse 86, temperature 98.7 F (37.1 C), temperature source Oral, resp. rate 20, height 5\' 6"  (1.676 m), weight 234 lb (106.142 kg), last menstrual period 11/14/2011.  Physical Exam  Constitutional: She is oriented to person, place, and  time. She appears well-developed.  HENT:  Head: Normocephalic.  Neck: Normal range of motion.  Cardiovascular: Normal rate.   Respiratory: Effort normal.  GI:  FHR 150s +accels, no decels Toco: irreg ctx q 4-74mins  Genitourinary: Vagina normal.  Cx unchanged on exams 1hr apart: 2.5/60/-2  Musculoskeletal: Normal range of motion.  Neurological: She is alert and oriented to person, place, and time.  Skin: Skin is warm and dry.  Psychiatric: She has a normal mood and affect. Her behavior is normal. Thought content normal.     MAU Course  Procedures    Assessment and Plan  IUP at 40.0wks Latent vs false labor  D/C home with labor precautions (pt said she would have fallen asleep in her MAU room if not for the FHR monitor noise)  Given Ambien 5mg  prior to d/c F/U at [facility] at next scheduled visit  SHAW, KIMBERLY 08/20/2012, 1:24 AM

## 2012-08-22 ENCOUNTER — Ambulatory Visit (INDEPENDENT_AMBULATORY_CARE_PROVIDER_SITE_OTHER): Payer: Medicaid Other | Admitting: Obstetrics and Gynecology

## 2012-08-22 ENCOUNTER — Encounter: Payer: Self-pay | Admitting: Family Medicine

## 2012-08-22 VITALS — BP 123/77 | Temp 97.2°F | Wt 232.4 lb

## 2012-08-22 DIAGNOSIS — O09219 Supervision of pregnancy with history of pre-term labor, unspecified trimester: Secondary | ICD-10-CM

## 2012-08-22 DIAGNOSIS — O9A213 Injury, poisoning and certain other consequences of external causes complicating pregnancy, third trimester: Secondary | ICD-10-CM

## 2012-08-22 DIAGNOSIS — O0993 Supervision of high risk pregnancy, unspecified, third trimester: Secondary | ICD-10-CM

## 2012-08-22 LAB — POCT URINALYSIS DIP (DEVICE)
Bilirubin Urine: NEGATIVE
Glucose, UA: NEGATIVE mg/dL
Hgb urine dipstick: NEGATIVE
Ketones, ur: NEGATIVE mg/dL
Specific Gravity, Urine: 1.025 (ref 1.005–1.030)

## 2012-08-22 NOTE — Progress Notes (Signed)
IOL scheduled 08/27/12 at 730 am.

## 2012-08-22 NOTE — Progress Notes (Signed)
Patient doing well. FM/labor precautions discussed. Will start postdate fetal testing tomorrow. Patient scheduled for IOL at 7:30 on 5/24.

## 2012-08-22 NOTE — Progress Notes (Signed)
Pulse- 71 Patient reports abdominal and pelvic pressure

## 2012-08-23 ENCOUNTER — Encounter (HOSPITAL_COMMUNITY): Payer: Self-pay | Admitting: Anesthesiology

## 2012-08-23 ENCOUNTER — Encounter (HOSPITAL_COMMUNITY): Payer: Self-pay | Admitting: *Deleted

## 2012-08-23 ENCOUNTER — Telehealth (HOSPITAL_COMMUNITY): Payer: Self-pay | Admitting: *Deleted

## 2012-08-23 ENCOUNTER — Inpatient Hospital Stay (HOSPITAL_COMMUNITY)
Admission: AD | Admit: 2012-08-23 | Discharge: 2012-08-25 | DRG: 775 | Disposition: A | Discharge status: Home / Self Care | Payer: Medicaid Other | Source: Ambulatory Visit | Attending: Family Medicine | Admitting: Family Medicine

## 2012-08-23 ENCOUNTER — Ambulatory Visit (INDEPENDENT_AMBULATORY_CARE_PROVIDER_SITE_OTHER): Payer: Medicaid Other | Admitting: *Deleted

## 2012-08-23 ENCOUNTER — Inpatient Hospital Stay (HOSPITAL_COMMUNITY): Payer: Medicaid Other | Admitting: Anesthesiology

## 2012-08-23 VITALS — BP 116/65

## 2012-08-23 DIAGNOSIS — Z22330 Carrier of Group B streptococcus: Secondary | ICD-10-CM

## 2012-08-23 DIAGNOSIS — O480 Post-term pregnancy: Secondary | ICD-10-CM

## 2012-08-23 DIAGNOSIS — O9A213 Injury, poisoning and certain other consequences of external causes complicating pregnancy, third trimester: Secondary | ICD-10-CM

## 2012-08-23 DIAGNOSIS — O0993 Supervision of high risk pregnancy, unspecified, third trimester: Secondary | ICD-10-CM

## 2012-08-23 DIAGNOSIS — O99892 Other specified diseases and conditions complicating childbirth: Secondary | ICD-10-CM | POA: Diagnosis present

## 2012-08-23 LAB — TYPE AND SCREEN: Antibody Screen: NEGATIVE

## 2012-08-23 LAB — CBC
HCT: 30 % — ABNORMAL LOW (ref 36.0–46.0)
Hemoglobin: 10.2 g/dL — ABNORMAL LOW (ref 12.0–15.0)
WBC: 7.3 10*3/uL (ref 4.0–10.5)

## 2012-08-23 MED ORDER — LIDOCAINE HCL (PF) 1 % IJ SOLN
30.0000 mL | INTRAMUSCULAR | Status: DC | PRN
  Filled 2012-08-23 (×2): qty 30

## 2012-08-23 MED ORDER — OXYTOCIN 40 UNITS IN LACTATED RINGERS INFUSION - SIMPLE MED
62.5000 mL/h | INTRAVENOUS | Status: DC
  Filled 2012-08-23: qty 1000

## 2012-08-23 MED ORDER — OXYCODONE-ACETAMINOPHEN 5-325 MG PO TABS: 1.0000 | ORAL_TABLET | ORAL | Status: DC | PRN

## 2012-08-23 MED ORDER — PHENYLEPHRINE 40 MCG/ML (10ML) SYRINGE FOR IV PUSH (FOR BLOOD PRESSURE SUPPORT)
80.0000 ug | PREFILLED_SYRINGE | INTRAVENOUS | Status: DC | PRN
  Filled 2012-08-23: qty 2

## 2012-08-23 MED ORDER — EPHEDRINE 5 MG/ML INJ
10.0000 mg | INTRAVENOUS | Status: DC | PRN
  Filled 2012-08-23: qty 2
  Filled 2012-08-23 (×2): qty 4

## 2012-08-23 MED ORDER — EPHEDRINE 5 MG/ML INJ
10.0000 mg | INTRAVENOUS | Status: DC | PRN
  Administered 2012-08-24: 10 mg via INTRAVENOUS
  Filled 2012-08-23: qty 2

## 2012-08-23 MED ORDER — CITRIC ACID-SODIUM CITRATE 334-500 MG/5ML PO SOLN
30.0000 mL | ORAL | Status: DC | PRN
  Filled 2012-08-23: qty 15

## 2012-08-23 MED ORDER — LACTATED RINGERS IV SOLN
500.0000 mL | INTRAVENOUS | Status: DC | PRN
  Administered 2012-08-24 (×2): 300 mL via INTRAVENOUS

## 2012-08-23 MED ORDER — LACTATED RINGERS IV SOLN
500.0000 mL | Freq: Once | INTRAVENOUS | Status: AC
  Administered 2012-08-23: 500 mL via INTRAVENOUS

## 2012-08-23 MED ORDER — IBUPROFEN 600 MG PO TABS: 600.0000 mg | ORAL_TABLET | Freq: Four times a day (QID) | ORAL | Status: DC | PRN

## 2012-08-23 MED ORDER — FENTANYL 2.5 MCG/ML BUPIVACAINE 1/10 % EPIDURAL INFUSION (WH - ANES)
14.0000 mL/h | INTRAMUSCULAR | Status: DC | PRN
  Administered 2012-08-23 – 2012-08-24 (×2): 14 mL/h via EPIDURAL
  Filled 2012-08-23 (×2): qty 125

## 2012-08-23 MED ORDER — PHENYLEPHRINE 40 MCG/ML (10ML) SYRINGE FOR IV PUSH (FOR BLOOD PRESSURE SUPPORT)
80.0000 ug | PREFILLED_SYRINGE | INTRAVENOUS | Status: DC | PRN
  Filled 2012-08-23: qty 2
  Filled 2012-08-23: qty 5

## 2012-08-23 MED ORDER — FENTANYL CITRATE 0.05 MG/ML IJ SOLN: 100.0000 ug | INTRAMUSCULAR | Status: DC | PRN

## 2012-08-23 MED ORDER — PENICILLIN G POTASSIUM 5000000 UNITS IJ SOLR
5.0000 10*6.[IU] | Freq: Once | INTRAVENOUS | Status: AC
  Administered 2012-08-23: 5 10*6.[IU] via INTRAVENOUS
  Filled 2012-08-23: qty 5

## 2012-08-23 MED ORDER — ONDANSETRON HCL 4 MG/2ML IJ SOLN: 4.0000 mg | Freq: Four times a day (QID) | INTRAMUSCULAR | Status: DC | PRN

## 2012-08-23 MED ORDER — LACTATED RINGERS IV SOLN
INTRAVENOUS | Status: DC
  Administered 2012-08-23 (×2): via INTRAVENOUS

## 2012-08-23 MED ORDER — OXYTOCIN 40 UNITS IN LACTATED RINGERS INFUSION - SIMPLE MED
1.0000 m[IU]/min | INTRAVENOUS | Status: DC
  Administered 2012-08-23: 2 m[IU]/min via INTRAVENOUS

## 2012-08-23 MED ORDER — ACETAMINOPHEN 325 MG PO TABS: 650.0000 mg | ORAL_TABLET | ORAL | Status: DC | PRN

## 2012-08-23 MED ORDER — SODIUM BICARBONATE 8.4 % IV SOLN
INTRAVENOUS | Status: DC | PRN
  Administered 2012-08-23: 5 mL via EPIDURAL

## 2012-08-23 MED ORDER — FLEET ENEMA 7-19 GM/118ML RE ENEM: 1.0000 | ENEMA | RECTAL | Status: DC | PRN

## 2012-08-23 MED ORDER — TERBUTALINE SULFATE 1 MG/ML IJ SOLN: 0.2500 mg | Freq: Once | INTRAMUSCULAR | Status: AC | PRN

## 2012-08-23 MED ORDER — DIPHENHYDRAMINE HCL 50 MG/ML IJ SOLN: 12.5000 mg | INTRAMUSCULAR | Status: DC | PRN

## 2012-08-23 MED ORDER — OXYTOCIN BOLUS FROM INFUSION
500.0000 mL | INTRAVENOUS | Status: DC
  Administered 2012-08-24: 500 mL via INTRAVENOUS

## 2012-08-23 MED ORDER — PENICILLIN G POTASSIUM 5000000 UNITS IJ SOLR
2.5000 10*6.[IU] | INTRAVENOUS | Status: DC
  Administered 2012-08-24 (×3): 2.5 10*6.[IU] via INTRAVENOUS
  Filled 2012-08-23 (×5): qty 2.5

## 2012-08-23 NOTE — Progress Notes (Signed)
P = 87   Labor sx reviewed.  IOL scheduled on 08/27/12 @ 0730

## 2012-08-23 NOTE — Telephone Encounter (Signed)
Preadmission screen  

## 2012-08-23 NOTE — Anesthesia Procedure Notes (Signed)

## 2012-08-23 NOTE — H&P (Signed)
HPI: Claudia Gonzales is a 22 y.o. year old G15P0101 female at [redacted]w[redacted]d weeks by LMP gestation who presents to MAU reporting Labor. She received care at [facility] due to Hx PTD. Denies LOF or VB. Pos CT 09/2011. TOC neg x 2. GBS pos urine.  History OB History   Grav Para Term Preterm Abortions TAB SAB Ect Mult Living   2 1  1      1      Past Medical History  Diagnosis Date  . Asthma   . Preterm labor   . Anxiety   . Chlamydia    Past Surgical History  Procedure Laterality Date  . Appendectomy    . Wisdom tooth extraction     Family History: family history includes Cancer in her father, maternal grandfather, maternal grandmother, paternal grandfather, and paternal grandmother and Hypertension in her mother. Social History:  reports that she quit smoking about 2 months ago. Her smoking use included Cigarettes. She has a .5 pack-year smoking history. She has never used smokeless tobacco. She reports that she does not drink alcohol or use illicit drugs.   Prenatal Transfer Tool  Maternal Diabetes: No Genetic Screening: Declined Maternal Ultrasounds/Referrals: Normal Fetal Ultrasounds or other Referrals:  None Maternal Substance Abuse:  No Significant Maternal Medications:  None Significant Maternal Lab Results:  Lab values include: Group B Strep positive Other Comments:     Review of Systems  Constitutional: Negative for fever and chills.  Eyes: Negative for blurred vision.  Cardiovascular: Negative for chest pain.  Gastrointestinal: Positive for abdominal pain (UC;s).  Neurological: Negative for headaches.    Dilation: 4.5 Effacement (%): 50 Station: -2 Blood pressure 126/81, pulse 86, temperature 98 F (36.7 C), temperature source Oral, resp. rate 20, last menstrual period 11/14/2011. Maternal Exam:  Uterine Assessment: Contraction strength is firm.  Contraction frequency is regular.   Abdomen: Fetal presentation: vertex  Introitus: Normal vulva. Normal vagina.  Per  RN  Cervix: Cervix evaluated by digital exam.     Fetal Exam Fetal Monitor Review: Mode: ultrasound.   Baseline rate: 125.  Variability: moderate (6-25 bpm).   Pattern: accelerations present and no decelerations.    Fetal State Assessment: Category I - tracings are normal.     Physical Exam  Nursing note and vitals reviewed. Constitutional: She is oriented to person, place, and time. She appears well-developed and well-nourished. She appears distressed.  Cardiovascular: Normal rate and regular rhythm.   Respiratory: Effort normal and breath sounds normal.  GI: Soft. There is no tenderness.  Musculoskeletal: Normal range of motion.  Neurological: She is alert and oriented to person, place, and time.  Skin: Skin is warm and dry.  Psychiatric: She has a normal mood and affect.    Prenatal labs: ABO, Rh: --/--/B POS (04/17 2115) Antibody: NEG (04/17 2115) Rubella: 11.00 (12/11 0925) RPR: NON REACTIVE (12/11 0925)  HBsAg: NEGATIVE (12/11 0925)  HIV: NON REACTIVE (12/11 1610)  GBS: Positive (05/12 0000)  Genetic screen: Declined.  Assessment: 1. Labor: active 2. Fetal Wellbeing: Category I  3. Pain Control: Requesting epidural 4. GBS: pos in urine 5. 40.3 week IUP  Plan:  1. Admit to BS per consult with MD 2. Routine L&D orders 3. Analgesia/anesthesia PRN  4. PCN  Dorathy Kinsman 08/23/2012, 7:35 PM

## 2012-08-23 NOTE — H&P (Signed)
Chart reviewed and agree with management and plan.  

## 2012-08-23 NOTE — Anesthesia Preprocedure Evaluation (Signed)
Anesthesia Evaluation  Patient identified by MRN, date of birth, ID band Patient awake    Reviewed: Allergy & Precautions, H&P , Patient's Chart, lab work & pertinent test results  Airway Mallampati: II TM Distance: >3 FB Neck ROM: full    Dental  (+) Teeth Intact   Pulmonary asthma ,  breath sounds clear to auscultation        Cardiovascular Rhythm:regular Rate:Normal     Neuro/Psych    GI/Hepatic   Endo/Other  Morbid obesity  Renal/GU      Musculoskeletal   Abdominal   Peds  Hematology   Anesthesia Other Findings  No Inhaler use     Reproductive/Obstetrics (+) Pregnancy                           Anesthesia Physical Anesthesia Plan  ASA: III  Anesthesia Plan: Epidural   Post-op Pain Management:    Induction:   Airway Management Planned:   Additional Equipment:   Intra-op Plan:   Post-operative Plan:   Informed Consent: I have reviewed the patients History and Physical, chart, labs and discussed the procedure including the risks, benefits and alternatives for the proposed anesthesia with the patient or authorized representative who has indicated his/her understanding and acceptance.   Dental Advisory Given  Plan Discussed with:   Anesthesia Plan Comments: (Labs checked- platelets confirmed with RN in room. Fetal heart tracing, per RN, reported to be stable enough for sitting procedure. Discussed epidural, and patient consents to the procedure:  included risk of possible headache,backache, failed block, allergic reaction, and nerve injury. This patient was asked if she had any questions or concerns before the procedure started. )        Anesthesia Quick Evaluation

## 2012-08-24 ENCOUNTER — Encounter (HOSPITAL_COMMUNITY): Payer: Self-pay | Admitting: *Deleted

## 2012-08-24 DIAGNOSIS — Z22330 Carrier of Group B streptococcus: Secondary | ICD-10-CM

## 2012-08-24 DIAGNOSIS — O9989 Other specified diseases and conditions complicating pregnancy, childbirth and the puerperium: Secondary | ICD-10-CM

## 2012-08-24 MED ORDER — ONDANSETRON HCL 4 MG PO TABS: 4.0000 mg | ORAL_TABLET | ORAL | Status: DC | PRN

## 2012-08-24 MED ORDER — TETANUS-DIPHTH-ACELL PERTUSSIS 5-2.5-18.5 LF-MCG/0.5 IM SUSP: 0.5000 mL | Freq: Once | INTRAMUSCULAR | Status: DC

## 2012-08-24 MED ORDER — IBUPROFEN 600 MG PO TABS
600.0000 mg | ORAL_TABLET | Freq: Four times a day (QID) | ORAL | Status: DC
  Administered 2012-08-24 – 2012-08-25 (×5): 600 mg via ORAL
  Filled 2012-08-24 (×5): qty 1

## 2012-08-24 MED ORDER — SENNOSIDES-DOCUSATE SODIUM 8.6-50 MG PO TABS
2.0000 | ORAL_TABLET | Freq: Every day | ORAL | Status: DC
  Administered 2012-08-24: 2 via ORAL

## 2012-08-24 MED ORDER — BENZOCAINE-MENTHOL 20-0.5 % EX AERO
1.0000 "application " | INHALATION_SPRAY | CUTANEOUS | Status: DC | PRN
  Administered 2012-08-24: 1 via TOPICAL
  Filled 2012-08-24: qty 56

## 2012-08-24 MED ORDER — DIPHENHYDRAMINE HCL 25 MG PO CAPS: 25.0000 mg | ORAL_CAPSULE | Freq: Four times a day (QID) | ORAL | Status: DC | PRN

## 2012-08-24 MED ORDER — PRENATAL MULTIVITAMIN CH
1.0000 | ORAL_TABLET | Freq: Every day | ORAL | Status: DC
  Administered 2012-08-24 – 2012-08-25 (×2): 1 via ORAL
  Filled 2012-08-24 (×2): qty 1

## 2012-08-24 MED ORDER — LANOLIN HYDROUS EX OINT: TOPICAL_OINTMENT | CUTANEOUS | Status: DC | PRN

## 2012-08-24 MED ORDER — WITCH HAZEL-GLYCERIN EX PADS: 1.0000 "application " | MEDICATED_PAD | CUTANEOUS | Status: DC | PRN

## 2012-08-24 MED ORDER — DIBUCAINE 1 % RE OINT: 1.0000 "application " | TOPICAL_OINTMENT | RECTAL | Status: DC | PRN

## 2012-08-24 MED ORDER — LACTATED RINGERS IV SOLN
INTRAVENOUS | Status: DC
  Administered 2012-08-24 (×2): via INTRAUTERINE

## 2012-08-24 MED ORDER — ONDANSETRON HCL 4 MG/2ML IJ SOLN: 4.0000 mg | INTRAMUSCULAR | Status: DC | PRN

## 2012-08-24 MED ORDER — SIMETHICONE 80 MG PO CHEW: 80.0000 mg | CHEWABLE_TABLET | ORAL | Status: DC | PRN

## 2012-08-24 MED ORDER — ZOLPIDEM TARTRATE 5 MG PO TABS: 5.0000 mg | ORAL_TABLET | Freq: Every evening | ORAL | Status: DC | PRN

## 2012-08-24 MED ORDER — OXYCODONE-ACETAMINOPHEN 5-325 MG PO TABS: 1.0000 | ORAL_TABLET | ORAL | Status: DC | PRN

## 2012-08-24 MED ORDER — OXYTOCIN 40 UNITS IN LACTATED RINGERS INFUSION - SIMPLE MED: 1.0000 m[IU]/min | INTRAVENOUS | Status: DC

## 2012-08-24 NOTE — Progress Notes (Signed)
Claudia Gonzales is a 22 y.o. G2P0101 at [redacted]w[redacted]d  Subjective: CTSP for late onset FHR variables with each ctx; SROM with exam for clear fluid with cx 9/C/0; used position changes, O2, d/c Pitocin, amnioinfusion and ephedrine for low BP and now FHR is back to baseline and has maintained variability throughout  Objective: BP 103/59  Pulse 78  Temp(Src) 97.4 F (36.3 C) (Oral)  Resp 18  Ht 5\' 6"  (1.676 m)  Wt 232 lb (105.235 kg)  BMI 37.46 kg/m2  SpO2 100%  LMP 11/14/2011      FHT:  FHR: 130 bpm, variability: moderate,  accelerations:  Present,  decelerations:  Present : nadir of 60 during decels; now resolved UC:   regular, every 4 minutes with Pitocin off (beginning to space out) SVE:   Dilation: 9 Effacement (%): 100 Station: 0 Exam by:: k shaw,cnm +SS with each exam  Labs: Lab Results  Component Value Date   WBC 7.3 08/23/2012   HGB 10.2* 08/23/2012   HCT 30.0* 08/23/2012   MCV 82.4 08/23/2012   PLT 149* 08/23/2012    Assessment / Plan: Transition FHR changes  Will allow FHR to recover over next hour with Pitocin off and amnioinfusion going; will reassess the need to restart Pitocin after FHR stable  SHAW, KIMBERLY 08/24/2012, 3:42 AM

## 2012-08-24 NOTE — Anesthesia Postprocedure Evaluation (Signed)
Anesthesia Post Note  Patient: Engineer, technical sales  Procedure(s) Performed: * No procedures listed *  Anesthesia type: Epidural  Patient location: Mother/Baby  Post pain: Pain level controlled  Post assessment: Post-op Vital signs reviewed  Last Vitals:  Filed Vitals:   08/24/12 1805  BP: 110/73  Pulse: 78  Temp: 36.5 C  Resp: 18    Post vital signs: Reviewed  Level of consciousness:alert  Complications: No apparent anesthesia complications

## 2012-08-25 MED ORDER — IBUPROFEN 600 MG PO TABS: 600.0000 mg | ORAL_TABLET | Freq: Four times a day (QID) | ORAL | Status: DC

## 2012-08-25 NOTE — Progress Notes (Signed)
UR chart review completed.  

## 2012-08-25 NOTE — Progress Notes (Signed)
5/20 NST reviewed and reactive

## 2012-08-25 NOTE — Discharge Summary (Signed)
Obstetric Discharge Summary Reason for Admission: onset of labor Prenatal Procedures: none Intrapartum Procedures: spontaneous vaginal delivery Postpartum Procedures: none Complications-Operative and Postpartum: none Hemoglobin  Date Value Range Status  08/23/2012 10.2* 12.0 - 15.0 g/dL Final     HCT  Date Value Range Status  08/23/2012 30.0* 36.0 - 46.0 % Final    Physical Exam:  General: alert, cooperative and no distress Lungs: nl effort Heart: RRR Lochia: appropriate Uterine Fundus: firm DVT Evaluation: No evidence of DVT seen on physical exam.  Discharge Diagnoses: Term Pregnancy-delivered  Discharge Information: Date: 08/25/2012 Activity: pelvic rest Diet: routine Medications: PNV and Ibuprofen Condition: stable Instructions: refer to practice specific booklet Discharge to: home Follow-up Information   Follow up with [facility]. (Make a 6 week postpartum appointment.)    Contact information:   [address] [place] [address] [phone]      Newborn Data: Live born female  Birth Weight: 7 lb 14.6 oz (3589 g) APGAR: 8, 9  Home with mother. Pumping and giving breastmilk via bottle- did this with her older child for over one year. Plans Mirena for contraception.  Cam Hai 08/25/2012, 9:56 AM
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
## 2012-08-27 ENCOUNTER — Inpatient Hospital Stay (HOSPITAL_COMMUNITY): Admission: RE | Admit: 2012-08-27 | Payer: Medicaid Other | Source: Ambulatory Visit

## 2012-08-31 NOTE — Discharge Summary (Signed)
Attestation of Attending Supervision of Advanced Practitioner: Evaluation and management procedures were performed by the PA/NP/CNM/OB Fellow under my supervision/collaboration. Chart reviewed and agree with management and plan.  Silva Aamodt V 08/31/2012 12:39 PM   

## 2012-10-03 ENCOUNTER — Encounter: Payer: Self-pay | Admitting: Advanced Practice Midwife

## 2012-10-03 ENCOUNTER — Ambulatory Visit (INDEPENDENT_AMBULATORY_CARE_PROVIDER_SITE_OTHER): Payer: Medicaid Other | Admitting: Advanced Practice Midwife

## 2012-10-03 VITALS — BP 127/81 | HR 81 | Temp 97.4°F | Ht 66.5 in | Wt 208.8 lb

## 2012-10-03 DIAGNOSIS — Z30430 Encounter for insertion of intrauterine contraceptive device: Secondary | ICD-10-CM

## 2012-10-03 DIAGNOSIS — Z01812 Encounter for preprocedural laboratory examination: Secondary | ICD-10-CM

## 2012-10-03 MED ORDER — LEVONORGESTREL 20 MCG/24HR IU IUD: 1.0000 | INTRAUTERINE_SYSTEM | Freq: Once | INTRAUTERINE | Status: DC

## 2012-10-03 MED ORDER — LEVONORGESTREL 20 MCG/24HR IU IUD
INTRAUTERINE_SYSTEM | Freq: Once | INTRAUTERINE | Status: AC
  Administered 2012-10-03: 1 via INTRAUTERINE

## 2012-10-03 NOTE — Progress Notes (Signed)
  Subjective:    Patient ID: Claudia Gonzales, female    DOB: 1990/11/02, 22 y.o.   MRN: [id number]  HPI    Review of Systems     Objective:   Physical Exam Patient identified, informed consent performed, signed copy in chart, time out was performed.  Urine pregnancy test negative.  Speculum placed in the vagina.  Cervix visualized.  Cleaned with Betadine x 2.  Grasped anteriourly with a single tooth tenaculum.  Uterus sounded to 6cm.  Mirena IUD placed per manufacturer's recommendations.  Strings trimmed to 3 cm.   Patient given post procedure instructions and Mirena care card with expiration date.  Patient is asked to check IUD strings periodically and follow up in 4-6 weeks for IUD check.        Assessment & Plan:   Subjective:     Claudia Gonzales is a 23 y.o. female who presents for a postpartum visit. She is 6 weeks postpartum following a spontaneous vaginal delivery. I have fully reviewed the prenatal and intrapartum course. The delivery was at 40.3 gestational weeks. Outcome: spontaneous vaginal delivery. Anesthesia: epidural. Postpartum course has been uneventful. Baby's course has been uneventful. Baby is feeding by breast. Bleeding staining only. Bowel function is normal. Bladder function is normal. Patient is not sexually active. Contraception method is IUD. Postpartum depression screening: negative.  The following portions of the patient's history were reviewed and updated as appropriate: allergies, current medications, past family history, past medical history, past social history, past surgical history and problem list.  Review of Systems Pertinent items are noted in HPI.   Objective:    BP 127/81  Pulse 81  Temp(Src) 97.4 F (36.3 C) (Oral)  Ht 5' 6.5" (1.689 m)  Wt 94.711 kg (208 lb 12.8 oz)  BMI 33.2 kg/m2  LMP 09/30/2012  Breastfeeding? Yes  General:  alert, cooperative and no distress   Breasts:  inspection negative, no nipple discharge or bleeding, no  masses or nodularity palpable  Lungs: clear to auscultation bilaterally  Heart:  regular rate and rhythm, S1, S2 normal, no murmur, click, rub or gallop  Abdomen: soft, non-tender; bowel sounds normal; no masses,  no organomegaly   Vulva:  normal  Vagina: normal vagina, no discharge, exudate, lesion, or erythema  Cervix:  multiparous appearance  Corpus: normal size, contour, position, consistency, mobility, non-tender  Adnexa:  no mass, fullness, tenderness  Rectal Exam: Not performed.        She presents for insertion of Mirena IUD today.   Patient identified, informed consent performed, signed copy in chart, time out was performed.  Urine pregnancy test negative.  Speculum placed in the vagina.  Cervix visualized.  Cleaned with Betadine x 2.  Grasped anteriourly with a single tooth tenaculum.  Uterus sounded to 6cm.  Mirena IUD placed per manufacturer's recommendations.  Strings trimmed to 3 cm.   Patient given post procedure instructions and Mirena care card with expiration date.  Patient is asked to check IUD strings periodically and follow up in 4-6 weeks for IUD check. Assessment:     Normal postpartum exam. Pap smear not done at today's visit.   Plan:    1. Contraception: IUD 2. Discussed use of ibuprofen for cramping 3. Follow up in: 1 year or as needed.

## 2012-10-03 NOTE — Patient Instructions (Addendum)

## 2013-03-31 ENCOUNTER — Emergency Department (HOSPITAL_COMMUNITY)
Admission: EM | Admit: 2013-03-31 | Discharge: 2013-03-31 | Disposition: A | Discharge status: Home / Self Care | Payer: Medicaid Other | Attending: Emergency Medicine | Admitting: Emergency Medicine

## 2013-03-31 ENCOUNTER — Encounter (HOSPITAL_COMMUNITY): Payer: Self-pay | Admitting: Emergency Medicine

## 2013-03-31 DIAGNOSIS — Z8659 Personal history of other mental and behavioral disorders: Secondary | ICD-10-CM | POA: Insufficient documentation

## 2013-03-31 DIAGNOSIS — Z87891 Personal history of nicotine dependence: Secondary | ICD-10-CM | POA: Insufficient documentation

## 2013-03-31 DIAGNOSIS — Z8619 Personal history of other infectious and parasitic diseases: Secondary | ICD-10-CM | POA: Insufficient documentation

## 2013-03-31 DIAGNOSIS — K089 Disorder of teeth and supporting structures, unspecified: Secondary | ICD-10-CM | POA: Insufficient documentation

## 2013-03-31 DIAGNOSIS — K0889 Other specified disorders of teeth and supporting structures: Secondary | ICD-10-CM

## 2013-03-31 DIAGNOSIS — J45909 Unspecified asthma, uncomplicated: Secondary | ICD-10-CM | POA: Insufficient documentation

## 2013-03-31 MED ORDER — HYDROCODONE-ACETAMINOPHEN 5-325 MG PO TABS: 1.0000 | ORAL_TABLET | ORAL | Status: DC | PRN

## 2013-03-31 NOTE — ED Notes (Signed)
Pt did not come when called for triage x2

## 2013-03-31 NOTE — ED Provider Notes (Signed)
CSN: [id number]     Arrival date & time 03/31/13  2129 History  This chart was scribed for non-physician practitioner, Cherrie Distance, PA-C,working with Junius Argyle, MD, by Karle Plumber, ED Scribe.  This patient was seen in room WTR5/WTR5 and the patient's care was started at 10:19 PM.  Chief Complaint  Patient presents with  . Dental Pain   The history is provided by the patient. No language interpreter was used.   HPI Comments:  Claudia Gonzales is a 22 y.o. female who presents to the Emergency Department complaining of dental pain secondary to having four teeth extracted three days ago. She reports associated facial swelling. Pt denies fever and chills.    Past Medical History  Diagnosis Date  . Asthma   . Preterm labor   . Anxiety   . Chlamydia    Past Surgical History  Procedure Laterality Date  . Appendectomy    . Wisdom tooth extraction     Family History  Problem Relation Age of Onset  . Hypertension Mother   . Cancer Father   . Cancer Maternal Grandmother   . Cancer Maternal Grandfather   . Cancer Paternal Grandmother   . Cancer Paternal Grandfather    History  Substance Use Topics  . Smoking status: Former Smoker -- 0.50 packs/day for 1 years    Types: Cigarettes    Quit date: 06/17/2012  . Smokeless tobacco: Never Used  . Alcohol Use: No   OB History   Grav Para Term Preterm Abortions TAB SAB Ect Mult Living   2 2 1 1      2      Review of Systems  Constitutional: Negative for fever and chills.  HENT: Positive for dental problem and facial swelling.   All other systems reviewed and are negative.    Allergies  Review of patient's allergies indicates no known allergies.  Home Medications   Current Outpatient Rx  Name  Route  Sig  Dispense  Refill  . ibuprofen (ADVIL,MOTRIN) 600 MG tablet   Oral   Take 1 tablet (600 mg total) by mouth every 6 (six) hours.   50 tablet   1    Triage Vitals: BP 135/77  Pulse 79  Temp(Src) 98.4 F  (36.9 C) (Oral)  Resp 20  SpO2 100% Physical Exam  Nursing note and vitals reviewed. Constitutional: She is oriented to person, place, and time. She appears well-developed and well-nourished.  HENT:  Head: Normocephalic and atraumatic.  Mouth/Throat: No trismus in the jaw. Abnormal dentition. No dental caries.    Wisdom teeth surgically removed, sutures intact, mucous membranes moist, no edema, abscess noted.  Eyes: EOM are normal.  Neck: Normal range of motion.  Cardiovascular: Normal rate.   Pulmonary/Chest: Effort normal.  Musculoskeletal: Normal range of motion.  Neurological: She is alert and oriented to person, place, and time.  Skin: Skin is warm and dry.  Psychiatric: She has a normal mood and affect. Her behavior is normal.    ED Course  Procedures (including critical care time) DIAGNOSTIC STUDIES: Oxygen Saturation is 100% on RA, normal by my interpretation.   COORDINATION OF CARE: 10:20 PM- Will prescribe Vicodin for pain. Pt verbalizes understanding and agrees to plan.  Medications - No data to display  Labs Review Labs Reviewed - No data to display Imaging Review No results found.  EKG Interpretation   None       MDM  Dental pain  Patient here after running out of her pain  medication, no evidence of abscess, no trismus, no PTA or concern for ludwigs angina.  I personally performed the services described in this documentation, which was scribed in my presence. The recorded information has been reviewed and is accurate.   Izola Price Marisue Humble, PA-C 03/31/13 2230

## 2013-03-31 NOTE — ED Notes (Signed)
Pt in stating earlier this week she had 4 teeth removed, states she is out of pain medication and has to work tomorrow, states her jaw is still swollen

## 2013-04-01 NOTE — ED Provider Notes (Signed)
Medical screening examination/treatment/procedure(s) were performed by non-physician practitioner and as supervising physician I was immediately available for consultation/collaboration.  EKG Interpretation   None         Junius Argyle, MD 04/01/13 1302

## 2013-07-26 ENCOUNTER — Encounter (HOSPITAL_COMMUNITY): Payer: Self-pay | Admitting: Emergency Medicine

## 2013-07-26 ENCOUNTER — Emergency Department (INDEPENDENT_AMBULATORY_CARE_PROVIDER_SITE_OTHER)
Admission: EM | Admit: 2013-07-26 | Discharge: 2013-07-26 | Disposition: A | Discharge status: Home / Self Care | Payer: Medicaid Other | Source: Home / Self Care | Attending: Family Medicine | Admitting: Family Medicine

## 2013-07-26 DIAGNOSIS — M545 Low back pain, unspecified: Secondary | ICD-10-CM

## 2013-07-26 LAB — POCT URINALYSIS DIP (DEVICE)
Bilirubin Urine: NEGATIVE
Glucose, UA: NEGATIVE mg/dL
Ketones, ur: NEGATIVE mg/dL
Nitrite: NEGATIVE
Protein, ur: NEGATIVE mg/dL
Specific Gravity, Urine: >=1.03 (ref 1.005–1.030)
Urobilinogen, UA: 0.2 mg/dL (ref 0.0–1.0)
pH: 6 (ref 5.0–8.0)

## 2013-07-26 LAB — POCT PREGNANCY, URINE: Preg Test, Ur: NEGATIVE

## 2013-07-26 MED ORDER — IBUPROFEN 400 MG PO TABS: 400.0000 mg | ORAL_TABLET | Freq: Four times a day (QID) | ORAL | Status: DC | PRN

## 2013-07-26 NOTE — ED Provider Notes (Signed)
CSN: [id number]     Arrival date & time 07/26/13  1631 History   First MD Initiated Contact with Patient 07/26/13 1729     Chief Complaint  Patient presents with  . Back Pain  . Knee Pain   (Consider location/radiation/quality/duration/timing/severity/associated sxs/prior Treatment) HPI Comments: LNMP: beginning of March 2015. States she has a [employer] fast streamerMirena IUD.   Patient is a 23 y.o. female presenting with back pain and knee pain. The history is provided by the patient.  Back Pain Location:  Lumbar spine Quality:  Cramping Radiates to:  Does not radiate Pain severity:  Mild Onset quality:  Gradual Duration:  4 days Progression:  Waxing and waning Chronicity:  New Ineffective treatments:  None tried Associated symptoms: no abdominal pain, no abdominal swelling, no bladder incontinence, no bowel incontinence, no chest pain, no dysuria, no fever, no headaches, no leg pain, no numbness, no paresthesias, no pelvic pain, no perianal numbness, no tingling, no weakness and no weight loss   Associated symptoms comment:  3-4 days of vaginal spotting. No discharge Knee Pain Associated symptoms: back pain   Associated symptoms: no fatigue and no fever     Past Medical History  Diagnosis Date  . Asthma   . Preterm labor   . Anxiety   . Chlamydia    Past Surgical History  Procedure Laterality Date  . Appendectomy    . Wisdom tooth extraction     Family History  Problem Relation Age of Onset  . Hypertension Mother   . Cancer Father     colon  . Cancer Maternal Grandmother   . Cancer Maternal Grandfather   . Cancer Paternal Grandmother   . Cancer Paternal Grandfather    History  Substance Use Topics  . Smoking status: Former Smoker -- 0.50 packs/day for 1 years    Types: Cigarettes    Quit date: 06/17/2012  . Smokeless tobacco: Never Used  . Alcohol Use: 4.2 oz/week    7 Glasses of wine per week     Comment: glass of wine /day   OB History   Grav Para Term Preterm Abortions  TAB SAB Ect Mult Living   2 2 1 1      2      Review of Systems  Constitutional: Negative for fever, weight loss and fatigue.  HENT: Negative.   Eyes: Negative.   Respiratory: Negative.   Cardiovascular: Negative for chest pain.  Gastrointestinal: Negative for nausea, vomiting, abdominal pain, diarrhea and bowel incontinence.  Genitourinary: Negative for bladder incontinence, dysuria, frequency, flank pain, vaginal discharge, difficulty urinating, vaginal pain and pelvic pain.  Musculoskeletal: Positive for back pain.  Skin: Negative.   Neurological: Negative for tingling, weakness, numbness, headaches and paresthesias.    Allergies  Review of patient's allergies indicates no known allergies.  Home Medications   Prior to Admission medications   Medication Sig Start Date End Date Taking? Authorizing Provider  HYDROcodone-acetaminophen (NORCO/VICODIN) 5-325 MG per tablet Take 1 tablet by mouth every 4 (four) hours as needed. 03/31/13   Izola PriceFrances C. Sanford, PA-C   BP 111/71  Pulse 78  Temp(Src) 98.4 F (36.9 C) (Oral)  Resp 18  SpO2 100%  LMP 07/24/2013  Breastfeeding? No Physical Exam  Nursing note and vitals reviewed. Constitutional: She is oriented to person, place, and time. She appears well-developed and well-nourished. No distress.  HENT:  Head: Normocephalic and atraumatic.  Eyes: Conjunctivae are normal. No scleral icterus.  Neck: Normal range of motion. Neck supple.  Cardiovascular: Normal  rate, regular rhythm and normal heart sounds.   Pulmonary/Chest: Effort normal and breath sounds normal. No respiratory distress. She has no wheezes.  Abdominal: Soft. Bowel sounds are normal. She exhibits no distension. There is no tenderness. There is no CVA tenderness.  Musculoskeletal: Normal range of motion.       Back:  Area of generalized discomfort. No pain during palpation or ROM on today's exam  Neurological: She is alert and oriented to person, place, and time. She has  normal strength. No sensory deficit. Coordination and gait normal.  Reflex Scores:      Patellar reflexes are 2+ on the right side and 2+ on the left side. Skin: Skin is warm and dry. No rash noted. No erythema.  Psychiatric: She has a normal mood and affect. Her behavior is normal.    ED Course  Procedures (including critical care time) Labs Review Labs Reviewed  POCT URINALYSIS DIP (DEVICE) - Abnormal; Notable for the following:    Hgb urine dipstick MODERATE (*)    Leukocytes, UA SMALL (*)    All other components within normal limits  POCT PREGNANCY, URINE    Results for orders placed during the hospital encounter of 07/26/13  POCT URINALYSIS DIP (DEVICE)      Result Value Ref Range   Glucose, UA NEGATIVE  NEGATIVE mg/dL   Bilirubin Urine NEGATIVE  NEGATIVE   Ketones, ur NEGATIVE  NEGATIVE mg/dL   Specific Gravity, Urine >=1.030  1.005 - 1.030   Hgb urine dipstick MODERATE (*) NEGATIVE   pH 6.0  5.0 - 8.0   Protein, ur NEGATIVE  NEGATIVE mg/dL   Urobilinogen, UA 0.2  0.0 - 1.0 mg/dL   Nitrite NEGATIVE  NEGATIVE   Leukocytes, UA SMALL (*) NEGATIVE  POCT PREGNANCY, URINE      Result Value Ref Range   Preg Test, Ur NEGATIVE  NEGATIVE   Imaging Review No results found.   MDM   1. Lumbago    UA grossly normal and UPT negative. I suspect that this is some manifestation of menstrual cramping however patient does not have significant bleeding because of IUD. Will advise ibuprofen as directed with follow up if no improvement. Exam without worrisome deficit or focal physical finding.   Jess BartersJennifer Lee Minnesott BeachPresson, [place] 07/26/13 [phone]

## 2013-07-26 NOTE — Discharge Instructions (Signed)
Urine studies were normal and your pregnancy test was negative. Please use medication as prescribed. I suspect this is related to menstrual cramping without normal menstrual flow as a result of your IUD.   Back Pain, Adult Low back pain is very common. About 1 in 5 people have back pain.The cause of low back pain is rarely dangerous. The pain often gets better over time.About half of people with a sudden onset of back pain feel better in just 2 weeks. About 8 in 10 people feel better by 6 weeks.  CAUSES Some common causes of back pain include:  Strain of the muscles or ligaments supporting the spine.  Wear and tear (degeneration) of the spinal discs.  Arthritis.  Direct injury to the back. DIAGNOSIS Most of the time, the direct cause of low back pain is not known.However, back pain can be treated effectively even when the exact cause of the pain is unknown.Answering your caregiver's questions about your overall health and symptoms is one of the most accurate ways to make sure the cause of your pain is not dangerous. If your caregiver needs more information, he or she may order lab work or imaging tests (X-rays or MRIs).However, even if imaging tests show changes in your back, this usually does not require surgery. HOME CARE INSTRUCTIONS For many people, back pain returns.Since low back pain is rarely dangerous, it is often a condition that people can learn to [facility] their own.   Remain active. It is stressful on the back to sit or stand in one place. Do not sit, drive, or stand in one place for more than 30 minutes at a time. Take short walks on level surfaces as soon as pain allows.Try to increase the length of time you walk each day.  Do not stay in bed.Resting more than 1 or 2 days can delay your recovery.  Do not avoid exercise or work.Your body is made to move.It is not dangerous to be active, even though your back may hurt.Your back will likely heal faster if you return to  being active before your pain is gone.  Pay attention to your body when you bend and lift. Many people have less discomfortwhen lifting if they bend their knees, keep the load close to their bodies,and avoid twisting. Often, the most comfortable positions are those that put less stress on your recovering back.  Find a comfortable position to sleep. Use a firm mattress and lie on your side with your knees slightly bent. If you lie on your back, put a pillow under your knees.  Only take over-the-counter or prescription medicines as directed by your caregiver. Over-the-counter medicines to reduce pain and inflammation are often the most helpful.Your caregiver may prescribe muscle relaxant drugs.These medicines help dull your pain so you can more quickly return to your normal activities and healthy exercise.  Put ice on the injured area.  Put ice in a plastic bag.  Place a towel between your skin and the bag.  Leave the ice on for 15-20 minutes, 03-04 times a day for the first 2 to 3 days. After that, ice and heat may be alternated to reduce pain and spasms.  Ask your caregiver about trying back exercises and gentle massage. This may be of some benefit.  Avoid feeling anxious or stressed.Stress increases muscle tension and can worsen back pain.It is important to recognize when you are anxious or stressed and learn ways to manage it.Exercise is a great option. SEEK MEDICAL CARE IF:  You have pain that is not relieved with rest or medicine.  You have pain that does not improve in 1 week.  You have new symptoms.  You are generally not feeling well. SEEK IMMEDIATE MEDICAL CARE IF:   You have pain that radiates from your back into your legs.  You develop new bowel or bladder control problems.  You have unusual weakness or numbness in your arms or legs.  You develop nausea or vomiting.  You develop abdominal pain.  You feel faint. Document Released: 03/23/2005 Document Revised:  09/22/2011 Document Reviewed: 08/11/2010 [facility] Patient Information 2014 [place], [place].
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
## 2013-07-27 NOTE — ED Provider Notes (Signed)
Medical screening examination/treatment/procedure(s) were performed by a resident physician or non-physician practitioner and as the supervising physician I was immediately available for consultation/collaboration.  Shonda Mandarino, MD    Nayden Czajka S Saphronia Ozdemir, MD 07/27/13 0745 

## 2013-12-13 ENCOUNTER — Encounter (HOSPITAL_COMMUNITY): Payer: Self-pay | Admitting: Emergency Medicine

## 2013-12-13 ENCOUNTER — Emergency Department (HOSPITAL_COMMUNITY)
Admission: EM | Admit: 2013-12-13 | Discharge: 2013-12-13 | Discharge status: Against Medical Advice | Payer: Medicaid Other | Attending: Emergency Medicine | Admitting: Emergency Medicine

## 2013-12-13 DIAGNOSIS — M545 Low back pain, unspecified: Secondary | ICD-10-CM | POA: Diagnosis not present

## 2013-12-13 DIAGNOSIS — M79609 Pain in unspecified limb: Secondary | ICD-10-CM | POA: Insufficient documentation

## 2013-12-13 DIAGNOSIS — J45909 Unspecified asthma, uncomplicated: Secondary | ICD-10-CM | POA: Diagnosis not present

## 2013-12-13 NOTE — ED Notes (Signed)
Pt left AMA without signing. 

## 2013-12-13 NOTE — ED Notes (Signed)
Pt c/o lower back pain into left leg x 3 days; pt thinks could be problem with mirena birth control

## 2013-12-15 ENCOUNTER — Emergency Department (HOSPITAL_COMMUNITY): Payer: Medicaid Other

## 2013-12-15 ENCOUNTER — Encounter (HOSPITAL_COMMUNITY): Payer: Self-pay | Admitting: Emergency Medicine

## 2013-12-15 ENCOUNTER — Emergency Department (HOSPITAL_COMMUNITY)
Admission: EM | Admit: 2013-12-15 | Discharge: 2013-12-15 | Disposition: A | Discharge status: Home / Self Care | Payer: Medicaid Other | Attending: Emergency Medicine | Admitting: Emergency Medicine

## 2013-12-15 DIAGNOSIS — Z87891 Personal history of nicotine dependence: Secondary | ICD-10-CM | POA: Diagnosis not present

## 2013-12-15 DIAGNOSIS — R0602 Shortness of breath: Secondary | ICD-10-CM

## 2013-12-15 DIAGNOSIS — J45901 Unspecified asthma with (acute) exacerbation: Secondary | ICD-10-CM | POA: Diagnosis not present

## 2013-12-15 DIAGNOSIS — M549 Dorsalgia, unspecified: Secondary | ICD-10-CM | POA: Diagnosis present

## 2013-12-15 DIAGNOSIS — M545 Low back pain, unspecified: Secondary | ICD-10-CM | POA: Insufficient documentation

## 2013-12-15 DIAGNOSIS — Z791 Long term (current) use of non-steroidal anti-inflammatories (NSAID): Secondary | ICD-10-CM | POA: Diagnosis not present

## 2013-12-15 DIAGNOSIS — Z8619 Personal history of other infectious and parasitic diseases: Secondary | ICD-10-CM | POA: Insufficient documentation

## 2013-12-15 DIAGNOSIS — Z8659 Personal history of other mental and behavioral disorders: Secondary | ICD-10-CM | POA: Insufficient documentation

## 2013-12-15 LAB — CBC
HCT: 36.1 % (ref 36.0–46.0)
Hemoglobin: 12.5 g/dL (ref 12.0–15.0)
MCH: 29.9 pg (ref 26.0–34.0)
MCHC: 34.6 g/dL (ref 30.0–36.0)
MCV: 86.4 fL (ref 78.0–100.0)
PLATELETS: 225 10*3/uL (ref 150–400)
RBC: 4.18 MIL/uL (ref 3.87–5.11)
RDW: 13.5 % (ref 11.5–15.5)
WBC: 7.2 10*3/uL (ref 4.0–10.5)

## 2013-12-15 LAB — BASIC METABOLIC PANEL
ANION GAP: 10 (ref 5–15)
BUN: 10 mg/dL (ref 6–23)
CALCIUM: 8.4 mg/dL (ref 8.4–10.5)
CHLORIDE: 105 meq/L (ref 96–112)
CO2: 24 mEq/L (ref 19–32)
CREATININE: 0.73 mg/dL (ref 0.50–1.10)
GFR calc Af Amer: >90 mL/min (ref >90)
GFR calc non Af Amer: >90 mL/min (ref >90)
Glucose, Bld: 57 mg/dL — ABNORMAL LOW (ref 70–99)
Potassium: 4 mEq/L (ref 3.7–5.3)
Sodium: 139 mEq/L (ref 137–147)

## 2013-12-15 LAB — D-DIMER, QUANTITATIVE (NOT AT ARMC): D-Dimer, Quant: <0.27 ug/mL-FEU (ref 0.00–0.48)

## 2013-12-15 LAB — I-STAT TROPONIN, ED: Troponin i, poc: 0 ng/mL (ref 0.00–0.08)

## 2013-12-15 LAB — CBG MONITORING, ED: Glucose-Capillary: 98 mg/dL (ref 70–99)

## 2013-12-15 MED ORDER — NAPROXEN 500 MG PO TABS: 500.0000 mg | ORAL_TABLET | Freq: Two times a day (BID) | ORAL | Status: DC

## 2013-12-15 MED ORDER — METHOCARBAMOL 500 MG PO TABS: 500.0000 mg | ORAL_TABLET | Freq: Two times a day (BID) | ORAL | Status: DC | PRN

## 2013-12-15 MED ORDER — METHOCARBAMOL 500 MG PO TABS
500.0000 mg | ORAL_TABLET | Freq: Once | ORAL | Status: AC
  Administered 2013-12-15: 500 mg via ORAL
  Filled 2013-12-15: qty 1

## 2013-12-15 MED ORDER — NAPROXEN 250 MG PO TABS
500.0000 mg | ORAL_TABLET | Freq: Once | ORAL | Status: AC
  Administered 2013-12-15: 500 mg via ORAL
  Filled 2013-12-15: qty 2

## 2013-12-15 MED ORDER — METHOCARBAMOL 1000 MG/10ML IJ SOLN: 1000.0000 mg | Freq: Once | INTRAMUSCULAR | Status: DC

## 2013-12-15 NOTE — ED Provider Notes (Signed)
Medical screening examination/treatment/procedure(s) were performed by non-physician practitioner and as supervising physician I was immediately available for consultation/collaboration.   EKG Interpretation None        Richardean Canal, MD 12/15/13 2108

## 2013-12-15 NOTE — ED Provider Notes (Signed)
CSN: [id number]     Arrival date & time 12/15/13  1041 History   First MD Initiated Contact with Patient 12/15/13 1107     Chief Complaint  Patient presents with  . Chest Pain  . Back Pain     (Consider location/radiation/quality/duration/timing/severity/associated sxs/prior Treatment) HPI Claudia Gonzales is a 23 y.o. female who comes in complaining of back pain, IUD falling out, shortness of breath. She reports her back pain began 3 days ago. She was sitting still and just felt a sharp pain in her lower back. She said the pain felt like contractions and were intermittent in nature. She tried 800 mg ibuprofen and 1000 mg Tylenol with no relief. No history of cancer, injury, steroid use, IV drug abuse, no motor weaknesses, no fevers. She also reports last night while in the shower her IUD fell out. She brought it with her today and it appears to be intact. She also reports this morning at 8:00 she was driving to the drive-through getting breakfast and felt a sudden tightness and squeezing in her chest. This discomfort did not radiate anywhere. She thought it was better when she breathed slower. Denies any previous activity, no nausea vomiting, diaphoresis. Denies any recent travel, surgeries, unilateral leg swelling, pain with inspiration. She is a current smoker.  Past Medical History  Diagnosis Date  . Asthma   . Preterm labor   . Anxiety   . Chlamydia    Past Surgical History  Procedure Laterality Date  . Appendectomy    . Wisdom tooth extraction     Family History  Problem Relation Age of Onset  . Hypertension Mother   . Cancer Father     colon  . Cancer Maternal Grandmother   . Cancer Maternal Grandfather   . Cancer Paternal Grandmother   . Cancer Paternal Grandfather    History  Substance Use Topics  . Smoking status: Former Smoker -- 0.50 packs/day for 1 years    Types: Cigarettes    Quit date: 06/17/2012  . Smokeless tobacco: Never Used  . Alcohol Use: 4.2 oz/week     7 Glasses of wine per week     Comment: glass of wine /day   OB History   Grav Para Term Preterm Abortions TAB SAB Ect Mult Living   Review of Systems  Constitutional: Negative for fever.  Respiratory: Positive for shortness of breath.   Cardiovascular: Negative for chest pain.  Musculoskeletal: Positive for back pain.  Skin: Negative for rash.      Allergies  Review of patient's allergies indicates no known allergies.  Home Medications   Prior to Admission medications   Medication Sig Start Date End Date Taking? Authorizing Provider  ibuprofen (ADVIL,MOTRIN) 400 MG tablet Take 1 tablet (400 mg total) by mouth every 6 (six) hours as needed for mild pain or cramping. 07/26/13  Yes Ria Clock, PA  methocarbamol (ROBAXIN) 500 MG tablet Take 1 tablet (500 mg total) by mouth 2 (two) times daily as needed for muscle spasms. 12/15/13   Earle Gell Saamiya Jeppsen, PA-C  naproxen (NAPROSYN) 500 MG tablet Take 1 tablet (500 mg total) by mouth 2 (two) times daily. 12/15/13   Earle Gell Jaleyah Longhi, PA-C   BP 107/71  Pulse 79  Temp(Src) 99.3 F (37.4 C) (Oral)  Resp 15  SpO2 100% Physical Exam  Nursing note and vitals reviewed. Constitutional:  Awake, alert, nontoxic appearance with  baseline speech.  HENT:  Head: Atraumatic.  Eyes: Pupils are equal, round, and reactive to light. Right eye exhibits no discharge. Left eye exhibits no discharge.  Neck: Neck supple.  Cardiovascular: Normal rate and regular rhythm.   No murmur heard. Pulmonary/Chest: Effort normal and breath sounds normal. No respiratory distress. She has no wheezes. She has no rales. She exhibits no tenderness.  Abdominal: Soft. Bowel sounds are normal. She exhibits no mass. There is no tenderness. There is no rebound.  Musculoskeletal:       Thoracic back: She exhibits no tenderness.       Lumbar back: She exhibits no tenderness.  Bilateral lower extremities non tender without new rashes or  color change, baseline ROM with intact DP / PT pulses, CR<2 secs all digits bilaterally, sensation baseline light touch bilaterally for pt, motor symmetric bilateral 5 / 5 hip flexion, quadriceps, hamstrings, EHL, foot dorsiflexion, foot plantarflexion, gait without apparent new ataxia. Patient walked to bathroom from bedroom without any discomfort or ataxia.  Neurological:  Mental status baseline for patient.  Upper extremity motor strength and sensation intact and symmetric bilaterally.  Skin: No rash noted.  Psychiatric: She has a normal mood and affect.    ED Course  Procedures (including critical care time) Labs Review Labs Reviewed  BASIC METABOLIC PANEL - Abnormal; Notable for the following:    Glucose, Bld 57 (*)    All other components within normal limits  CBC  D-DIMER, QUANTITATIVE  PREGNANCY, URINE  I-STAT TROPOININ, ED  CBG MONITORING, ED    Imaging Review Dg Chest 2 View  12/15/2013   CLINICAL DATA:  Chest pain and back pain  EXAM: CHEST  2 VIEW  COMPARISON:  None.  FINDINGS: The heart size and mediastinal contours are within normal limits. Both lungs are clear. The visualized skeletal structures are unremarkable.  IMPRESSION: No active cardiopulmonary disease.   Electronically Signed   By: Britta Mccreedy M.D.   On: 12/15/2013 13:38     EKG Interpretation None     Meds given in ED:  Medications  naproxen (NAPROSYN) tablet 500 mg (500 mg Oral Given 12/15/13 1247)  methocarbamol (ROBAXIN) tablet 500 mg (500 mg Oral Given 12/15/13 1247)    New Prescriptions   METHOCARBAMOL (ROBAXIN) 500 MG TABLET    Take 1 tablet (500 mg total) by mouth 2 (two) times daily as needed for muscle spasms.   NAPROXEN (NAPROSYN) 500 MG TABLET    Take 1 tablet (500 mg total) by mouth 2 (two) times daily.    Filed Vitals:   12/15/13 1215 12/15/13 1230 12/15/13 1245 12/15/13 1330  BP: 111/70 109/63 115/88 107/71  Pulse: 67 68 59 79  Temp:      TempSrc:      Resp: SpO2: 98%  100% 100% 100%    MDM   Vitals stable - WNL -afebrile. Shortness of breath has resolved. Pt resting comfortably in ED. Discomfort managed in ED with Naproxen and Robaxin for back discomfort. Cannot rule out pulmonary embolism the PERC criteria. Decision made to obtain d-dimer. D Dimer negative. Troponin negative. PE and clinical picture not concerning for emergent pathology at this time. No concern for PE, ACS, dissection, emergent spinal pathology-no red flags. Discussed referral and followup with gynecology for replacement of IUD or beginning of OCPs Will DC with naproxen and Robaxin for back discomfort. Discussed f/u with PCP and return precautions, pt very amenable to plan.  Final diagnoses:  Low back  pain without sciatica, unspecified back pain laterality  SOB (shortness of breath)   Prior to patient discharge, I discussed and reviewed this case with Dr.Yao        Sharlene Motts, PA-C 12/15/13 1416

## 2013-12-15 NOTE — ED Notes (Signed)
Cartner, PA at bedside 

## 2013-12-15 NOTE — ED Notes (Signed)
She states she was taking a shower this morning and her IUD fell out and her back started hurting so she decided to come to ED for eval. On the way here she began to have CP and "it felt like I could not breathe." she is alert and breathing easily now

## 2014-02-05 ENCOUNTER — Encounter (HOSPITAL_COMMUNITY): Payer: Self-pay | Admitting: Emergency Medicine

## 2014-07-12 ENCOUNTER — Encounter (HOSPITAL_COMMUNITY): Payer: Self-pay | Admitting: Emergency Medicine

## 2014-07-12 ENCOUNTER — Emergency Department (HOSPITAL_COMMUNITY)
Admission: EM | Admit: 2014-07-12 | Discharge: 2014-07-12 | Disposition: A | Discharge status: Home / Self Care | Payer: Medicaid Other | Attending: Emergency Medicine | Admitting: Emergency Medicine

## 2014-07-12 DIAGNOSIS — Z87891 Personal history of nicotine dependence: Secondary | ICD-10-CM | POA: Insufficient documentation

## 2014-07-12 DIAGNOSIS — Z8659 Personal history of other mental and behavioral disorders: Secondary | ICD-10-CM | POA: Diagnosis not present

## 2014-07-12 DIAGNOSIS — J020 Streptococcal pharyngitis: Secondary | ICD-10-CM

## 2014-07-12 DIAGNOSIS — J029 Acute pharyngitis, unspecified: Secondary | ICD-10-CM | POA: Diagnosis present

## 2014-07-12 DIAGNOSIS — Z8619 Personal history of other infectious and parasitic diseases: Secondary | ICD-10-CM | POA: Insufficient documentation

## 2014-07-12 DIAGNOSIS — Z791 Long term (current) use of non-steroidal anti-inflammatories (NSAID): Secondary | ICD-10-CM | POA: Diagnosis not present

## 2014-07-12 DIAGNOSIS — Z79899 Other long term (current) drug therapy: Secondary | ICD-10-CM | POA: Diagnosis not present

## 2014-07-12 DIAGNOSIS — J45909 Unspecified asthma, uncomplicated: Secondary | ICD-10-CM | POA: Diagnosis not present

## 2014-07-12 DIAGNOSIS — H9201 Otalgia, right ear: Secondary | ICD-10-CM | POA: Insufficient documentation

## 2014-07-12 DIAGNOSIS — Z8751 Personal history of pre-term labor: Secondary | ICD-10-CM | POA: Insufficient documentation

## 2014-07-12 LAB — RAPID STREP SCREEN (MED CTR MEBANE ONLY): Streptococcus, Group A Screen (Direct): POSITIVE — AB

## 2014-07-12 MED ORDER — IBUPROFEN 100 MG/5ML PO SUSP: 400.0000 mg | Freq: Four times a day (QID) | ORAL | Status: DC | PRN

## 2014-07-12 MED ORDER — PENICILLIN G BENZATHINE 1200000 UNIT/2ML IM SUSP
1.2000 10*6.[IU] | Freq: Once | INTRAMUSCULAR | Status: AC
  Administered 2014-07-12: 1.2 10*6.[IU] via INTRAMUSCULAR
  Filled 2014-07-12: qty 2

## 2014-07-12 MED ORDER — IBUPROFEN 400 MG PO TABS
600.0000 mg | ORAL_TABLET | Freq: Once | ORAL | Status: AC
  Administered 2014-07-12: 600 mg via ORAL
  Filled 2014-07-12: qty 2

## 2014-07-12 NOTE — ED Provider Notes (Signed)
CSN: [id number]     Arrival date & time 07/12/14  1424 History  This chart was scribed for non-physician practitioner, Junius FinnerErin O'Malley, PA-C, working with Zadie Rhineonald Wickline, MD by Charline BillsEssence Howell, ED Scribe. This patient was seen in room TR11C/TR11C and the patient's care was started at 3:07 PM.   Chief Complaint  Patient presents with  . Sore Throat   The history is provided by the patient. No language interpreter was used.   HPI Comments: Claudia Gonzales is a 24 y.o. female, with a h/o asthma, anxiety, who presents to the Emergency Department complaining of sudden onset of sore throat since this morning. Pt currently describes sore throat as 6/10, exacerbated with swallowing. She reports associated R ear pain since this morning. Pt denies cough, congestion, fever, nausea, vomiting, difficulty breathing, trouble swallowing. No medications tried PTA. No other alleviating or aggravating factors. No known allergies.   Past Medical History  Diagnosis Date  . Asthma   . Preterm labor   . Anxiety   . Chlamydia    Past Surgical History  Procedure Laterality Date  . Appendectomy    . Wisdom tooth extraction     Family History  Problem Relation Age of Onset  . Hypertension Mother   . Cancer Father     colon  . Cancer Maternal Grandmother   . Cancer Maternal Grandfather   . Cancer Paternal Grandmother   . Cancer Paternal Grandfather    History  Substance Use Topics  . Smoking status: Former Smoker -- 0.50 packs/day for 1 years    Types: Cigarettes    Quit date: 06/17/2012  . Smokeless tobacco: Never Used  . Alcohol Use: 4.2 oz/week    7 Glasses of wine per week     Comment: glass of wine /day   OB History    Gravida Para Term Preterm AB TAB SAB Ectopic Multiple Living   2 2 1 1      2      Review of Systems  Constitutional: Negative for fever.  HENT: Positive for ear pain and sore throat. Negative for congestion and trouble swallowing.   Respiratory: Negative for cough and  shortness of breath.   Gastrointestinal: Negative for nausea and vomiting.  Neurological: Negative for numbness.   Allergies  Review of patient's allergies indicates no known allergies.  Home Medications   Prior to Admission medications   Medication Sig Start Date End Date Taking? Authorizing Provider  ibuprofen (CHILD IBUPROFEN) 100 MG/5ML suspension Take 20 mLs (400 mg total) by mouth every 6 (six) hours as needed for fever, mild pain or moderate pain. 07/12/14   Junius FinnerErin O'Malley, PA-C  methocarbamol (ROBAXIN) 500 MG tablet Take 1 tablet (500 mg total) by mouth 2 (two) times daily as needed for muscle spasms. 12/15/13   Joycie PeekBenjamin Cartner, PA-C  naproxen (NAPROSYN) 500 MG tablet Take 1 tablet (500 mg total) by mouth 2 (two) times daily. 12/15/13   Joycie PeekBenjamin Cartner, PA-C   BP 113/70 mmHg  Pulse 88  Temp(Src) 98.1 F (36.7 C) (Oral)  Resp 22  Ht 5\' 6"  (1.676 m)  Wt 214 lb (97.07 kg)  BMI 34.56 kg/m2  SpO2 97% Physical Exam  Constitutional: She is oriented to person, place, and time. She appears well-developed and well-nourished.  HENT:  Head: Normocephalic and atraumatic.  Mouth/Throat: Uvula is midline. Oropharyngeal exudate and posterior oropharyngeal edema present.  R tonsil slightly larger than L without deviation of uvula. No soft palate edema.   Eyes: EOM are normal.  Neck: Normal range of motion.  Cardiovascular: Normal rate.   Pulmonary/Chest: Effort normal.  Musculoskeletal: Normal range of motion.  Neurological: She is alert and oriented to person, place, and time.  Skin: Skin is warm and dry.  Psychiatric: She has a normal mood and affect. Her behavior is normal.  Nursing note and vitals reviewed.  ED Course  Procedures (including critical care time) DIAGNOSTIC STUDIES: Oxygen Saturation is 97% on RA, normal by my interpretation.    COORDINATION OF CARE: 3:10 PM-Discussed treatment plan which includes strep screen and 600 mg ibuprofen with pt at bedside and pt agreed  to plan.   Labs Review Labs Reviewed  RAPID STREP SCREEN - Abnormal; Notable for the following:    Streptococcus, Group A Screen (Direct) POSITIVE (*)    All other components within normal limits   Imaging Review No results found.   EKG Interpretation None      MDM   Final diagnoses:  Strep pharyngitis    Pt presenting to ED with c/o sudden onset sore throat. No peritonsillar abscess on exam. Rapid strep: positive. Pt requested PCN IM instead of 10 days of PO tabs.  PCN G given in ED w/o immediate complication. Home care instructions provided. Advised to f/u in 3-4 days if symptoms not improving. Return precautions provided. Pt verbalized understanding and agreement with tx plan.   I personally performed the services described in this documentation, which was scribed in my presence. The recorded information has been reviewed and is accurate.    Junius Finner, PA-C 07/12/14 1538  Zadie Rhine, MD 07/12/14 [phone]

## 2014-07-12 NOTE — ED Notes (Signed)
Pt presents with a sore throat with white patches to back of throat onset today- airway intact.  Pt also admits to spraining right wrist 3 weeks ago and continuing to having pain- pt states she lost her wrist brace.  Pt would also like to be seen for lower back pain X 2 days after lifting something heavy- denies numbness/tingling.  Ambulatory without difficulty.

## 2014-12-07 ENCOUNTER — Emergency Department (HOSPITAL_COMMUNITY)
Admission: EM | Admit: 2014-12-07 | Discharge: 2014-12-08 | Disposition: A | Discharge status: Home / Self Care | Payer: Medicaid Other | Attending: Emergency Medicine | Admitting: Emergency Medicine

## 2014-12-07 ENCOUNTER — Encounter (HOSPITAL_COMMUNITY): Payer: Self-pay | Admitting: *Deleted

## 2014-12-07 DIAGNOSIS — Z8659 Personal history of other mental and behavioral disorders: Secondary | ICD-10-CM | POA: Diagnosis not present

## 2014-12-07 DIAGNOSIS — M79672 Pain in left foot: Secondary | ICD-10-CM | POA: Diagnosis present

## 2014-12-07 DIAGNOSIS — Z87891 Personal history of nicotine dependence: Secondary | ICD-10-CM | POA: Diagnosis not present

## 2014-12-07 DIAGNOSIS — Z791 Long term (current) use of non-steroidal anti-inflammatories (NSAID): Secondary | ICD-10-CM | POA: Insufficient documentation

## 2014-12-07 DIAGNOSIS — Z8619 Personal history of other infectious and parasitic diseases: Secondary | ICD-10-CM | POA: Diagnosis not present

## 2014-12-07 DIAGNOSIS — J45909 Unspecified asthma, uncomplicated: Secondary | ICD-10-CM | POA: Insufficient documentation

## 2014-12-07 NOTE — ED Notes (Signed)
Pt c/o lt foot pain across the top for one month.  No known injury.  lmp none injection

## 2014-12-08 ENCOUNTER — Emergency Department (HOSPITAL_COMMUNITY): Payer: Medicaid Other

## 2014-12-08 MED ORDER — NAPROXEN 250 MG PO TABS
500.0000 mg | ORAL_TABLET | Freq: Once | ORAL | Status: AC
  Administered 2014-12-08: 500 mg via ORAL
  Filled 2014-12-08: qty 2

## 2014-12-08 MED ORDER — NAPROXEN 500 MG PO TABS: 500.0000 mg | ORAL_TABLET | Freq: Two times a day (BID) | ORAL | Status: DC

## 2014-12-08 NOTE — ED Notes (Signed)
Pt stable, ambulatory, states understanding of discharge instructions 

## 2014-12-08 NOTE — Discharge Instructions (Signed)
Please read and follow all provided instructions.  Your diagnoses today include:  1. Left foot pain     Tests performed today include:  An x-ray of the affected area - does NOT show any broken bones  Vital signs. See below for your results today.   Medications prescribed:   Naproxen - anti-inflammatory pain medication  Do not exceed  naproxen every 12 hours, take with food  You have been prescribed an anti-inflammatory medication or NSAID. Take with food. Take smallest effective dose for the shortest duration needed for your pain. Stop taking if you experience stomach pain or vomiting.   Take any prescribed medications only as directed.  Home care instructions:   Follow any educational materials contained in this packet  Follow R.I.C.E. Protocol:  R - rest your injury   I  - use ice on injury without applying directly to skin  C - compress injury with bandage or splint  E - elevate the injury as much as possible  Follow-up instructions: Please follow-up with your primary care provider or the provided orthopedic physician (bone specialist) if you continue to have significant pain in 1 week. In this case you may have a more severe injury that requires further care.   Return instructions:   Please return if your toes or feet are numb or tingling, appear gray or blue, or you have severe pain (also elevate the leg and loosen splint or wrap if you were given one)  Please return to the Emergency Department if you experience worsening symptoms.   Please return if you have any other emergent concerns.  Additional Information:  Your vital signs today were: BP 122/70 mmHg   Pulse 79   Temp(Src) 98.4 F (36.9 C) (Oral)   Resp 16   Wt 212 lb 6.4 oz (96.344 kg)   SpO2 98% If your blood pressure (BP) was elevated above 135/85 this visit, please have this repeated by your doctor within one month. --------------

## 2014-12-08 NOTE — ED Provider Notes (Signed)
CSN: [id number]     Arrival date & time 12/07/14  2336 History   First MD Initiated Contact with Patient 12/07/14 2348     Chief Complaint  Patient presents with  . Foot Pain     (Consider location/radiation/quality/duration/timing/severity/associated sxs/prior Treatment) HPI Comments: Patient presents with complaint of left foot pain worsening acutely today after she stepped on a rock and twisted her foot. Patient states that she has been walking between 5 and 6 miles every day. She states that she has had pain in the foot for approximately one month in the same area. No numbness or tingling. No knee pain or hip pain. No other injuries reported. Patient has not taken any medications for the pain in the past month and denies applying heat or ice.  Patient is a 24 y.o. female presenting with lower extremity pain. The history is provided by the patient.  Foot Pain Associated symptoms include arthralgias. Pertinent negatives include no joint swelling, neck pain, numbness or weakness.    Past Medical History  Diagnosis Date  . Asthma   . Preterm labor   . Anxiety   . Chlamydia    Past Surgical History  Procedure Laterality Date  . Appendectomy    . Wisdom tooth extraction     Family History  Problem Relation Age of Onset  . Hypertension Mother   . Cancer Father     colon  . Cancer Maternal Grandmother   . Cancer Maternal Grandfather   . Cancer Paternal Grandmother   . Cancer Paternal Grandfather    Social History  Substance Use Topics  . Smoking status: Former Smoker -- 0.50 packs/day for 1 years    Types: Cigarettes    Quit date: 06/17/2012  . Smokeless tobacco: Never Used  . Alcohol Use: 4.2 oz/week    7 Glasses of wine per week     Comment: glass of wine /day   OB History    Gravida Para Term Preterm AB TAB SAB Ectopic Multiple Living   2 2 1 1      2      Review of Systems  Constitutional: Negative for activity change.  Musculoskeletal: Positive for arthralgias.  Negative for back pain, joint swelling and neck pain.  Skin: Negative for wound.  Neurological: Negative for weakness and numbness.      Allergies  Review of patient's allergies indicates no known allergies.  Home Medications   Prior to Admission medications   Medication Sig Start Date End Date Taking? Authorizing Provider  ibuprofen (CHILD IBUPROFEN) 100 MG/5ML suspension Take 20 mLs (400 mg total) by mouth every 6 (six) hours as needed for fever, mild pain or moderate pain. 07/12/14   Junius Finner, PA-C  methocarbamol (ROBAXIN) 500 MG tablet Take 1 tablet (500 mg total) by mouth 2 (two) times daily as needed for muscle spasms. 12/15/13   Joycie Peek, PA-C  naproxen (NAPROSYN) 500 MG tablet Take 1 tablet (500 mg total) by mouth 2 (two) times daily. 12/15/13   Benjamin Cartner, PA-C   BP 122/70 mmHg  Pulse 79  Temp(Src) 98.4 F (36.9 C) (Oral)  Resp 16  Wt 212 lb 6.4 oz (96.344 kg)  SpO2 98% Physical Exam  Constitutional: She appears well-developed and well-nourished.  HENT:  Head: Normocephalic and atraumatic.  Eyes: Pupils are equal, round, and reactive to light.  Neck: Normal range of motion. Neck supple.  Cardiovascular: Exam reveals no decreased pulses.   Musculoskeletal: She exhibits tenderness. She exhibits no edema.  Left hip: Normal.       Left knee: Normal.       Left ankle: Tenderness. No proximal fibula tenderness found. Achilles tendon normal.       Left lower leg: Normal.       Left foot: There is tenderness and bony tenderness. There is normal range of motion and no swelling.       Feet:  Neurological: She is alert. No sensory deficit.  Motor, sensation, and vascular distal to the injury is fully intact.   Skin: Skin is warm and dry.  Psychiatric: She has a normal mood and affect.  Nursing note and vitals reviewed.   ED Course  Procedures (including critical care time) Labs Review Labs Reviewed - No data to display  Imaging Review Dg Foot  Complete Left  12/08/2014   CLINICAL DATA:  Chronic left foot pain for 1-1/2 months, worsened after stepping on rock. Initial encounter.  EXAM: LEFT FOOT - COMPLETE 3+ VIEW  COMPARISON:  None.  FINDINGS: There is no evidence of fracture or dislocation. The joint spaces are preserved. There is no evidence of talar subluxation; the subtalar joint is unremarkable in appearance.  No significant soft tissue abnormalities are seen.  IMPRESSION: No evidence of fracture or dislocation.   Electronically Signed   By: Roanna Raider M.D.   On: 12/08/2014 00:54   I have personally reviewed and evaluated these images and lab results as part of my medical decision-making.   EKG Interpretation None      12:01 AM Patient seen and examined. Work-up initiated. Medications ordered.   Vital signs reviewed and are as follows: BP 122/70 mmHg  Pulse 79  Temp(Src) 98.4 F (36.9 C) (Oral)  Resp 16  Wt 212 lb 6.4 oz (96.344 kg)  SpO2 98%  1:04 AM patient informed of x-ray results. Provided with postop shoe. Discussed use of NSAIDs for home and rice protocol. Patient encouraged to follow-up with orthopedic referral if not improved in one week.    MDM   Final diagnoses:  Left foot pain   Patient with foot pain, x-rays negative. Likely contusion, arthritis, or sprain of foot. Patient is ambulatory. Lower extremity is neurovascularly intact.    Renne Crigler, PA-C 12/08/14 0105  Jerelyn Scott, MD 12/08/14 [phone]

## 2014-12-13 ENCOUNTER — Encounter (HOSPITAL_COMMUNITY): Payer: Self-pay | Admitting: *Deleted

## 2014-12-13 DIAGNOSIS — Z9049 Acquired absence of other specified parts of digestive tract: Secondary | ICD-10-CM | POA: Diagnosis not present

## 2014-12-13 DIAGNOSIS — Z8619 Personal history of other infectious and parasitic diseases: Secondary | ICD-10-CM | POA: Diagnosis not present

## 2014-12-13 DIAGNOSIS — Z87891 Personal history of nicotine dependence: Secondary | ICD-10-CM | POA: Diagnosis not present

## 2014-12-13 DIAGNOSIS — Z8659 Personal history of other mental and behavioral disorders: Secondary | ICD-10-CM | POA: Insufficient documentation

## 2014-12-13 DIAGNOSIS — J45909 Unspecified asthma, uncomplicated: Secondary | ICD-10-CM | POA: Diagnosis not present

## 2014-12-13 DIAGNOSIS — R110 Nausea: Secondary | ICD-10-CM | POA: Insufficient documentation

## 2014-12-13 DIAGNOSIS — R109 Unspecified abdominal pain: Secondary | ICD-10-CM | POA: Insufficient documentation

## 2014-12-13 DIAGNOSIS — Z791 Long term (current) use of non-steroidal anti-inflammatories (NSAID): Secondary | ICD-10-CM | POA: Diagnosis not present

## 2014-12-13 DIAGNOSIS — N898 Other specified noninflammatory disorders of vagina: Secondary | ICD-10-CM | POA: Insufficient documentation

## 2014-12-13 DIAGNOSIS — Z3202 Encounter for pregnancy test, result negative: Secondary | ICD-10-CM | POA: Diagnosis not present

## 2014-12-13 LAB — COMPREHENSIVE METABOLIC PANEL
ALBUMIN: 3.1 g/dL — AB (ref 3.5–5.0)
ALK PHOS: 50 U/L (ref 38–126)
ALT: 16 U/L (ref 14–54)
AST: 16 U/L (ref 15–41)
Anion gap: 7 (ref 5–15)
BILIRUBIN TOTAL: 0.4 mg/dL (ref 0.3–1.2)
BUN: 11 mg/dL (ref 6–20)
CALCIUM: 8.6 mg/dL — AB (ref 8.9–10.3)
CO2: 23 mmol/L (ref 22–32)
CREATININE: 0.92 mg/dL (ref 0.44–1.00)
Chloride: 106 mmol/L (ref 101–111)
GFR calc Af Amer: >60 mL/min (ref >60)
GFR calc non Af Amer: >60 mL/min (ref >60)
GLUCOSE: 93 mg/dL (ref 65–99)
Potassium: 3.7 mmol/L (ref 3.5–5.1)
Sodium: 136 mmol/L (ref 135–145)
TOTAL PROTEIN: 5.8 g/dL — AB (ref 6.5–8.1)

## 2014-12-13 LAB — URINALYSIS, ROUTINE W REFLEX MICROSCOPIC
Glucose, UA: NEGATIVE mg/dL
HGB URINE DIPSTICK: NEGATIVE
Ketones, ur: 15 mg/dL — AB
Leukocytes, UA: NEGATIVE
Nitrite: NEGATIVE
Protein, ur: NEGATIVE mg/dL
SPECIFIC GRAVITY, URINE: 1.036 — AB (ref 1.005–1.030)
UROBILINOGEN UA: 1 mg/dL (ref 0.0–1.0)
pH: 5.5 (ref 5.0–8.0)

## 2014-12-13 LAB — CBC
HEMATOCRIT: 33 % — AB (ref 36.0–46.0)
Hemoglobin: 10.8 g/dL — ABNORMAL LOW (ref 12.0–15.0)
MCH: 26.6 pg (ref 26.0–34.0)
MCHC: 32.7 g/dL (ref 30.0–36.0)
MCV: 81.3 fL (ref 78.0–100.0)
PLATELETS: 231 10*3/uL (ref 150–400)
RBC: 4.06 MIL/uL (ref 3.87–5.11)
RDW: 15.9 % — AB (ref 11.5–15.5)
WBC: 7.9 10*3/uL (ref 4.0–10.5)

## 2014-12-13 LAB — LIPASE, BLOOD: Lipase: 34 U/L (ref 22–51)

## 2014-12-13 LAB — POC URINE PREG, ED: PREG TEST UR: NEGATIVE

## 2014-12-13 NOTE — ED Notes (Signed)
Pt c/o abd pain x 2 days 

## 2014-12-14 ENCOUNTER — Emergency Department (HOSPITAL_COMMUNITY)
Admission: EM | Admit: 2014-12-14 | Discharge: 2014-12-14 | Disposition: A | Discharge status: Home / Self Care | Payer: Medicaid Other | Attending: Emergency Medicine | Admitting: Emergency Medicine

## 2014-12-14 DIAGNOSIS — R109 Unspecified abdominal pain: Secondary | ICD-10-CM

## 2014-12-14 LAB — GC/CHLAMYDIA PROBE AMP (~~LOC~~) NOT AT ARMC
CHLAMYDIA, DNA PROBE: NEGATIVE
Neisseria Gonorrhea: NEGATIVE

## 2014-12-14 LAB — WET PREP, GENITAL
CLUE CELLS WET PREP: NONE SEEN
Trich, Wet Prep: NONE SEEN
WBC, Wet Prep HPF POC: NONE SEEN
Yeast Wet Prep HPF POC: NONE SEEN

## 2014-12-14 LAB — HIV ANTIBODY (ROUTINE TESTING W REFLEX): HIV SCREEN 4TH GENERATION: NONREACTIVE

## 2014-12-14 NOTE — Discharge Instructions (Signed)
Abdominal Pain Claudia Gonzales, see a primary care physician within 3 days for close follow up.  Use tylenol or ibuprofen for pain control.  If symptoms worsen, come back to the ED immediately.  Thank you. Many things can cause belly (abdominal) pain. Most times, the belly pain is not dangerous. Many cases of belly pain can be watched and treated at home. HOME CARE   Do not take medicines that help you go poop (laxatives) unless told to by your doctor.  Only take medicine as told by your doctor.  Eat or drink as told by your doctor. Your doctor will tell you if you should be on a special diet. GET HELP IF:  You do not know what is causing your belly pain.  You have belly pain while you are sick to your stomach (nauseous) or have runny poop (diarrhea).  You have pain while you pee or poop.  Your belly pain wakes you up at night.  You have belly pain that gets worse or better when you eat.  You have belly pain that gets worse when you eat fatty foods.  You have a fever. GET HELP RIGHT AWAY IF:   The pain does not go away within 2 hours.  You keep throwing up (vomiting).  The pain changes and is only in the right or left part of the belly.  You have bloody or tarry looking poop. MAKE SURE YOU:   Understand these instructions.  Will watch your condition.  Will get help right away if you are not doing well or get worse. Document Released: 09/09/2007 Document Revised: 03/28/2013 Document Reviewed: 11/30/2012 [facility] Patient Information 2015 [place], [place]. This information is not intended to replace advice given to you by your health care provider. Make sure you discuss any questions you have with your health care provider.

## 2014-12-14 NOTE — ED Provider Notes (Signed)
CSN: [id number]     Arrival date & time 12/13/14  2221 History  This chart was scribed for No att. providers found by Evon Slack, ED Scribe. This patient was seen in room A11C/A11C and the patient's care was started at 2:32 AM.     Chief Complaint  Patient presents with  . Abdominal Pain   Patient is a 24 y.o. female presenting with abdominal pain. The history is provided by the patient. No language interpreter was used.  Abdominal Pain Pain quality: cramping   Pain severity:  Moderate Onset quality:  Gradual Duration:  3 days Chronicity:  New Relieved by:  None tried Worsened by:  Nothing tried Ineffective treatments:  None tried Associated symptoms: nausea and vaginal discharge   Associated symptoms: no diarrhea, no dysuria, no fever, no hematuria and no vaginal bleeding    HPI Comments: Claudia Gonzales is a 24 y.o. female who presents to the Emergency Department complaining of cramping abdominal pain onset 3 days prior. Pt reports associated nausea and clear vaginal discharge. Pt states that she has recently stop receiving Depo injection. Denies diarrhea, fever, vaginal bleeding, dysuria, or hematuria   Past Medical History  Diagnosis Date  . Asthma   . Preterm labor   . Anxiety   . Chlamydia    Past Surgical History  Procedure Laterality Date  . Appendectomy    . Wisdom tooth extraction     Family History  Problem Relation Age of Onset  . Hypertension Mother   . Cancer Father     colon  . Cancer Maternal Grandmother   . Cancer Maternal Grandfather   . Cancer Paternal Grandmother   . Cancer Paternal Grandfather    Social History  Substance Use Topics  . Smoking status: Former Smoker -- 0.50 packs/day for 1 years    Types: Cigarettes    Quit date: 06/17/2012  . Smokeless tobacco: Never Used  . Alcohol Use: 4.2 oz/week    7 Glasses of wine per week     Comment: glass of wine /day   OB History    Gravida Para Term Preterm AB TAB SAB Ectopic Multiple  Living   Review of Systems  Constitutional: Negative for fever.  Gastrointestinal: Positive for nausea and abdominal pain. Negative for diarrhea.  Genitourinary: Positive for vaginal discharge. Negative for dysuria, hematuria and vaginal bleeding.  All other systems reviewed and are negative.     Allergies  Review of patient's allergies indicates no known allergies.  Home Medications   Prior to Admission medications   Medication Sig Start Date End Date Taking? Authorizing Provider  naproxen (NAPROSYN) 500 MG tablet Take 1 tablet (500 mg total) by mouth 2 (two) times daily. 12/08/14   Renne Crigler, PA-C   BP 100/54 mmHg  Pulse 87  Temp(Src) 98 F (36.7 C) (Oral)  Resp 18  Ht  (1.676 m)  Wt 213 lb (96.616 kg)  BMI 34.40 kg/m2  SpO2 99%   Physical Exam  Constitutional: She is oriented to person, place, and time. She appears well-developed and well-nourished. No distress.  HENT:  Head: Normocephalic and atraumatic.  Nose: Nose normal.  Mouth/Throat: Oropharynx is clear and moist. No oropharyngeal exudate.  Eyes: Conjunctivae and EOM are normal. Pupils are equal, round, and reactive to light. No scleral icterus.  Neck: Normal range of motion. Neck supple. No JVD present. No tracheal deviation present. No thyromegaly present.  Cardiovascular: Normal rate, regular rhythm and normal heart sounds.  Exam reveals no gallop and no friction rub.   No murmur heard. Pulmonary/Chest: Effort normal and breath sounds normal. No respiratory distress. She has no wheezes. She exhibits no tenderness.  Abdominal: Soft. Bowel sounds are normal. She exhibits no distension and no mass. There is no tenderness. There is no rebound and no guarding.  Musculoskeletal: Normal range of motion. She exhibits no edema or tenderness.  Lymphadenopathy:    She has no cervical adenopathy.  Neurological: She is alert and oriented to person, place, and time. No cranial nerve deficit. She  exhibits normal muscle tone.  Skin: Skin is warm and dry. No rash noted. No erythema. No pallor.  Nursing note and vitals reviewed.   ED Course  Procedures (including critical care time) DIAGNOSTIC STUDIES: Oxygen Saturation is 99% on RA, normal by my interpretation.    COORDINATION OF CARE:    Labs Review Labs Reviewed  COMPREHENSIVE METABOLIC PANEL - Abnormal; Notable for the following:    Calcium 8.6 (*)    Total Protein 5.8 (*)    Albumin 3.1 (*)    All other components within normal limits  CBC - Abnormal; Notable for the following:    Hemoglobin 10.8 (*)    HCT 33.0 (*)    RDW 15.9 (*)    All other components within normal limits  URINALYSIS, ROUTINE W REFLEX MICROSCOPIC (NOT AT [facility]) - Abnormal; Notable for the following:    APPearance CLOUDY (*)    Specific Gravity, Urine 1.036 (*)    Bilirubin Urine SMALL (*)    Ketones, ur 15 (*)    All other components within normal limits  LIPASE, BLOOD  POC URINE PREG, ED    Imaging Review No results found. I have personally reviewed and evaluated these images and lab results as part of my medical decision-making.   EKG Interpretation None      MDM   Final diagnoses:  None   Patient presents to the ED for abdominal pain and vag discharge.  Her physical was normal.  I see no indication to treat for STD.  She appears well and in NAD.  She was advised to take tylenol or motrin and see a primary care physician within 3 days.  Her VS remain within her normal limits and she is safe for DC. Her episode of hypotension was when she was asleep.  I personally performed the services described in this documentation, which was scribed in my presence. The recorded information has been reviewed and is accurate.    Tomasita Crumble, MD 12/14/14 [phone]

## 2014-12-14 NOTE — ED Notes (Signed)
Dr. Oni at bedside at this time.  

## 2015-10-28 ENCOUNTER — Emergency Department (HOSPITAL_COMMUNITY)
Admission: EM | Admit: 2015-10-28 | Discharge: 2015-10-29 | Disposition: A | Discharge status: Home / Self Care | Payer: Medicaid Other | Attending: Emergency Medicine | Admitting: Emergency Medicine

## 2015-10-28 ENCOUNTER — Encounter: Payer: Self-pay | Admitting: Emergency Medicine

## 2015-10-28 DIAGNOSIS — Z79899 Other long term (current) drug therapy: Secondary | ICD-10-CM | POA: Insufficient documentation

## 2015-10-28 DIAGNOSIS — J45909 Unspecified asthma, uncomplicated: Secondary | ICD-10-CM | POA: Insufficient documentation

## 2015-10-28 DIAGNOSIS — F419 Anxiety disorder, unspecified: Secondary | ICD-10-CM | POA: Insufficient documentation

## 2015-10-28 DIAGNOSIS — R0981 Nasal congestion: Secondary | ICD-10-CM | POA: Insufficient documentation

## 2015-10-28 DIAGNOSIS — Z87891 Personal history of nicotine dependence: Secondary | ICD-10-CM | POA: Insufficient documentation

## 2015-10-28 NOTE — ED Triage Notes (Addendum)
Pt states that she had an anxiety attack tonight. Normally on prozac but has not been taking it. States she had to give her kids up for adoption and is upset about it. Alert and oriented. Pt also states that she has URI symptoms.

## 2015-10-29 NOTE — Discharge Instructions (Signed)
Take over the counter decongestant (such as sudafed), as well as antihistamine (such as claritin or zyrtec), as directed on packaging, for the next week.  Use over the counter normal saline nasal spray, as instructed in the Emergency Department, several times per day for the next 2 weeks.  Call your regular medical doctor and your mental health provider today to schedule a follow up appointment this week.  Return to the Emergency Department immediately if worsening.   Substance Abuse Treatment Programs  Intensive Outpatient Programs [facility]     [address]      [place], [place]                   [phone]       [facility] [address] [place] [address] [place], [place] [phone]  [facility]     (Inpatient and outpatient)     [address]           [phone]    [facility] [phone] (Suboxone and Methadone)  [address]      [place], [place] [address]      [phone]       [address] [place], [place] [phone]  [facility] (Outpatient/Inpatient, Chemical)    (insurance only) [phone]             [facility] (Groups & Residential) [place], [place] [phone]     [facility]     [address]     [place], [place]      [phone]       [facility] (for caregivers and family) [phone] [address] [place], [place] [phone]      Residential Treatment Programs [facility]      [address] [place], [place] [address]  [phone]       [facility] [address] [place], [place] [address] [phone]  [facility] [place]        [phone]       [facility] [phone]  [facility] ([facility]             [address]                                         [place], [place]                                                [phone] or [phone]                                [facility] [address] [place], [address] [phone]  [facility]    [address]      [place], [place]     [phone]       [facility] [address] [place], [place] [phone]  [facility]   [address] [place], [place] [address]     [phone]      Admissions: 8am-3pm M-F  [facility] ([facility]) [address] [place], [place] [phone]  [facility]: Residential Program (  [address])   [place], [place]      [phone] or [phone]     [facility]: [facility] [place], [place] (Walk in Hours over the weekend or by referral)  [facility] [address] [place], [place], [place] [address] [phone]  Crisis Mobile: [facility]:  [phone] (for crisis response 24 hours a day) [facility] Hotline:      8037964431 Outpatient Psychiatry and Counseling  [facility]: Mobile Crisis Management 24 hours:  [phone]  [facility] of the [employer] sliding scale fee and walk in schedule: M-F 8am-12pm/1pm-3pm [address]  [place], [place] [address] [phone]  [facility] [address] [place], [place] [address] [phone]  [facility] (Formerly [facility])- new patient walk-in appointments available Monday - Friday 8am -3pm.          [address] [place], [place] [address] [phone] or crisis line- [phone]  [facility] Health Outpatient Services/ Intensive Outpatient Therapy Program [address] [place], [place] [address] [phone]  [facility]                  Crisis Services      [phone] [address]     [place], [place] [address]                 [facility]   [facility] [phone]. [address] [place], [place] [address]   [employer] [facility]          [address]  [place], [place] [address]       [phone]  [facility] [address] [place], [place] [address] [phone]  [facility]    [address]    [place], [place] [address]     [phone]       Andee Poles, MD     [address]     [place] [address]     [phone]       [facility] [address] [place] [address]  [facility]     [address] [place], [place]      [phone]       [facility] Eulogio Ditch, MD [address] [place], [place] [address] [phone]  [facility]     [address]     [place], [place] [address]     [phone]       [facility] [address] [place], [place] [address] [phone] Resources for Temporary Residential Assistance/Crisis Centers  [facility]) M-F 8am-3pm   [address] [place], [place] [address]   [phone] Services include: laundry, barbering, support groups, case management, phone  & computer access, showers, AA/NA mtgs, mental health/substance abuse nurse, job skills class, disability information, VA assistance, spiritual classes, etc.   HOMELESS SHELTERS  [facility]     [facility]   [address], [place]     [phone]              [employer] (women and children)       [address] [place], [place] [address] [phone] [email or web address] for application and process Application Required  [facility]   [address]    [place] [address]     [phone]                    [facility] [place] [address] [place], [place] [address] [phone] application appt.) Application Required  [facility] (women only)    [address]     [place], [place] [address]     [phone]      Intake starts 6pm daily Need valid ID, SSC, & Police report [employer] [address] [place], [place] [phone] Application Required  [employer] (men only)     [address]      [place], [place]     [phone]       [facility] [place] (Pregnant women only) [address] [place], [place] [phone]  [facility]      [address]      [place], [place] [address]     [phone]             [facility] [address] [place], [place] [phone] 90 day commitment/SA/Application process  [facility](men only)     [address]     [place], [place]     [phone]       Check-in at [facility] [address] [place], [place] [address] [phone] Men/Women/Women and Children must be there by 7 pm  [facility] [place], [place] [phone]

## 2015-10-29 NOTE — ED Provider Notes (Signed)
[facility]-EMERGENCY DEPT Provider Note   CSN: [id number] Arrival date & time: 10/28/15  2324  First Provider Contact:  None       History   Chief Complaint Chief Complaint  Patient presents with  . Anxiety    HPI Claudia Gonzales is a 25 y.o. female.  HPI  Pt was seen at 0735. Per pt, c/o gradual onset and resolution of one episode of "anxiety attack" that occurred last evening. Pt states she has prozac prescribed for same, but is not taking it. 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 Denies SI/HI, no hallucinations. Pt also c/o gradual onset and persistence of constant "stuffy nose" and sinus congestion for the past 5 days. Pt has not taken any medications to treat her symptoms. Denies fevers, no sore throat, no CP/SOB, no cough, no abd pain, no N/V/D.      Past Medical History:  Diagnosis Date  . Anxiety   . Asthma   . Chlamydia   . Preterm labor     Patient Active Problem List   Diagnosis Date Noted  . Encounter for insertion of mirena IUD 10/03/2012  . Traumatic injury during pregnancy 07/21/2012  . Supervision of high risk pregnancy in third trimester 06/13/2012  . H/O premature delivery 05/02/2012  . Anxiety and depression 05/02/2012    Past Surgical History:  Procedure Laterality Date  . APPENDECTOMY    . WISDOM TOOTH EXTRACTION      OB History    Gravida Para Term Preterm AB Living   2 2 1 1   2    SAB TAB Ectopic Multiple Live Births                   Home Medications    Prior to Admission medications   Medication Sig Start Date End Date Taking? Authorizing Provider  Pseudoeph-Doxylamine-DM-APAP (NYQUIL PO) Take 1 capsule by mouth at bedtime as needed (cold).    Yes Historical Provider, MD  naproxen (NAPROSYN) 500 MG tablet Take 1 tablet (500 mg total) by mouth 2 (two) times daily. Patient not taking: Reported on 12/14/2014 12/08/14   Renne Crigler, PA-C    Family History Family History  Problem Relation Age of Onset  . Hypertension  Mother   . Cancer Father     colon  . Cancer Maternal Grandmother   . Cancer Maternal Grandfather   . Cancer Paternal Grandmother   . Cancer Paternal Grandfather     Social History Social History  Substance Use Topics  . Smoking status: Former Smoker    Packs/day: 0.50    Years: 1.00    Types: Cigarettes    Quit date: 06/17/2012  . Smokeless tobacco: Never Used  . Alcohol use 4.2 oz/week    7 Glasses of wine per week     Comment: glass of wine /day     Allergies   Review of patient's allergies indicates no known allergies.   Review of Systems Review of Systems ROS: Statement: All systems negative except as marked or noted in the HPI; Constitutional: Negative for fever and chills. ; ; Eyes: Negative for eye pain, redness and discharge. ; ; ENMT: Negative for ear pain, hoarseness, sore throat. +nasal congestion, sinus pressure. ; ; Cardiovascular: Negative for chest pain, palpitations, diaphoresis, dyspnea and peripheral edema. ; ; Respiratory: Negative for cough, wheezing and stridor. ; ; Gastrointestinal: Negative for nausea, vomiting, diarrhea, abdominal pain, blood in stool, hematemesis, jaundice and rectal bleeding. . ; ; Genitourinary: Negative for  dysuria, flank pain and hematuria. ; ; Musculoskeletal: Negative for back pain and neck pain. Negative for swelling and trauma.; ; Skin: Negative for pruritus, rash, abrasions, blisters, bruising and skin lesion.; ; Neuro: Negative for headache, lightheadedness and neck stiffness. Negative for weakness, altered level of consciousness, altered mental status, extremity weakness, paresthesias, involuntary movement, seizure and syncope.; Psych:  +anxiety. No SI, no SA, no HI, no hallucinations.     Physical Exam Updated Vital Signs BP 129/93 (BP Location: Left Arm)   Pulse 63   Temp 98.2 F (36.8 C) (Oral)   Resp 18   LMP 10/17/2015 (Approximate)   SpO2 100%   Physical Exam 0740: Physical examination:  Nursing notes reviewed;  Vital signs and O2 SAT reviewed;  Constitutional: Well developed, Well nourished, Well hydrated, In no acute distress; Head:  Normocephalic, atraumatic; Eyes: EOMI, PERRL, No scleral icterus; ENMT: TM's clear bilat. +edemetous nasal turbinates bilat with clear rhinorrhea. +edemetous nasal turbinates bilat with clear rhinorrhea. Mouth and pharynx normal, Mucous membranes moist; Neck: Supple, Full range of motion, No lymphadenopathy; Cardiovascular: Regular rate and rhythm, No murmur, rub, or gallop; Respiratory: Breath sounds clear & equal bilaterally, No rales, rhonchi, wheezes.  Speaking full sentences with ease, Normal respiratory effort/excursion; Chest: Nontender, Movement normal; Abdomen: Soft, Nontender, Nondistended, Normal bowel sounds; Genitourinary: No CVA tenderness; Extremities: Pulses normal, No tenderness, No edema, No calf edema or asymmetry.; Neuro: AA&Ox3, Major CN grossly intact.  Speech clear. No gross focal motor or sensory deficits in extremities.; Skin: Color normal, Warm, Dry.; Psych:  Denies SI/HI, no hallucinations.    ED Treatments / Results  Labs (all labs ordered are listed, but only abnormal results are displayed)   EKG   Radiology   Procedures Procedures (including critical care time)  Medications Ordered in ED    Initial Impression / Assessment and Plan / ED Course  I have reviewed the triage vital signs and the nursing notes.  Pertinent labs & imaging results that were available during my care of the patient were reviewed by me and considered in my medical decision making (see chart for details).  MDM Reviewed: previous chart, nursing note and vitals    0815:  Pt sleeping soundly on my arrival to exam room. Denies SI/HI/, no hallucinations. States she "feels better" regarding "anxiety attack" she had earlier but she "can't breathe out of my nose because it's stuffy." Tx symptomatically. Encouraged to take her prozac as previously prescribed and f/u with  mental health counselor for good continuity of care and control of her symptoms. Pt verb understanding and asked for a bus pass to leave. Dx d/w pt.  Questions answered.  Verb understanding, agreeable to d/c home with outpt f/u.    Final Clinical Impressions(s) / ED Diagnoses   Final diagnoses:  None    New Prescriptions New Prescriptions   No medications on file     Samuel Jester, DO 11/01/15 2113

## 2015-10-29 NOTE — ED Notes (Signed)
Pt is resting comfortably in bed with no signs of distress noticed and VSS. Pt reported that she has relaxed since her initial report of anxiety. When asked if pt feels better as far as her anxiety is concerned she stated "Yes I'm just sick".

## 2016-01-01 ENCOUNTER — Encounter (HOSPITAL_COMMUNITY): Payer: Self-pay | Admitting: *Deleted

## 2016-01-01 ENCOUNTER — Emergency Department (HOSPITAL_COMMUNITY)
Admission: EM | Admit: 2016-01-01 | Discharge: 2016-01-01 | Disposition: A | Discharge status: Home / Self Care | Payer: Medicaid Other | Attending: Emergency Medicine | Admitting: Emergency Medicine

## 2016-01-01 DIAGNOSIS — Z87891 Personal history of nicotine dependence: Secondary | ICD-10-CM | POA: Insufficient documentation

## 2016-01-01 DIAGNOSIS — K0889 Other specified disorders of teeth and supporting structures: Secondary | ICD-10-CM | POA: Insufficient documentation

## 2016-01-01 DIAGNOSIS — J45909 Unspecified asthma, uncomplicated: Secondary | ICD-10-CM | POA: Insufficient documentation

## 2016-01-01 MED ORDER — NAPROXEN 500 MG PO TABS: 500.0000 mg | ORAL_TABLET | Freq: Two times a day (BID) | ORAL | 0 refills | Status: DC

## 2016-01-01 MED ORDER — AMOXICILLIN 500 MG PO CAPS
1000.0000 mg | ORAL_CAPSULE | Freq: Once | ORAL | Status: AC
  Administered 2016-01-01: 1000 mg via ORAL
  Filled 2016-01-01: qty 2

## 2016-01-01 MED ORDER — AMOXICILLIN 500 MG PO CAPS: 1000.0000 mg | ORAL_CAPSULE | Freq: Two times a day (BID) | ORAL | 0 refills | Status: DC

## 2016-01-01 MED ORDER — OXYCODONE-ACETAMINOPHEN 5-325 MG PO TABS
1.0000 | ORAL_TABLET | Freq: Once | ORAL | Status: AC
  Administered 2016-01-01: 1 via ORAL
  Filled 2016-01-01: qty 1

## 2016-01-01 MED ORDER — OXYCODONE-ACETAMINOPHEN 5-325 MG PO TABS: 1.0000 | ORAL_TABLET | ORAL | 0 refills | Status: DC | PRN

## 2016-01-01 NOTE — ED Notes (Signed)
Bed: WTR5 Expected date:  Expected time:  Means of arrival:  Comments: 

## 2016-01-01 NOTE — ED Notes (Signed)
Dental pain: pt states her tooth broke off, swelling noted on left side of face. Took Advil at home with mild relief.

## 2016-01-01 NOTE — ED Triage Notes (Signed)
Pt states that she has had dental pain for 7-9 days; pt states that the pain is to the left side; pt states that it feels like she has a broken tooth; pt states that it is too painful to open her mouth for this RN to look; pt states that she has been taking Ibuprofen with minimal relief

## 2016-01-01 NOTE — ED Notes (Signed)
Bed: [facility] Expected date:  Expected time:  Means of arrival:  Comments: 

## 2016-01-01 NOTE — ED Provider Notes (Signed)
[facility]-EMERGENCY DEPT Provider Note   CSN: [id number] Arrival date & time: 01/01/16  0141     History   Chief Complaint Chief Complaint  Patient presents with  . Dental Pain    HPI Claudia Gonzales is a 25 y.o. female. She has history of asthma and anxiety. One week ago, she broke a left upper molar. It has been painful, but has gotten more painful over the last one-2 days. Pain is worse with trying to chew. Nothing makes it better. She's been taking ibuprofen without relief.  The history is provided by the patient.    Past Medical History:  Diagnosis Date  . Anxiety   . Asthma   . Chlamydia   . Preterm labor     Patient Active Problem List   Diagnosis Date Noted  . Encounter for insertion of mirena IUD 10/03/2012  . Traumatic injury during pregnancy 07/21/2012  . Supervision of high risk pregnancy in third trimester 06/13/2012  . H/O premature delivery 05/02/2012  . Anxiety and depression 05/02/2012    Past Surgical History:  Procedure Laterality Date  . APPENDECTOMY    . WISDOM TOOTH EXTRACTION      OB History    Gravida Para Term Preterm AB Living   2 2 1 1   2    SAB TAB Ectopic Multiple Live Births           2       Home Medications    Prior to Admission medications   Medication Sig Start Date End Date Taking? Authorizing Provider  naproxen (NAPROSYN) 500 MG tablet Take 1 tablet (500 mg total) by mouth 2 (two) times daily. Patient not taking: Reported on 12/14/2014 12/08/14   Renne CriglerJoshua Geiple, PA-C  Pseudoeph-Doxylamine-DM-APAP (NYQUIL PO) Take 1 capsule by mouth at bedtime as needed (cold).     Historical Provider, MD    Family History Family History  Problem Relation Age of Onset  . Hypertension Mother   . Cancer Father     colon  . Cancer Maternal Grandmother   . Cancer Maternal Grandfather   . Cancer Paternal Grandmother   . Cancer Paternal Grandfather     Social History Social History  Substance Use Topics  . Smoking status: Former Smoker      Packs/day: 0.50    Years: 1.00    Types: Cigarettes    Quit date: 06/17/2012  . Smokeless tobacco: Never Used  . Alcohol use 4.2 oz/week    7 Glasses of wine per week     Comment: glass of wine /day     Allergies   Review of patient's allergies indicates no known allergies.   Review of Systems Review of Systems  All other systems reviewed and are negative.    Physical Exam Updated Vital Signs BP 115/72 (BP Location: Right Arm)   Pulse 66   Temp 98.2 F (36.8 C) (Oral)   Resp 20   Ht 5\' 6"  (1.676 m)   Wt 210 lb (95.3 kg)   SpO2 99%   BMI 33.89 kg/m   Physical Exam  Nursing note and vitals reviewed.  25 year old female, resting comfortably and in no acute distress. Vital signs are normal. Oxygen saturation is 99%, which is normal. Head is normocephalic and atraumatic. PERRLA, EOMI. Oropharynx is clear. Tooth #12 has been previously removed. Tooth #15 is fractured and tender to percussion. There is no gingival swelling or tenderness. Neck is nontender and supple without adenopathy or JVD. Back is nontender  and there is no CVA tenderness. Lungs are clear without rales, wheezes, or rhonchi. Chest is nontender. Heart has regular rate and rhythm without murmur. Abdomen is soft, flat, nontender without masses or hepatosplenomegaly and peristalsis is normoactive. Extremities have no cyanosis or edema, full range of motion is present. Skin is warm and dry without rash. Neurologic: Mental status is normal, cranial nerves are intact, there are no motor or sensory deficits.  ED Treatments / Results   Procedures Procedures (including critical care time)  Medications Ordered in ED Medications  amoxicillin (AMOXIL) capsule 1,000 mg (not administered)  oxyCODONE-acetaminophen (PERCOCET/ROXICET) 5-325 MG per tablet 1 tablet (not administered)     Initial Impression / Assessment and Plan / ED Course  I have reviewed the triage vital signs and the nursing  notes.  Pertinent labs & imaging results that were available during my care of the patient were reviewed by me and considered in my medical decision making (see chart for details).  Clinical Course   Dental pain secondary to broken tooth. She is given a prescription for amoxicillin, naproxen, oxycodone have acetaminophen and is given dental resources sheet. Old records were reviewed, and she has a prior visit for dental pain into [facility].  Final Clinical Impressions(s) / ED Diagnoses   Final diagnoses:  Pain, dental    New Prescriptions New Prescriptions   AMOXICILLIN (AMOXIL) 500 MG CAPSULE    Take 2 capsules (1,000 mg total) by mouth 2 (two) times daily.   OXYCODONE-ACETAMINOPHEN (PERCOCET) 5-325 MG TABLET    Take 1 tablet by mouth every 4 (four) hours as needed for moderate pain.     Dione Booze, MD 01/01/16 [phone]

## 2016-01-18 ENCOUNTER — Encounter (HOSPITAL_COMMUNITY): Payer: Self-pay | Admitting: Emergency Medicine

## 2016-01-18 ENCOUNTER — Emergency Department (HOSPITAL_COMMUNITY)
Admission: EM | Admit: 2016-01-18 | Discharge: 2016-01-18 | Disposition: A | Discharge status: Home / Self Care | Payer: Medicaid Other | Attending: Emergency Medicine | Admitting: Emergency Medicine

## 2016-01-18 DIAGNOSIS — K029 Dental caries, unspecified: Secondary | ICD-10-CM | POA: Insufficient documentation

## 2016-01-18 DIAGNOSIS — K047 Periapical abscess without sinus: Secondary | ICD-10-CM

## 2016-01-18 DIAGNOSIS — J45909 Unspecified asthma, uncomplicated: Secondary | ICD-10-CM | POA: Insufficient documentation

## 2016-01-18 DIAGNOSIS — Z87891 Personal history of nicotine dependence: Secondary | ICD-10-CM | POA: Insufficient documentation

## 2016-01-18 MED ORDER — HYDROCODONE-ACETAMINOPHEN 5-325 MG PO TABS
1.0000 | ORAL_TABLET | Freq: Once | ORAL | Status: AC
  Administered 2016-01-18: 1 via ORAL
  Filled 2016-01-18: qty 1

## 2016-01-18 MED ORDER — LORAZEPAM 1 MG PO TABS
1.0000 mg | ORAL_TABLET | Freq: Once | ORAL | Status: AC
  Administered 2016-01-18: 1 mg via ORAL
  Filled 2016-01-18: qty 1

## 2016-01-18 MED ORDER — HYDROCODONE-ACETAMINOPHEN 5-325 MG PO TABS: 1.0000 | ORAL_TABLET | ORAL | 0 refills | Status: DC | PRN

## 2016-01-18 MED ORDER — AMOXICILLIN 500 MG PO CAPS: 500.0000 mg | ORAL_CAPSULE | Freq: Three times a day (TID) | ORAL | 0 refills | Status: DC

## 2016-01-18 MED ORDER — HALOPERIDOL LACTATE 5 MG/ML IJ SOLN
5.0000 mg | Freq: Once | INTRAMUSCULAR | Status: AC
  Administered 2016-01-18: 5 mg via INTRAMUSCULAR
  Filled 2016-01-18: qty 1

## 2016-01-18 NOTE — ED Notes (Signed)
Patient still yelling out in pain.  Unable to assess patient, she will not answer questions.

## 2016-01-18 NOTE — Discharge Instructions (Signed)
See your dentist, on 02/04/16, as scheduled.  Try using heat on the sore area 3 or 4 times a day.  You may need to have the tooth extracted.

## 2016-01-18 NOTE — ED Triage Notes (Signed)
Pt reports ongoing dental pain from 3 weeks ago. Also reports she got a yeast infection from amoxicillin. Also having HA from dental pain. Has DDS appointment on 10/31, but could not wait.

## 2016-01-18 NOTE — ED Notes (Signed)
Patient called main ER phone and said she "needs a doctor and she's been waiting 4 hours." Triage RN aware

## 2016-01-18 NOTE — ED Triage Notes (Signed)
Per [facility]. Pt reports part of her tooth broke off an hour ago

## 2016-01-18 NOTE — ED Provider Notes (Signed)
[facility] Provider Note   CSN: [id number] Arrival date & time: 01/18/16  1517     History   Chief Complaint Chief Complaint  Patient presents with  . Dental Pain  . Vaginitis    HPI Claudia Gonzales is a 25 y.o. female.  She presents for dental pain. She is unable to specify anything more about it. She points to her left upper molar as the source of the pain.  Level V caveat- severe pain  HPI  Past Medical History:  Diagnosis Date  . Anxiety   . Asthma   . Chlamydia   . Preterm labor     Patient Active Problem List   Diagnosis Date Noted  . Encounter for insertion of mirena IUD 10/03/2012  . Traumatic injury during pregnancy 07/21/2012  . Supervision of high risk pregnancy in third trimester 06/13/2012  . H/O premature delivery 05/02/2012  . Anxiety and depression 05/02/2012    Past Surgical History:  Procedure Laterality Date  . APPENDECTOMY    . WISDOM TOOTH EXTRACTION      OB History    Gravida Para Term Preterm AB Living   2 2 1 1   2    SAB TAB Ectopic Multiple Live Births           2       Home Medications    Prior to Admission medications   Medication Sig Start Date End Date Taking? Authorizing Provider  naproxen (NAPROSYN) 500 MG tablet Take 1 tablet (500 mg total) by mouth 2 (two) times daily. 01/01/16  Yes Dione Boozeavid Glick, MD  oxyCODONE-acetaminophen (PERCOCET) 5-325 MG tablet Take 1 tablet by mouth every 4 (four) hours as needed for moderate pain. 01/01/16  Yes Dione Boozeavid Glick, MD  amoxicillin (AMOXIL) 500 MG capsule Take 1 capsule (500 mg total) by mouth 3 (three) times daily. 01/18/16   Mancel BaleElliott Lavone Barrientes, MD  HYDROcodone-acetaminophen (NORCO) 5-325 MG tablet Take 1 tablet by mouth every 4 (four) hours as needed. 01/18/16   Mancel BaleElliott Kymari Nuon, MD    Family History Family History  Problem Relation Age of Onset  . Hypertension Mother   . Cancer Father     colon  . Cancer Maternal Grandmother   . Cancer Maternal Grandfather   . Cancer  Paternal Grandmother   . Cancer Paternal Grandfather     Social History Social History  Substance Use Topics  . Smoking status: Former Smoker    Packs/day: 0.50    Years: 1.00    Types: Cigarettes    Quit date: 06/17/2012  . Smokeless tobacco: Never Used  . Alcohol use 4.2 oz/week    7 Glasses of wine per week     Comment: glass of wine /day     Allergies   Review of patient's allergies indicates no known allergies.   Review of Systems Review of Systems  Unable to perform ROS: Other     Physical Exam Updated Vital Signs BP 111/76 (BP Location: Right Arm)   Pulse 60   Temp 98.4 F (36.9 C) (Oral)   Resp 18   SpO2 100%   Physical Exam  Constitutional: She is oriented to person, place, and time. She appears well-developed and well-nourished. She appears distressed (Agitated, tearful).  HENT:  Head: Normocephalic and atraumatic.  No trismus. Large Carie, #15, without associated bleeding, drainage or gum swelling.  Eyes: Conjunctivae and EOM are normal. Pupils are equal, round, and reactive to light.  Neck: Normal range of motion and phonation normal. Neck  supple.  Cardiovascular: Normal rate.   Pulmonary/Chest: Effort normal. She exhibits no tenderness.  Musculoskeletal: Normal range of motion.  Neurological: She is alert and oriented to person, place, and time. She exhibits normal muscle tone.  Skin: Skin is warm and dry.  Psychiatric:  Crying  Nursing note and vitals reviewed.    ED Treatments / Results  Labs (all labs ordered are listed, but only abnormal results are displayed) Labs Reviewed - No data to display  EKG  EKG Interpretation None       Radiology No results found.  Procedures Procedures (including critical care time)  Medications Ordered in ED Medications  HYDROcodone-acetaminophen (NORCO/VICODIN) 5-325 MG per tablet 1 tablet (1 tablet Oral Given 01/18/16 1826)  LORazepam (ATIVAN) tablet 1 mg (1 mg Oral Given 01/18/16 1826)    haloperidol lactate (HALDOL) injection 5 mg (5 mg Intramuscular Given 01/18/16 2022)     Initial Impression / Assessment and Plan / ED Course  I have reviewed the triage vital signs and the nursing notes.  Pertinent labs & imaging results that were available during my care of the patient were reviewed by me and considered in my medical decision making (see chart for details).  Clinical Course  Comment By Time  Patient standing up in the room, naked from the waist down, holding her ears and crying profusely. Mancel Bale, MD 10/14 1900  She is now sitting on the stretcher, continuing to cry hysterically, and talk on the phone at the same time. She states she has had no relief from the medications given so far. She agrees to an injection to treat her pain. Mancel Bale, MD 10/14 1954    Medications  HYDROcodone-acetaminophen (NORCO/VICODIN) 5-325 MG per tablet 1 tablet (1 tablet Oral Given 01/18/16 1826)  LORazepam (ATIVAN) tablet 1 mg (1 mg Oral Given 01/18/16 1826)  haloperidol lactate (HALDOL) injection 5 mg (5 mg Intramuscular Given 01/18/16 2022)    Patient Vitals for the past 24 hrs:  BP Temp Temp src Pulse Resp SpO2  01/18/16 2105 111/76 - - 60 18 100 %  01/18/16 1843 (!) 158/133 - - 107 20 97 %  01/18/16 1523 123/74 98.4 F (36.9 C) Oral 74 18 100 %    10:11 PM Reevaluation with update and discussion. After initial assessment and treatment, an updated evaluation reveals The patient had dramatic resolution of her discomfort after treatment with IM Haldol. Blood pressure is now normal. She is now alert and conversant and states that she feels better. Findings discussed with the patient and all questions were answered. Carollynn Pennywell L    Final Clinical Impressions(s) / ED Diagnoses   Final diagnoses:  Dental infection  Pain due to dental caries   Hysterical reaction, to dental pain. This is a recurrent problem. Patient appears to have a history of anxiety. There is no  indication for further treatment in the emergency setting or immediate dental intervention.  Nursing Notes Reviewed/ Care Coordinated Applicable Imaging Reviewed Interpretation of Laboratory Data incorporated into ED treatment  The patient appears reasonably screened and/or stabilized for discharge and I doubt any other medical condition or other [facility] requiring further screening, evaluation, or treatment in the ED at this time prior to discharge.  Plan: Home Medications- continual usual; Home Treatments- rest, heat; return here if the recommended treatment, does not improve the symptoms; Recommended follow up- PCP prn. Dentist, as scheduled 02/04/16.       New Prescriptions New Prescriptions   AMOXICILLIN (AMOXIL) 500 MG CAPSULE  Take 1 capsule (500 mg total) by mouth 3 (three) times daily.   HYDROCODONE-ACETAMINOPHEN (NORCO) 5-325 MG TABLET    Take 1 tablet by mouth every 4 (four) hours as needed.     Mancel Bale, MD 01/18/16 2213

## 2016-02-28 ENCOUNTER — Encounter (HOSPITAL_COMMUNITY): Payer: Self-pay | Admitting: Emergency Medicine

## 2016-02-28 ENCOUNTER — Emergency Department (HOSPITAL_COMMUNITY)
Admission: EM | Admit: 2016-02-28 | Discharge: 2016-02-28 | Disposition: A | Discharge status: Home / Self Care | Payer: Medicaid Other | Attending: Emergency Medicine | Admitting: Emergency Medicine

## 2016-02-28 DIAGNOSIS — J45909 Unspecified asthma, uncomplicated: Secondary | ICD-10-CM | POA: Insufficient documentation

## 2016-02-28 DIAGNOSIS — K0889 Other specified disorders of teeth and supporting structures: Secondary | ICD-10-CM | POA: Insufficient documentation

## 2016-02-28 DIAGNOSIS — Z79899 Other long term (current) drug therapy: Secondary | ICD-10-CM | POA: Insufficient documentation

## 2016-02-28 DIAGNOSIS — Z87891 Personal history of nicotine dependence: Secondary | ICD-10-CM | POA: Insufficient documentation

## 2016-02-28 MED ORDER — IBUPROFEN 800 MG PO TABS: 800.0000 mg | ORAL_TABLET | Freq: Three times a day (TID) | ORAL | 0 refills | Status: DC

## 2016-02-28 MED ORDER — PENICILLIN V POTASSIUM 500 MG PO TABS: 500.0000 mg | ORAL_TABLET | Freq: Four times a day (QID) | ORAL | 0 refills | Status: DC

## 2016-02-28 MED ORDER — IBUPROFEN 800 MG PO TABS
800.0000 mg | ORAL_TABLET | Freq: Once | ORAL | Status: DC
  Filled 2016-02-28: qty 1

## 2016-02-28 NOTE — Discharge Instructions (Signed)
Take the prescribed medication as directed. Our intervention here in the ED is limited for dental complaints.  You need to follow-up with a dentist.  See resource guide to help with this. Return to the ED for new concerns.

## 2016-02-28 NOTE — ED Provider Notes (Signed)
[facility] Provider Note   CSN: [id number] Arrival date & time: 02/28/16  0813     History   Chief Complaint Chief Complaint  Patient presents with  . Dental Pain    HPI Claudia Landsberghyllis E Gonzales is a 25 y.o. female.  The history is provided by the patient and medical records.  Dental Pain     25 year old female with history of anxiety, asthma, presenting to the ED for dental pain. Patient has been seen for the same recently and is yet to follow-up with dentist. She reports she took amoxicillin for infected left upper tooth about a month ago. States symptoms improved temporarily, but if since returned. She reports she thinks she is developing an abscess. She denies any facial or lip swelling. No difficulty swallowing. No fever or chills. States she is waiting for the [place] orange card so she can see a dentist.  Past Medical History:  Diagnosis Date  . Anxiety   . Asthma   . Chlamydia   . Preterm labor     Patient Active Problem List   Diagnosis Date Noted  . Encounter for insertion of mirena IUD 10/03/2012  . Traumatic injury during pregnancy 07/21/2012  . Supervision of high risk pregnancy in third trimester 06/13/2012  . H/O premature delivery 05/02/2012  . Anxiety and depression 05/02/2012    Past Surgical History:  Procedure Laterality Date  . APPENDECTOMY    . WISDOM TOOTH EXTRACTION      OB History    Gravida Para Term Preterm AB Living   2 2 1 1   2    SAB TAB Ectopic Multiple Live Births           2       Home Medications    Prior to Admission medications   Medication Sig Start Date End Date Taking? Authorizing Provider  amoxicillin (AMOXIL) 500 MG capsule Take 1 capsule (500 mg total) by mouth 3 (three) times daily. 01/18/16   Mancel BaleElliott Wentz, MD  HYDROcodone-acetaminophen (NORCO) 5-325 MG tablet Take 1 tablet by mouth every 4 (four) hours as needed. 01/18/16   Mancel BaleElliott Wentz, MD  naproxen (NAPROSYN) 500 MG tablet Take 1 tablet (500 mg total)  by mouth 2 (two) times daily. 01/01/16   Dione Boozeavid Glick, MD  oxyCODONE-acetaminophen (PERCOCET) 5-325 MG tablet Take 1 tablet by mouth every 4 (four) hours as needed for moderate pain. 01/01/16   Dione Boozeavid Glick, MD    Family History Family History  Problem Relation Age of Onset  . Hypertension Mother   . Cancer Father     colon  . Cancer Maternal Grandmother   . Cancer Maternal Grandfather   . Cancer Paternal Grandmother   . Cancer Paternal Grandfather     Social History Social History  Substance Use Topics  . Smoking status: Former Smoker    Packs/day: 0.50    Years: 1.00    Types: Cigarettes    Quit date: 06/17/2012  . Smokeless tobacco: Never Used  . Alcohol use 4.2 oz/week    7 Glasses of wine per week     Comment: glass of wine /day     Allergies   Patient has no known allergies.   Review of Systems Review of Systems  HENT: Positive for dental problem.   All other systems reviewed and are negative.    Physical Exam Updated Vital Signs BP 120/80   Pulse 77   Temp 97.9 F (36.6 C) (Oral)   SpO2 100%   Physical  Exam  Constitutional: She is oriented to person, place, and time. She appears well-developed and well-nourished.  HENT:  Head: Normocephalic and atraumatic.  Mouth/Throat: Oropharynx is clear and moist.  Teeth largely in very poor dentition, left upper molars are all broken with severe decay, surrounding gingiva appears irritated, some mild swelling of left upper inner cheek, no apparent external swelling of the cheek or neck, handling secretions appropriately, no trismus,normal phonation without stridor  Eyes: Conjunctivae and EOM are normal. Pupils are equal, round, and reactive to light.  Neck: Normal range of motion.  Cardiovascular: Normal rate, regular rhythm and normal heart sounds.   Pulmonary/Chest: Effort normal and breath sounds normal. No respiratory distress. She has no wheezes.  Abdominal: Soft. Bowel sounds are normal. There is no tenderness.  There is no rebound.  Musculoskeletal: Normal range of motion.  Neurological: She is alert and oriented to person, place, and time.  Skin: Skin is warm and dry.  Psychiatric: She has a normal mood and affect.  Nursing note and vitals reviewed.    ED Treatments / Results  Labs (all labs ordered are listed, but only abnormal results are displayed) Labs Reviewed - No data to display  EKG  EKG Interpretation None       Radiology No results found.  Procedures Procedures (including critical care time)  Medications Ordered in ED Medications  ibuprofen (ADVIL,MOTRIN) tablet 800 mg (not administered)     Initial Impression / Assessment and Plan / ED Course  I have reviewed the triage vital signs and the nursing notes.  Pertinent labs & imaging results that were available during my care of the patient were reviewed by me and considered in my medical decision making (see chart for details).  Clinical Course    25 year old female here with dental pain. Has been seen here recently for same, has not yet followed up with dentist. On exam she likely has a recurrent early dental abscess. She has no significant facial or neck swelling. She is handling her secretions well. Clinically I'm not concern for Ludwig's angina.  Will re-start abx and have patient follow-up with dentist.  Discussed plan with patient, she acknowledged understanding and agreed with plan of care.  Return precautions given for new or worsening symptoms.  Final Clinical Impressions(s) / ED Diagnoses   Final diagnoses:  Pain, dental    New Prescriptions New Prescriptions   IBUPROFEN (ADVIL,MOTRIN) 800 MG TABLET    Take 1 tablet (800 mg total) by mouth 3 (three) times daily.   PENICILLIN V POTASSIUM (VEETID) 500 MG TABLET    Take 1 tablet (500 mg total) by mouth 4 (four) times daily.     Garlon HatchetLisa M Kamyia Thomason, PA-C 02/28/16 1007    Mancel BaleElliott Wentz, MD 02/28/16 [phone]

## 2016-02-28 NOTE — ED Triage Notes (Signed)
Pt reports upper left toothache x 1 day. No edema nor signs of abscess.

## 2016-03-25 ENCOUNTER — Encounter (HOSPITAL_COMMUNITY): Payer: Self-pay | Admitting: Neurology

## 2016-03-25 ENCOUNTER — Emergency Department (HOSPITAL_COMMUNITY)
Admission: EM | Admit: 2016-03-25 | Discharge: 2016-03-25 | Disposition: A | Discharge status: Home / Self Care | Payer: Medicaid Other | Attending: Emergency Medicine | Admitting: Emergency Medicine

## 2016-03-25 ENCOUNTER — Emergency Department (HOSPITAL_COMMUNITY): Payer: Medicaid Other

## 2016-03-25 DIAGNOSIS — Z87891 Personal history of nicotine dependence: Secondary | ICD-10-CM | POA: Insufficient documentation

## 2016-03-25 DIAGNOSIS — W25XXXA Contact with sharp glass, initial encounter: Secondary | ICD-10-CM | POA: Insufficient documentation

## 2016-03-25 DIAGNOSIS — Y9301 Activity, walking, marching and hiking: Secondary | ICD-10-CM | POA: Insufficient documentation

## 2016-03-25 DIAGNOSIS — Y929 Unspecified place or not applicable: Secondary | ICD-10-CM | POA: Insufficient documentation

## 2016-03-25 DIAGNOSIS — Z23 Encounter for immunization: Secondary | ICD-10-CM | POA: Insufficient documentation

## 2016-03-25 DIAGNOSIS — J45909 Unspecified asthma, uncomplicated: Secondary | ICD-10-CM | POA: Insufficient documentation

## 2016-03-25 DIAGNOSIS — Y999 Unspecified external cause status: Secondary | ICD-10-CM | POA: Insufficient documentation

## 2016-03-25 DIAGNOSIS — Z79899 Other long term (current) drug therapy: Secondary | ICD-10-CM | POA: Insufficient documentation

## 2016-03-25 DIAGNOSIS — S61422A Laceration with foreign body of left hand, initial encounter: Secondary | ICD-10-CM | POA: Insufficient documentation

## 2016-03-25 MED ORDER — BACITRACIN ZINC 500 UNIT/GM EX OINT: 1.0000 "application " | TOPICAL_OINTMENT | Freq: Two times a day (BID) | CUTANEOUS | 1 refills | Status: DC

## 2016-03-25 MED ORDER — LIDOCAINE-EPINEPHRINE (PF) 2 %-1:200000 IJ SOLN
10.0000 mL | Freq: Once | INTRAMUSCULAR | Status: AC
  Administered 2016-03-25: 10 mL
  Filled 2016-03-25: qty 20

## 2016-03-25 MED ORDER — NAPROXEN 250 MG PO TABS: 250.0000 mg | ORAL_TABLET | Freq: Two times a day (BID) | ORAL | 0 refills | Status: DC

## 2016-03-25 MED ORDER — TETANUS-DIPHTH-ACELL PERTUSSIS 5-2.5-18.5 LF-MCG/0.5 IM SUSP
0.5000 mL | Freq: Once | INTRAMUSCULAR | Status: AC
  Administered 2016-03-25: 0.5 mL via INTRAMUSCULAR
  Filled 2016-03-25: qty 0.5

## 2016-03-25 NOTE — ED Triage Notes (Signed)
Pt reports today she tripped off a curb and twisted her left wrist and has a laceration to her left palm with glass in it. Pt used soap and water to clean her wound in triage. Bleeding controlled.

## 2016-03-25 NOTE — ED Provider Notes (Signed)
[facility]-EMERGENCY DEPT Provider Note   CSN: [id number] Arrival date & time: 03/25/16  1801  By signing my name below, I, Claudia Gonzales, attest that this documentation has been prepared under the direction and in the presence of Claudia Natisha Trzcinski, PA-C Electronically Signed: Soijett Gonzales, ED Scribe. 03/25/16. 8:43 PM.  History   Chief Complaint Chief Complaint  Patient presents with  . Wrist Pain  . Extremity Laceration    HPI Claudia Gonzales is a 25 y.o. female who presents to the Emergency Department complaining of left hand pain onset PTA. Pt notes that she tripped and fell while walking off a curb with her hands outstretched, which caused a laceration to her left wrist with glass shards noted. Pt notes that she is not UTD with her tetanus vaccination. Pt is having associated symptoms of laceration to left hand. She has tried applying pressure without medications with relief of her symptoms. She denies hitting her head, LOC, numbness, tingling, weakness, swelling, and any other symptoms.   The history is provided by the patient. No language interpreter was used.    Past Medical History:  Diagnosis Date  . Anxiety   . Asthma   . Chlamydia   . Preterm labor     Patient Active Problem List   Diagnosis Date Noted  . Encounter for insertion of mirena IUD 10/03/2012  . Traumatic injury during pregnancy 07/21/2012  . Supervision of high risk pregnancy in third trimester 06/13/2012  . H/O premature delivery 05/02/2012  . Anxiety and depression 05/02/2012    Past Surgical History:  Procedure Laterality Date  . APPENDECTOMY    . WISDOM TOOTH EXTRACTION      OB History    Gravida Para Term Preterm AB Living   2 2 1 1   2    SAB TAB Ectopic Multiple Live Births           2       Home Medications    Prior to Admission medications   Medication Sig Start Date End Date Taking? Authorizing Provider  amoxicillin (AMOXIL) 500 MG capsule Take 1 capsule (500 mg total) by mouth 3  (three) times daily. 01/18/16   Mancel Bale, MD  bacitracin ointment Apply 1 application topically 2 (two) times daily. 03/25/16   Everlene Farrier, PA-C  HYDROcodone-acetaminophen (NORCO) 5-325 MG tablet Take 1 tablet by mouth every 4 (four) hours as needed. 01/18/16   Mancel Bale, MD  ibuprofen (ADVIL,MOTRIN) 800 MG tablet Take 1 tablet (800 mg total) by mouth 3 (three) times daily. 02/28/16   Garlon Hatchet, PA-C  naproxen (NAPROSYN) 250 MG tablet Take 1 tablet (250 mg total) by mouth 2 (two) times daily with a meal. 03/25/16   Everlene Farrier, PA-C  oxyCODONE-acetaminophen (PERCOCET) 5-325 MG tablet Take 1 tablet by mouth every 4 (four) hours as needed for moderate pain. 01/01/16   Dione Booze, MD  penicillin v potassium (VEETID) 500 MG tablet Take 1 tablet (500 mg total) by mouth 4 (four) times daily. 02/28/16   Garlon Hatchet, PA-C    Family History Family History  Problem Relation Age of Onset  . Hypertension Mother   . Cancer Father     colon  . Cancer Maternal Grandmother   . Cancer Maternal Grandfather   . Cancer Paternal Grandmother   . Cancer Paternal Grandfather     Social History Social History  Substance Use Topics  . Smoking status: Former Smoker    Packs/day: 0.50    Years: 1.00  Types: Cigarettes    Quit date: 06/17/2012  . Smokeless tobacco: Never Used  . Alcohol use 4.2 oz/week    7 Glasses of wine per week     Comment: glass of wine /day     Allergies   Patient has no known allergies.   Review of Systems Review of Systems  Constitutional: Negative for fever.  Musculoskeletal: Positive for arthralgias (left wrist). Negative for joint swelling.  Skin: Positive for wound (laceration to left wrist). Negative for color change and rash.  Neurological: Negative for syncope, weakness and numbness.     Physical Exam Updated Vital Signs BP 123/76 (BP Location: Right Arm)   Pulse 93   Temp 98.5 F (36.9 C) (Oral)   Resp 16   Ht 5\' 6"  (1.676 m)   Wt  101.2 kg   SpO2 98%   BMI 35.99 kg/m   Physical Exam  Constitutional: She appears well-developed and well-nourished. No distress.  Non-toxic appearing.  HENT:  Head: Normocephalic and atraumatic.  Eyes: Right eye exhibits no discharge. Left eye exhibits no discharge.  Cardiovascular: Normal rate, regular rhythm and intact distal pulses.   Bilateral radial pulses are intact.  Pulmonary/Chest: Effort normal. No respiratory distress.  Neurological: She is alert. Coordination normal.  Sensation is intact in her bilateral distal fingertips.  Skin: Skin is warm and dry. Capillary refill takes less than 2 seconds. Laceration noted. No rash noted. She is not diaphoretic. No erythema. No pallor.  1 cm laceration to the palm of right hand with foreign body present. Bleeding is controlled.   Psychiatric: She has a normal mood and affect. Her behavior is normal.  Nursing note and vitals reviewed.    ED Treatments / Results  DIAGNOSTIC STUDIES: Oxygen Saturation is 98% on RA, nl by my interpretation.    COORDINATION OF CARE: 7:46 PM Discussed treatment plan with pt at bedside which includes laceration repair, update tetanus vaccination, and pt agreed to plan.   Radiology Dg Wrist Complete Left  Result Date: 03/25/2016 CLINICAL DATA:  Ground level fall off a curb, landing on her outstretched wrist with a broken glass bottle EXAM: LEFT WRIST - COMPLETE 3+ VIEW COMPARISON:  None. FINDINGS: There is a fragment of foreign material, measuring approximately 5 x 6 mm, located at the palmar aspect of the hand at the level of the metacarpal bases. These foreign material appears to be in 1 fragment and could represent a piece of glass. No metallic foreign body. No fracture or dislocation. No intra-articular air. Foreign body is only a few mm deep to the nearest skin surface. IMPRESSION: Radiopaque foreign body in the superficial palmar soft tissues of the hand. Negative for fracture or dislocation  Electronically Signed   By: Ellery Plunk M.D.   On: 03/25/2016 19:02    Procedures .Marland KitchenLaceration Repair Date/Time: 03/25/2016 8:23 PM Performed by: Everlene Farrier Authorized by: Everlene Farrier   Consent:    Consent obtained:  Verbal   Consent given by:  Patient   Risks discussed:  Need for additional repair and retained foreign body Anesthesia (see MAR for exact dosages):    Anesthesia method:  Local infiltration   Local anesthetic:  Lidocaine 2% WITH epi Laceration details:    Location:  Hand   Hand location:  L palm   Length (cm):  1 Repair type:    Repair type:  Simple Pre-procedure details:    Preparation:  Patient was prepped and draped in usual sterile fashion and imaging obtained to evaluate  for foreign bodies Exploration:    Hemostasis achieved with:  Epinephrine and direct pressure   Wound exploration: wound explored through full range of motion and entire depth of wound probed and visualized     Wound extent: foreign bodies/material     Contaminated: yes   Treatment:    Area cleansed with:  Saline   Amount of cleaning:  Extensive   Irrigation solution:  Sterile saline   Irrigation volume:  500 ml   Irrigation method:  Pressure wash   Visualized foreign bodies/material removed: yes   Skin repair:    Repair method:  Sutures   Suture size:  4-0   Suture material:  Prolene   Suture technique:  Simple interrupted   Number of sutures:  1 Approximation:    Approximation:  Close Post-procedure details:    Dressing:  Antibiotic ointment and non-adherent dressing   Patient tolerance of procedure:  Tolerated well, no immediate complications .Foreign Body Removal Date/Time: 03/25/2016 8:23 PM Performed by: Everlene FarrierANSIE, Cleston Lautner Authorized by: Everlene FarrierANSIE, Veneda Kirksey  Consent: Verbal consent obtained. Risks and benefits: risks, benefits and alternatives were discussed Consent given by: patient Patient understanding: patient states understanding of the procedure being  performed Required items: required blood products, implants, devices, and special equipment available Patient identity confirmed: verbally with patient, arm band and hospital-assigned identification number Time out: Immediately prior to procedure a "time out" was called to verify the correct patient, procedure, equipment, support staff and site/side marked as required. Body area: skin General location: upper extremity Location details: left hand Anesthesia: local infiltration  Anesthesia: Local Anesthetic: lidocaine 2% with epinephrine  Sedation: Patient sedated: no Patient restrained: no Patient cooperative: yes Localization method: probed Removal mechanism: forceps Dressing: antibiotic ointment and dressing applied Tendon involvement: none Depth: subcutaneous Complexity: simple 1 objects recovered. Objects recovered: glass shard  Post-procedure assessment: foreign body removed Patient tolerance: Patient tolerated the procedure well with no immediate complications   (including critical care time)  Medications Ordered in ED Medications  Tdap (BOOSTRIX) injection 0.5 mL (not administered)  lidocaine-EPINEPHrine (XYLOCAINE W/EPI) 2 %-1:200000 (PF) injection 10 mL (10 mLs Infiltration Given 03/25/16 1954)     Initial Impression / Assessment and Plan / ED Course  I have reviewed the triage vital signs and the nursing notes.  Pertinent imaging results that were available during my care of the patient were reviewed by me and considered in my medical decision making (see chart for details).  Clinical Course    Patient presents to the emergency department after a trip and fall landing on her left palm of her hand. She has a laceration to her left wrist with foreign body. X-ray shows no fracture or dislocation. It does show foreign body. On exam patient has a piece of glass in her small 1 cm laceration. This was removed by me. Laceration repaired with one stitch. Patient advised to  return in 5-7 days for suture removal. Wound care instructions provided. I advised the patient to follow-up with their primary care provider this week. I advised the patient to return to the emergency department with new or worsening symptoms or new concerns. The patient verbalized understanding and agreement with plan.    Final Clinical Impressions(s) / ED Diagnoses   Final diagnoses:  Laceration of left hand with foreign body, initial encounter    New Prescriptions New Prescriptions   BACITRACIN OINTMENT    Apply 1 application topically 2 (two) times daily.   NAPROXEN (NAPROSYN) 250 MG TABLET    Take  1 tablet (250 mg total) by mouth 2 (two) times daily with a meal.   I personally performed the services described in this documentation, which was scribed in my presence. The recorded information has been reviewed and is accurate.       Everlene FarrierWilliam Herve Haug, PA-C 03/25/16 2049    Rolland PorterMark James, MD 04/01/16 [phone]

## 2016-03-25 NOTE — ED Notes (Signed)
ED Provider at bedside. 

## 2016-03-25 NOTE — ED Notes (Signed)
Patient stated that she had donated plasma earlier today and believes that this was the reason for her fall.  Gave patient peanut butter and crackers with Sprite to drink.

## 2016-07-18 IMAGING — CR DG FOOT COMPLETE 3+V*L*
3 series · 3 of 3 positions shown · non-contrast
Comparison: None.

CLINICAL DATA: Chronic left foot pain for 1-1/2 months, worsened
after stepping on rock. Initial encounter.

EXAM:
LEFT FOOT - COMPLETE 3+ VIEW

[foot ap]
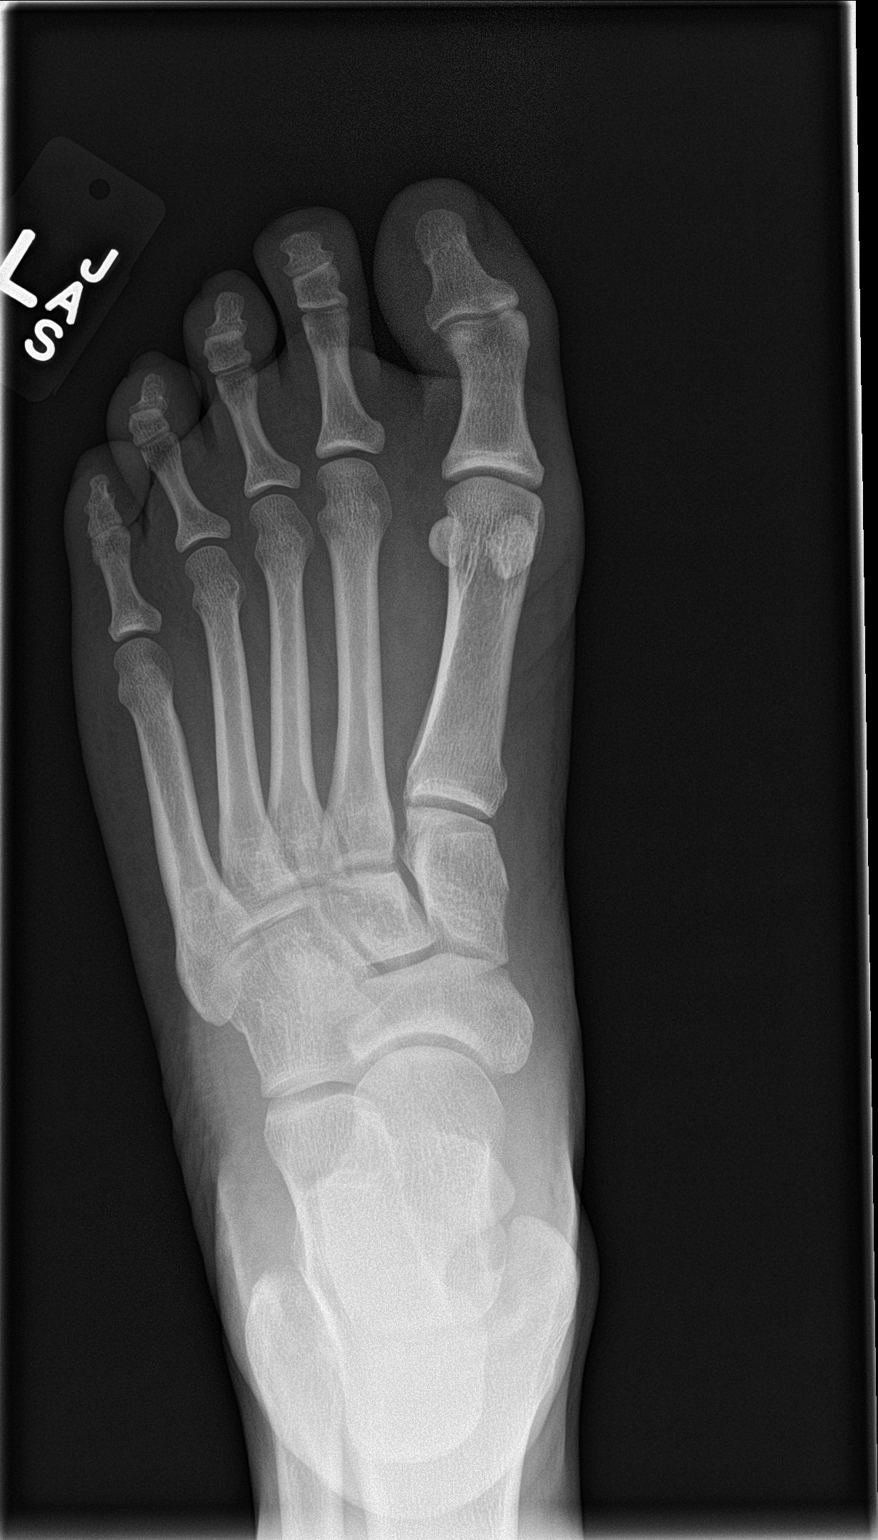

[foot obl]
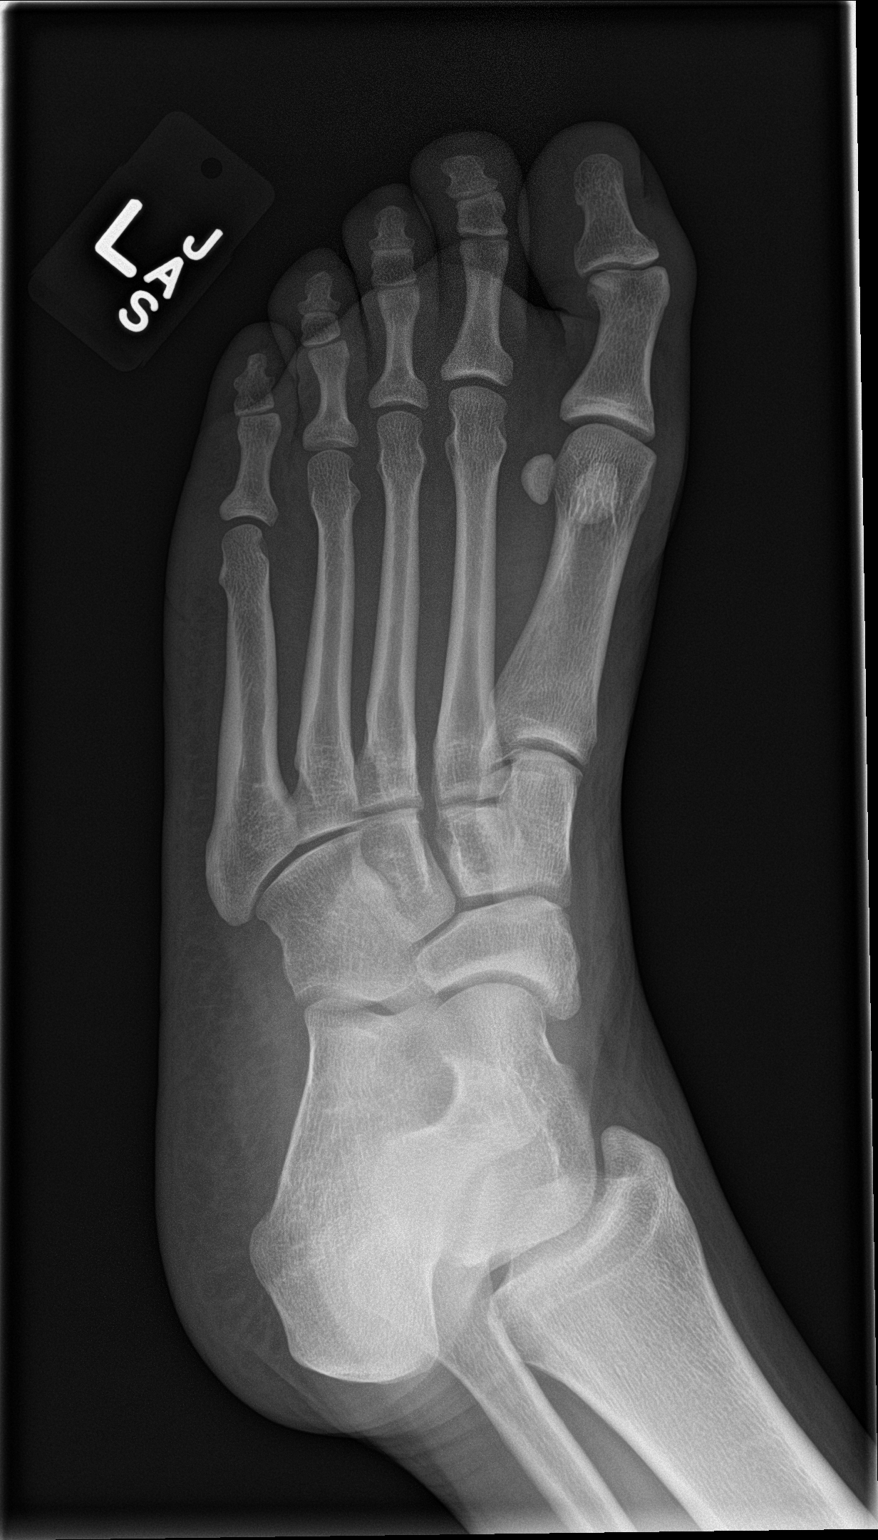

[foot lat]
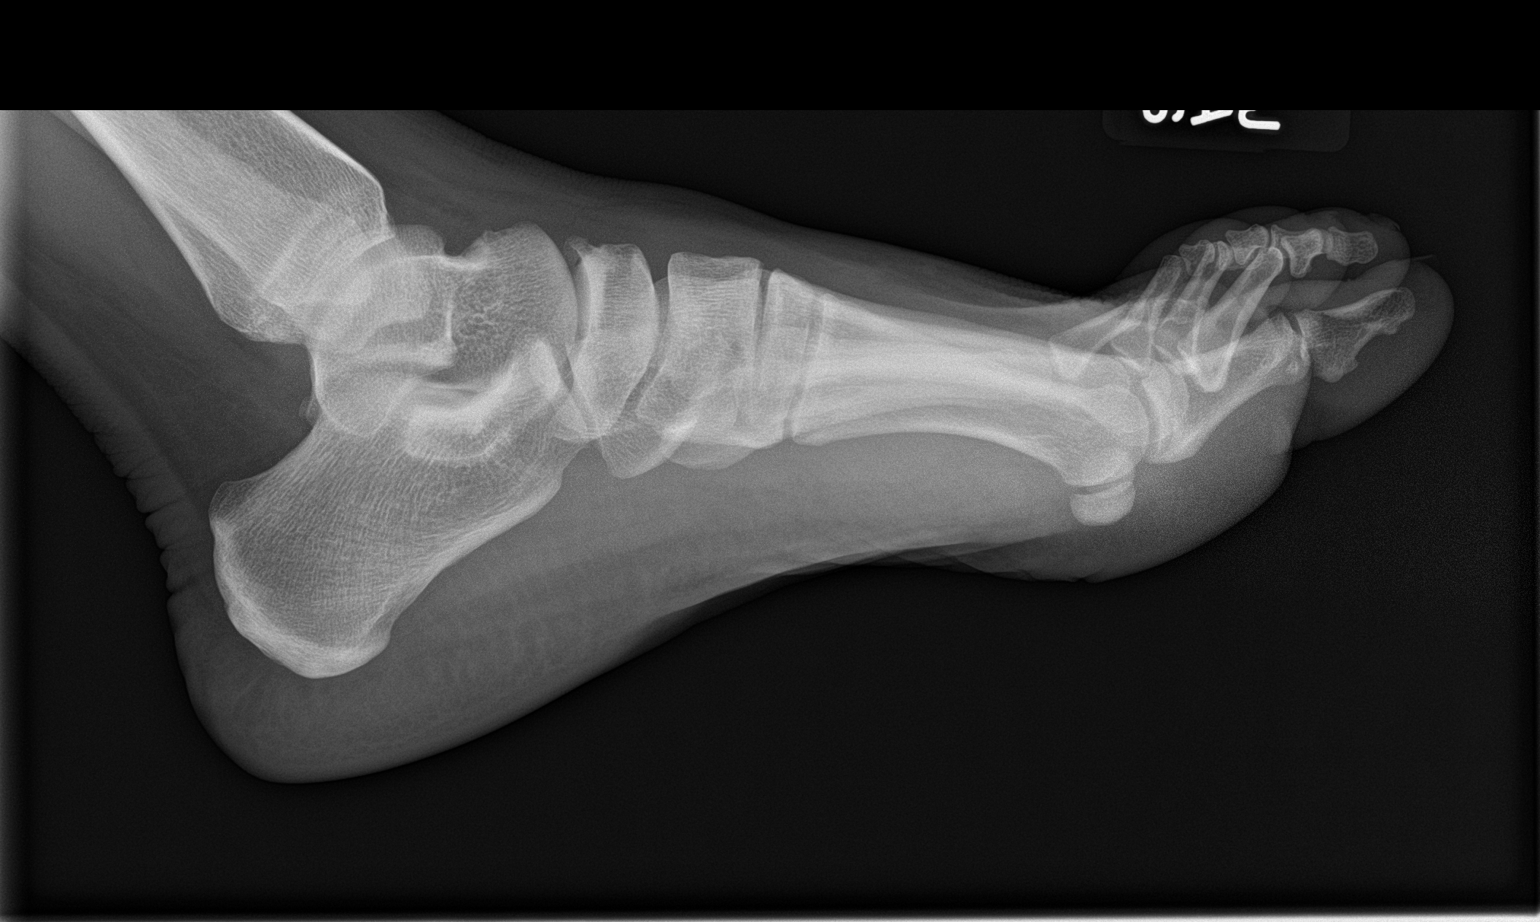

[3 of 3 positions shown; findings below may reference images not displayed]

FINDINGS: There is no evidence of fracture or dislocation. The joint spaces
are preserved. There is no evidence of talar subluxation; the
subtalar joint is unremarkable in appearance.

No significant soft tissue abnormalities are seen.
IMPRESSION: No evidence of fracture or dislocation.

## 2016-12-03 ENCOUNTER — Encounter (HOSPITAL_COMMUNITY): Payer: Self-pay | Admitting: *Deleted

## 2016-12-03 DIAGNOSIS — Z5321 Procedure and treatment not carried out due to patient leaving prior to being seen by health care provider: Secondary | ICD-10-CM | POA: Insufficient documentation

## 2016-12-03 DIAGNOSIS — N938 Other specified abnormal uterine and vaginal bleeding: Secondary | ICD-10-CM | POA: Insufficient documentation

## 2016-12-03 LAB — URINALYSIS, ROUTINE W REFLEX MICROSCOPIC
Bilirubin Urine: NEGATIVE
Glucose, UA: NEGATIVE mg/dL
Hgb urine dipstick: NEGATIVE
Ketones, ur: NEGATIVE mg/dL
Leukocytes, UA: NEGATIVE
Nitrite: NEGATIVE
Protein, ur: NEGATIVE mg/dL
Specific Gravity, Urine: 1.024 (ref 1.005–1.030)
pH: 6 (ref 5.0–8.0)

## 2016-12-03 LAB — CBC
HCT: 35.7 % — ABNORMAL LOW (ref 36.0–46.0)
Hemoglobin: 11.9 g/dL — ABNORMAL LOW (ref 12.0–15.0)
MCH: 29.1 pg (ref 26.0–34.0)
MCHC: 33.3 g/dL (ref 30.0–36.0)
MCV: 87.3 fL (ref 78.0–100.0)
Platelets: 265 K/uL (ref 150–400)
RBC: 4.09 MIL/uL (ref 3.87–5.11)
RDW: 13.1 % (ref 11.5–15.5)
WBC: 7.8 K/uL (ref 4.0–10.5)

## 2016-12-03 LAB — HCG, QUANTITATIVE, PREGNANCY: hCG, Beta Chain, Quant, S: <1 m[IU]/mL

## 2016-12-03 LAB — I-STAT BETA HCG BLOOD, ED (MC, WL, AP ONLY)

## 2016-12-03 NOTE — ED Notes (Signed)
Pt checks in with me.  She is not in any distress at this time.  She states that her last period was in July and the end of July she took a pregnancy test and it was positive.  She has been having intermittent bleeding since.  No pain or vaginal bleeding at this time.  Reassured pt and explained delay to her

## 2016-12-03 NOTE — ED Triage Notes (Signed)
Pt reports onset of dark red vaginal bleeding today. Pt states positive pregnancy test the last week of July. LMP was the beginning of July. Pt reports lower abdominal pain and back cramping since about 2pm. Last took "a piece of a  tylenol PM 500 mg" at 1630 today.

## 2016-12-04 ENCOUNTER — Emergency Department (HOSPITAL_COMMUNITY)
Admission: EM | Admit: 2016-12-04 | Discharge: 2016-12-04 | Disposition: A | Discharge status: Home / Self Care | Payer: Medicaid Other | Attending: Emergency Medicine | Admitting: Emergency Medicine

## 2016-12-04 NOTE — ED Notes (Signed)
No response in lobby for vitals 

## 2017-04-15 ENCOUNTER — Emergency Department (HOSPITAL_COMMUNITY)
Admission: EM | Admit: 2017-04-15 | Discharge: 2017-04-15 | Disposition: A | Discharge status: Home / Self Care | Payer: Self-pay | Attending: Emergency Medicine | Admitting: Emergency Medicine

## 2017-04-15 ENCOUNTER — Encounter (HOSPITAL_COMMUNITY): Payer: Self-pay

## 2017-04-15 ENCOUNTER — Other Ambulatory Visit: Payer: Self-pay

## 2017-04-15 DIAGNOSIS — J45909 Unspecified asthma, uncomplicated: Secondary | ICD-10-CM | POA: Insufficient documentation

## 2017-04-15 DIAGNOSIS — Z87891 Personal history of nicotine dependence: Secondary | ICD-10-CM | POA: Insufficient documentation

## 2017-04-15 DIAGNOSIS — Z79899 Other long term (current) drug therapy: Secondary | ICD-10-CM | POA: Insufficient documentation

## 2017-04-15 DIAGNOSIS — J029 Acute pharyngitis, unspecified: Secondary | ICD-10-CM | POA: Insufficient documentation

## 2017-04-15 LAB — RAPID STREP SCREEN (MED CTR MEBANE ONLY): STREPTOCOCCUS, GROUP A SCREEN (DIRECT): NEGATIVE

## 2017-04-15 LAB — POC URINE PREG, ED: Preg Test, Ur: NEGATIVE

## 2017-04-15 MED ORDER — KETOROLAC TROMETHAMINE 30 MG/ML IJ SOLN
30.0000 mg | Freq: Once | INTRAMUSCULAR | Status: AC
  Administered 2017-04-15: 30 mg via INTRAMUSCULAR
  Filled 2017-04-15: qty 1

## 2017-04-15 MED ORDER — IBUPROFEN 600 MG PO TABS: 600.0000 mg | ORAL_TABLET | Freq: Four times a day (QID) | ORAL | 0 refills | Status: DC | PRN

## 2017-04-15 MED ORDER — DEXAMETHASONE SODIUM PHOSPHATE 10 MG/ML IJ SOLN
10.0000 mg | Freq: Once | INTRAMUSCULAR | Status: AC
  Administered 2017-04-15: 10 mg via INTRAVENOUS
  Filled 2017-04-15: qty 1

## 2017-04-15 NOTE — ED Triage Notes (Signed)
Pt reports coming from home with symptoms of swollen tonsils, headache, and some nasal soreness. No fevers. Started yesterday.
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
## 2017-04-15 NOTE — ED Notes (Signed)
Pt departed in NAD, refused use of wheelchair.  

## 2017-04-15 NOTE — ED Notes (Signed)
Name called x 2 no response

## 2017-04-15 NOTE — Discharge Instructions (Addendum)
Please read and follow all provided instructions.  Your diagnoses today include:  1. Sore throat     Tests performed today include: Strep test: was negative for strep throat Strep culture: you will be notified if this comes back positive Vital signs. See below for your results today.   Medications prescribed:   You are prescribed Ibuprofen, a non-steroidal anti-inflammatory agent (NSAID) for pain. You may take 600mg  every 6 hours as needed for pain. If still requiring this medication around the clock for acute pain after 10 days, please see your primary healthcare provider.  You may combine this medication with Tylenol, 650 mg every 6 hours, so you are receiving something for pain every 3 hours.  This is not a long-term medication unless under the care and direction of your primary provider. Taking this medication long-term and not under the supervision of a healthcare provider could increase the risk of stomach ulcers, kidney problems, and cardiovascular problems such as high blood pressure.   Home care instructions:  Please read the educational materials provided and follow any instructions contained in this packet.  Follow-up instructions: Please follow-up with your primary care provider as needed for further evaluation of your symptoms.  Return instructions:  Please return to the Emergency Department if you experience worsening symptoms.  Return if you have worsening problems swallowing, your neck becomes swollen, you cannot swallow your saliva or your voice becomes muffled.  Return with high persistent fever, persistent vomiting, or if you have trouble breathing.  Please return if you have any other emergent concerns.  Additional Information:  Your vital signs today were: BP 121/82 (BP Location: Right Arm)    Pulse 81    Temp 98.7 F (37.1 C) (Oral)    Resp 16    Ht 5\' 6"  (1.676 m)    Wt 108.9 kg (240 lb)    LMP 04/12/2017    SpO2 100%    BMI 38.74 kg/m  If your blood  pressure (BP) was elevated above 135/85 this visit, please have this repeated by your doctor within one month. --------------
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
## 2017-04-16 LAB — GC/CHLAMYDIA PROBE AMP (~~LOC~~) NOT AT ARMC
CHLAMYDIA, DNA PROBE: NEGATIVE
NEISSERIA GONORRHEA: POSITIVE — AB

## 2017-04-18 LAB — CULTURE, GROUP A STREP (THRC)

## 2017-06-25 ENCOUNTER — Other Ambulatory Visit: Payer: Self-pay

## 2017-06-25 ENCOUNTER — Emergency Department (HOSPITAL_COMMUNITY)
Admission: EM | Admit: 2017-06-25 | Discharge: 2017-06-25 | Disposition: A | Discharge status: Home / Self Care | Payer: Self-pay | Attending: Emergency Medicine | Admitting: Emergency Medicine

## 2017-06-25 ENCOUNTER — Encounter (HOSPITAL_COMMUNITY): Payer: Self-pay

## 2017-06-25 DIAGNOSIS — M549 Dorsalgia, unspecified: Secondary | ICD-10-CM | POA: Insufficient documentation

## 2017-06-25 DIAGNOSIS — M542 Cervicalgia: Secondary | ICD-10-CM | POA: Insufficient documentation

## 2017-06-25 DIAGNOSIS — Z5321 Procedure and treatment not carried out due to patient leaving prior to being seen by health care provider: Secondary | ICD-10-CM | POA: Insufficient documentation

## 2017-06-25 NOTE — ED Notes (Signed)
Bed: WTR6 Expected date:  Expected time:  Means of arrival:  Comments: 
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
## 2017-06-26 ENCOUNTER — Emergency Department (HOSPITAL_COMMUNITY): Payer: No Typology Code available for payment source

## 2017-06-26 ENCOUNTER — Encounter (HOSPITAL_COMMUNITY): Payer: Self-pay | Admitting: *Deleted

## 2017-06-26 ENCOUNTER — Emergency Department (HOSPITAL_COMMUNITY)
Admission: EM | Admit: 2017-06-26 | Discharge: 2017-06-26 | Disposition: A | Discharge status: Home / Self Care | Payer: No Typology Code available for payment source | Attending: Emergency Medicine | Admitting: Emergency Medicine

## 2017-06-26 DIAGNOSIS — Z79899 Other long term (current) drug therapy: Secondary | ICD-10-CM | POA: Insufficient documentation

## 2017-06-26 DIAGNOSIS — Y9389 Activity, other specified: Secondary | ICD-10-CM | POA: Insufficient documentation

## 2017-06-26 DIAGNOSIS — R0789 Other chest pain: Secondary | ICD-10-CM | POA: Diagnosis not present

## 2017-06-26 DIAGNOSIS — Z87891 Personal history of nicotine dependence: Secondary | ICD-10-CM | POA: Diagnosis not present

## 2017-06-26 DIAGNOSIS — J45909 Unspecified asthma, uncomplicated: Secondary | ICD-10-CM | POA: Diagnosis not present

## 2017-06-26 DIAGNOSIS — S060X0A Concussion without loss of consciousness, initial encounter: Secondary | ICD-10-CM | POA: Diagnosis not present

## 2017-06-26 DIAGNOSIS — Y999 Unspecified external cause status: Secondary | ICD-10-CM | POA: Diagnosis not present

## 2017-06-26 DIAGNOSIS — Y92410 Unspecified street and highway as the place of occurrence of the external cause: Secondary | ICD-10-CM | POA: Diagnosis not present

## 2017-06-26 DIAGNOSIS — Z3202 Encounter for pregnancy test, result negative: Secondary | ICD-10-CM | POA: Diagnosis not present

## 2017-06-26 DIAGNOSIS — S098XXA Other specified injuries of head, initial encounter: Secondary | ICD-10-CM | POA: Diagnosis present

## 2017-06-26 LAB — POC URINE PREG, ED: PREG TEST UR: NEGATIVE

## 2017-06-26 MED ORDER — MELOXICAM 15 MG PO TABS: 15.0000 mg | ORAL_TABLET | Freq: Every day | ORAL | 0 refills | Status: DC

## 2017-06-26 MED ORDER — METHOCARBAMOL 500 MG PO TABS: 500.0000 mg | ORAL_TABLET | Freq: Once | ORAL | Status: DC

## 2017-06-26 MED ORDER — ACETAMINOPHEN 325 MG PO TABS
650.0000 mg | ORAL_TABLET | Freq: Once | ORAL | Status: AC
  Administered 2017-06-26: 650 mg via ORAL
  Filled 2017-06-26: qty 2

## 2017-06-26 MED ORDER — METHOCARBAMOL 500 MG PO TABS: 500.0000 mg | ORAL_TABLET | Freq: Two times a day (BID) | ORAL | 0 refills | Status: DC
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
## 2017-08-03 ENCOUNTER — Encounter (HOSPITAL_COMMUNITY): Payer: Self-pay

## 2017-08-03 DIAGNOSIS — J45909 Unspecified asthma, uncomplicated: Secondary | ICD-10-CM | POA: Insufficient documentation

## 2017-08-03 DIAGNOSIS — R112 Nausea with vomiting, unspecified: Secondary | ICD-10-CM | POA: Insufficient documentation

## 2017-08-03 DIAGNOSIS — Z79899 Other long term (current) drug therapy: Secondary | ICD-10-CM | POA: Insufficient documentation

## 2017-08-03 DIAGNOSIS — R197 Diarrhea, unspecified: Secondary | ICD-10-CM | POA: Insufficient documentation

## 2017-08-03 DIAGNOSIS — Z87891 Personal history of nicotine dependence: Secondary | ICD-10-CM | POA: Insufficient documentation

## 2017-08-03 NOTE — ED Triage Notes (Signed)
Pt complains of vomiting and diarrhea since this am, she says she ate a [employer] hamburger and woke up sick Pt only vomited this am but is nauseated and has no appetite this evening

## 2017-08-04 ENCOUNTER — Emergency Department (HOSPITAL_COMMUNITY)
Admission: EM | Admit: 2017-08-04 | Discharge: 2017-08-04 | Disposition: A | Discharge status: Home / Self Care | Payer: Self-pay | Attending: Emergency Medicine | Admitting: Emergency Medicine

## 2017-08-04 DIAGNOSIS — R112 Nausea with vomiting, unspecified: Secondary | ICD-10-CM

## 2017-08-04 DIAGNOSIS — R197 Diarrhea, unspecified: Secondary | ICD-10-CM

## 2017-08-04 MED ORDER — ONDANSETRON 4 MG PO TBDP: 4.0000 mg | ORAL_TABLET | Freq: Three times a day (TID) | ORAL | 0 refills | Status: DC | PRN

## 2017-08-04 MED ORDER — ONDANSETRON 8 MG PO TBDP
8.0000 mg | ORAL_TABLET | Freq: Once | ORAL | Status: AC
  Administered 2017-08-04: 8 mg via ORAL
  Filled 2017-08-04: qty 1

## 2017-08-04 NOTE — ED Provider Notes (Signed)
[facility]   CSN: [id number] Arrival date & time: 08/03/17  2122     History   Chief Complaint Chief Complaint  Patient presents with  . Emesis    HPI Claudia Gonzales is a 27 y.o. female.  Patient presents to the emergency department with a chief complaint of nausea, vomiting, diarrhea.  She states that the symptoms started today.  She denies known sick contacts.  She denies any fevers or chills.  She denies any abdominal pain.  She denies any other associated symptoms.  The history is provided by the patient. No language interpreter was used.    Past Medical History:  Diagnosis Date  . Anxiety   . Asthma   . Chlamydia   . Preterm labor     Patient Active Problem List   Diagnosis Date Noted  . Encounter for insertion of mirena IUD 10/03/2012  . Traumatic injury during pregnancy 07/21/2012  . Supervision of high risk pregnancy in third trimester 06/13/2012  . H/O premature delivery 05/02/2012  . Anxiety and depression 05/02/2012    Past Surgical History:  Procedure Laterality Date  . APPENDECTOMY    . WISDOM TOOTH EXTRACTION       OB History    Gravida  3   Para  2   Term  1   Preterm  1   AB      Living  2     SAB      TAB      Ectopic      Multiple      Live Births  2            Home Medications    Prior to Admission medications   Medication Sig Start Date End Date Taking? Authorizing Provider  amoxicillin (AMOXIL) 500 MG capsule Take 1 capsule (500 mg total) by mouth 3 (three) times daily. Patient not taking: Reported on 08/04/2017 01/18/16   Mancel Bale, MD  bacitracin ointment Apply 1 application topically 2 (two) times daily. Patient not taking: Reported on 08/04/2017 03/25/16   Everlene Farrier, PA-C  HYDROcodone-acetaminophen (NORCO) 5-325 MG tablet Take 1 tablet by mouth every 4 (four) hours as needed. Patient not taking: Reported on 08/04/2017 01/18/16   Mancel Bale, MD    ibuprofen (ADVIL,MOTRIN) 600 MG tablet Take 1 tablet (600 mg total) by mouth every 6 (six) hours as needed. Patient not taking: Reported on 08/04/2017 04/15/17   Elisha Ponder, PA-C  meloxicam (MOBIC) 15 MG tablet Take 1 tablet (15 mg total) by mouth daily. Patient not taking: Reported on 08/04/2017 06/26/17   Maczis, Elmer Sow, PA-C  methocarbamol (ROBAXIN) 500 MG tablet Take 1 tablet (500 mg total) by mouth 2 (two) times daily. Patient not taking: Reported on 08/04/2017 06/26/17   Maczis, Elmer Sow, PA-C  naproxen (NAPROSYN) 250 MG tablet Take 1 tablet (250 mg total) by mouth 2 (two) times daily with a meal. Patient not taking: Reported on 08/04/2017 03/25/16   Everlene Farrier, PA-C  oxyCODONE-acetaminophen (PERCOCET) 5-325 MG tablet Take 1 tablet by mouth every 4 (four) hours as needed for moderate pain. Patient not taking: Reported on 08/04/2017 01/01/16   Dione Booze, MD  penicillin v potassium (VEETID) 500 MG tablet Take 1 tablet (500 mg total) by mouth 4 (four) times daily. Patient not taking: Reported on 08/04/2017 02/28/16   Garlon Hatchet, PA-C    Family History Family History  Problem Relation Age of Onset  . Hypertension  Mother   . Cancer Father        colon  . Cancer Maternal Grandmother   . Cancer Maternal Grandfather   . Cancer Paternal Grandmother   . Cancer Paternal Grandfather     Social History Social History   Tobacco Use  . Smoking status: Former Smoker    Packs/day: 0.50    Years: 1.00    Pack years: 0.50    Types: Cigarettes    Last attempt to quit: 06/17/2012    Years since quitting: 5.1  . Smokeless tobacco: Never Used  Substance Use Topics  . Alcohol use: Not Currently    Alcohol/week: 4.2 oz    Types: 7 Glasses of wine per week    Comment: glass of wine /day  . Drug use: No     Allergies   Patient has no known allergies.   Review of Systems Review of Systems  All other systems reviewed and are negative.    Physical Exam Updated Vital  Signs BP 121/75 (BP Location: Left Arm)   Pulse 68   Temp 98.6 F (37 C) (Oral)   Resp 16   Ht  (1.676 m)   Wt 111.6 kg (246 lb)   LMP 06/24/2017   SpO2 99%   BMI 39.71 kg/m   Physical Exam  Constitutional: She is oriented to person, place, and time. She appears well-developed and well-nourished.  HENT:  Head: Normocephalic and atraumatic.  Eyes: Pupils are equal, round, and reactive to light. Conjunctivae and EOM are normal.  Neck: Normal range of motion. Neck supple.  Cardiovascular: Normal rate and regular rhythm. Exam reveals no gallop and no friction rub.  No murmur heard. Pulmonary/Chest: Effort normal and breath sounds normal. No respiratory distress. She has no wheezes. She has no rales. She exhibits no tenderness.  Abdominal: Soft. Bowel sounds are normal. She exhibits no distension and no mass. There is no tenderness. There is no rebound and no guarding.  No focal abdominal tenderness, no RLQ tenderness or pain at McBurney's point, no RUQ tenderness or Murphy's sign, no left-sided abdominal tenderness, no fluid wave, or signs of peritonitis   Musculoskeletal: Normal range of motion. She exhibits no edema or tenderness.  Neurological: She is alert and oriented to person, place, and time.  Skin: Skin is warm and dry.  Psychiatric: She has a normal mood and affect. Her behavior is normal. Judgment and thought content normal.  Nursing note and vitals reviewed.    ED Treatments / Results  Labs (all labs ordered are listed, but only abnormal results are displayed) Labs Reviewed - No data to display  EKG None  Radiology No results found.  Procedures Procedures (including critical care time)  Medications Ordered in ED Medications  ondansetron (ZOFRAN-ODT) disintegrating tablet 8 mg (has no administration in time range)     Initial Impression / Assessment and Plan / ED Course  I have reviewed the triage vital signs and the nursing notes.  Pertinent labs &  imaging results that were available during my care of the patient were reviewed by me and considered in my medical decision making (see chart for details).     Patient with nausea, vomiting, diarrhea that started today.  She reports watery diarrhea.  She denies any fevers chills.  Denies any abdominal pain.  Her abdomen is soft and nontender.  Doubt surgical or acute abdomen.  Denies any urinary or vaginal complaints.  Newly pregnant, but with no discharge, bleeding, or abdominal pain, no  further workup indicated.  Final Clinical Impressions(s) / ED Diagnoses   Final diagnoses:  Nausea vomiting and diarrhea    ED Discharge Orders        Ordered    ondansetron (ZOFRAN ODT) 4 MG disintegrating tablet  Every 8 hours PRN     08/04/17 0111       Roxy Horseman, PA-C 08/04/17 0113    Devoria Albe, MD 08/04/17 [phone]

## 2017-10-28 ENCOUNTER — Emergency Department (HOSPITAL_COMMUNITY)
Admission: EM | Admit: 2017-10-28 | Discharge: 2017-10-29 | Discharge status: Against Medical Advice | Payer: Self-pay | Attending: Emergency Medicine | Admitting: Emergency Medicine

## 2017-10-28 ENCOUNTER — Encounter (HOSPITAL_COMMUNITY): Payer: Self-pay

## 2017-10-28 DIAGNOSIS — R109 Unspecified abdominal pain: Secondary | ICD-10-CM | POA: Insufficient documentation

## 2017-10-28 DIAGNOSIS — Z5321 Procedure and treatment not carried out due to patient leaving prior to being seen by health care provider: Secondary | ICD-10-CM | POA: Insufficient documentation

## 2017-10-28 LAB — COMPREHENSIVE METABOLIC PANEL
ALT: 20 U/L (ref 0–44)
ANION GAP: 9 (ref 5–15)
AST: 17 U/L (ref 15–41)
Albumin: 3.6 g/dL (ref 3.5–5.0)
Alkaline Phosphatase: 52 U/L (ref 38–126)
BUN: 18 mg/dL (ref 6–20)
CHLORIDE: 105 mmol/L (ref 98–111)
CO2: 23 mmol/L (ref 22–32)
CREATININE: 0.96 mg/dL (ref 0.44–1.00)
Calcium: 9.3 mg/dL (ref 8.9–10.3)
GFR calc non Af Amer: >60 mL/min (ref >60)
Glucose, Bld: 95 mg/dL (ref 70–99)
Potassium: 4 mmol/L (ref 3.5–5.1)
SODIUM: 137 mmol/L (ref 135–145)
Total Bilirubin: 0.4 mg/dL (ref 0.3–1.2)
Total Protein: 7.3 g/dL (ref 6.5–8.1)

## 2017-10-28 LAB — URINALYSIS, ROUTINE W REFLEX MICROSCOPIC
Bilirubin Urine: NEGATIVE
GLUCOSE, UA: NEGATIVE mg/dL
KETONES UR: NEGATIVE mg/dL
LEUKOCYTES UA: NEGATIVE
Nitrite: NEGATIVE
PROTEIN: NEGATIVE mg/dL
Specific Gravity, Urine: 1.026 (ref 1.005–1.030)
pH: 6 (ref 5.0–8.0)

## 2017-10-28 LAB — CBC
HCT: 34.6 % — ABNORMAL LOW (ref 36.0–46.0)
HEMOGLOBIN: 11.3 g/dL — AB (ref 12.0–15.0)
MCH: 29 pg (ref 26.0–34.0)
MCHC: 32.7 g/dL (ref 30.0–36.0)
MCV: 88.7 fL (ref 78.0–100.0)
PLATELETS: 275 10*3/uL (ref 150–400)
RBC: 3.9 MIL/uL (ref 3.87–5.11)
RDW: 13.2 % (ref 11.5–15.5)
WBC: 8.7 10*3/uL (ref 4.0–10.5)

## 2017-10-28 LAB — I-STAT BETA HCG BLOOD, ED (MC, WL, AP ONLY)

## 2017-10-28 LAB — LIPASE, BLOOD: LIPASE: 43 U/L (ref 11–51)

## 2017-10-28 LAB — I-STAT TROPONIN, ED: Troponin i, poc: 0 ng/mL (ref 0.00–0.08)

## 2017-10-28 NOTE — ED Triage Notes (Signed)
Pt reports that she has been on her period for the past four weeks, recently changed birth control and has not helped. Lower abd cramping, vomit x 2 since yesterday, feels anxious like her heart is racing and SOB, denies CP

## 2017-10-29 ENCOUNTER — Emergency Department (HOSPITAL_COMMUNITY): Payer: Self-pay

## 2017-10-29 ENCOUNTER — Other Ambulatory Visit: Payer: Self-pay

## 2017-10-29 ENCOUNTER — Encounter (HOSPITAL_COMMUNITY): Payer: Self-pay

## 2017-10-29 ENCOUNTER — Emergency Department (HOSPITAL_COMMUNITY)
Admission: EM | Admit: 2017-10-29 | Discharge: 2017-10-29 | Disposition: A | Discharge status: Home / Self Care | Payer: Self-pay | Attending: Emergency Medicine | Admitting: Emergency Medicine

## 2017-10-29 DIAGNOSIS — F419 Anxiety disorder, unspecified: Secondary | ICD-10-CM | POA: Insufficient documentation

## 2017-10-29 DIAGNOSIS — Z87891 Personal history of nicotine dependence: Secondary | ICD-10-CM | POA: Insufficient documentation

## 2017-10-29 DIAGNOSIS — J45909 Unspecified asthma, uncomplicated: Secondary | ICD-10-CM | POA: Insufficient documentation

## 2017-10-29 DIAGNOSIS — F329 Major depressive disorder, single episode, unspecified: Secondary | ICD-10-CM | POA: Insufficient documentation

## 2017-10-29 DIAGNOSIS — M25561 Pain in right knee: Secondary | ICD-10-CM | POA: Insufficient documentation

## 2017-10-29 MED ORDER — NAPROXEN 375 MG PO TABS: 375.0000 mg | ORAL_TABLET | Freq: Two times a day (BID) | ORAL | 0 refills | Status: DC

## 2017-10-29 MED ORDER — NAPROXEN 250 MG PO TABS
500.0000 mg | ORAL_TABLET | Freq: Once | ORAL | Status: AC
  Administered 2017-10-29: 500 mg via ORAL
  Filled 2017-10-29: qty 2

## 2017-10-29 NOTE — Discharge Instructions (Addendum)
Please read and follow all provided instructions.  You have been seen today for an injury to your right knee.   Tests performed today include: An x-ray of the affected area - does NOT show any broken bones or dislocations.  Vital signs. See below for your results today.   Home care instructions: -- *PRICE in the first 24-48 hours after injury: Protect (with brace, splint, sling), if given by your provider-please wear knee immobilizer until you have followed up with orthopedics or your primary care provider.  Use your crutches as needed, if you are comfortable putting weight on the foot you may do so. Rest Ice- Do not apply ice pack directly to your skin, place towel or similar between your skin and ice/ice pack. Apply ice for 20 min, then remove for 40 min while awake Compression- Wear brace, elastic bandage, splint as directed by your provider Elevate affected extremity above the level of your heart when not walking around for the first 24-48 hours   Medications:  Naproxen is a nonsteroidal anti-inflammatory medication that will help with pain and swelling. Be sure to take this medication as prescribed with food, 1 pill every 12 hours,  It should be taken with food, as it can cause stomach upset, and more seriously, stomach bleeding. Do not take other nonsteroidal anti-inflammatory medications with this such as Advil, Motrin, or Aleve.   We have prescribed you new medication(s) today. Discuss the medications prescribed today with your pharmacist as they can have adverse effects and interactions with your other medicines including over the counter and prescribed medications. Seek medical evaluation if you start to experience new or abnormal symptoms after taking one of these medicines, seek care immediately if you start to experience difficulty breathing, feeling of your throat closing, facial swelling, or rash as these could be indications of a more serious allergic reaction  May take Tylenol  with this medication safely per over-the-counter dosing instructions.  Follow-up instructions: Please follow-up with your primary care provider or the provided orthopedic physician (bone specialist) if you continue to have significant pain in 1 week. In this case you may have a more severe injury that requires further care.   Return instructions:  Please return if your toes or feet are numb or tingling, appear gray or blue, or you have severe pain (also elevate the leg and loosen splint or wrap if you were given one) Please return to the Emergency Department if you experience worsening symptoms.  Please return if you have any other emergent concerns. Additional Information:  Your vital signs today were: BP 123/71 (BP Location: Right Arm)    Temp 98.8 F (37.1 C) (Oral)    Resp 18    LMP 06/24/2017    SpO2 100%  If your blood pressure (BP) was elevated above 135/85 this visit, please have this repeated by your doctor within one month. ---------------

## 2017-10-29 NOTE — ED Notes (Signed)
Follow up call made,  Will return to ed if needed before able to see pcp  10/29/17  0851  s Qais Jowers rn

## 2017-10-29 NOTE — ED Notes (Signed)
Pt states that she is leaving due to wait times, explained to pt that she is next  pt refuses to wait

## 2017-10-29 NOTE — ED Triage Notes (Signed)
Pt states that she was spinning around and hurt her R knee after hearing a pop

## 2017-10-29 NOTE — ED Provider Notes (Signed)
[facility] EMERGENCY DEPARTMENT Provider Note   CSN: [id number] Arrival date & time: 10/29/17  1843     History   Chief Complaint Chief Complaint  Patient presents with  . Knee Pain    HPI Claudia Gonzales is a 27 y.o. female with history of anxiety, asthma, and prior tobacco abuse who presents to the emergency department status post right knee injury which occurred this afternoon complaining of pain.  Patient states that she was standing and twisted to the left and felt a popping sensation to the medial aspect of her right knee.  She did not fall to the ground.  No trauma.  She has had constant pain since this injury, pain a 6 out of 10 in severity, worse with movement, she has not been able to bear weight.  No alleviating factors.  No intervention prior to arrival.  States she had a similar injury a while ago which resolved without intervention, she was not evaluated for this.  Denies tingling, numbness, weakness, or prior surgical procedures on the knee.  Patient denies chance of pregnancy.  HPI  Past Medical History:  Diagnosis Date  . Anxiety   . Asthma   . Chlamydia   . Preterm labor     Patient Active Problem List   Diagnosis Date Noted  . Encounter for insertion of mirena IUD 10/03/2012  . Traumatic injury during pregnancy 07/21/2012  . Supervision of high risk pregnancy in third trimester 06/13/2012  . H/O premature delivery 05/02/2012  . Anxiety and depression 05/02/2012    Past Surgical History:  Procedure Laterality Date  . APPENDECTOMY    . WISDOM TOOTH EXTRACTION       OB History    Gravida  3   Para  2   Term  1   Preterm  1   AB      Living  2     SAB      TAB      Ectopic      Multiple      Live Births  2            Home Medications    Prior to Admission medications   Medication Sig Start Date End Date Taking? Authorizing Provider  methocarbamol (ROBAXIN) 500 MG tablet Take 1 tablet (500 mg total) by  mouth 2 (two) times daily. Patient not taking: Reported on 08/04/2017 06/26/17   Jacinto Halim, PA-C  ondansetron (ZOFRAN ODT) 4 MG disintegrating tablet Take 1 tablet (4 mg total) by mouth every 8 (eight) hours as needed for nausea or vomiting. 08/04/17   Roxy Horseman, PA-C    Family History Family History  Problem Relation Age of Onset  . Hypertension Mother   . Cancer Father        colon  . Cancer Maternal Grandmother   . Cancer Maternal Grandfather   . Cancer Paternal Grandmother   . Cancer Paternal Grandfather     Social History Social History   Tobacco Use  . Smoking status: Former Smoker    Packs/day: 0.50    Years: 1.00    Pack years: 0.50    Types: Cigarettes    Last attempt to quit: 06/17/2012    Years since quitting: 5.3  . Smokeless tobacco: Never Used  Substance Use Topics  . Alcohol use: Not Currently    Alcohol/week: 4.2 oz    Types: 7 Glasses of wine per week    Comment: glass of wine /day  .  Drug use: No     Allergies   Patient has no known allergies.   Review of Systems Review of Systems  Constitutional: Negative for chills and fever.  Musculoskeletal: Positive for arthralgias. Negative for joint swelling.  Skin: Negative for color change and wound.  Neurological: Negative for weakness and numbness.     Physical Exam Updated Vital Signs BP 112/70 (BP Location: Right Arm)   Pulse 73   Temp 98.8 F (37.1 C) (Oral)   Resp 20   LMP 06/24/2017   SpO2 100%   Physical Exam  Constitutional: She appears well-developed and well-nourished. No distress.  HENT:  Head: Normocephalic and atraumatic.  Eyes: Conjunctivae are normal. Right eye exhibits no discharge. Left eye exhibits no discharge.  Cardiovascular:  2+ symmetric DP/PT pulses.  Musculoskeletal:  Lower extreme is: No obvious deformity, appreciable swelling, erythema, ecchymosis, open wounds.  Patient has normal range of motion bilateral hips knees and ankles with the exception of  the right knee having some limitation in full flexion secondary to pain, she is able to flex past 90 degrees.  Patient is tender just medial to the patella extending to the medial joint line.  No other areas of tenderness.  Negative anterior drawer test.  Negative valgus/varus stress test.  Occult he performing McMurray's maneuver secondary to body habitus.  NVI distally.  Neurological: She is alert.  Clear speech.  Sensation grossly intact bilateral lower extremity's 5 out of 5 strength plantar dorsiflexion of the ankle, 5 out of 5 strength with flexion and extension at the knee bilaterally.  Patient will put some weight onto the right foot, however she is refusing to attempt ambulation.  Skin: Skin is warm and dry. Capillary refill takes less than 2 seconds.  Psychiatric: She has a normal mood and affect. Her behavior is normal. Thought content normal.  Nursing note and vitals reviewed.    ED Treatments / Results  Labs (all labs ordered are listed, but only abnormal results are displayed) Labs Reviewed - No data to display  EKG None  Radiology Dg Knee Complete 4 Views Right  Result Date: 10/29/2017 CLINICAL DATA:  Right knee pain after injury. EXAM: RIGHT KNEE - COMPLETE 4+ VIEW COMPARISON:  None. FINDINGS: No evidence of fracture, dislocation, or joint effusion. No evidence of arthropathy or other focal bone abnormality. Soft tissues are unremarkable. IMPRESSION: Normal right knee. Electronically Signed   By: Lupita Raider, M.D.   On: 10/29/2017 20:25    Procedures Procedures (including critical care time) .Splint Application Date/Time: 10/29/2017 11:08 PM Performed by: EDT Authorized by: Cherly Anderson, PA-C   Consent:    Consent obtained:  Verbal   Consent given by:  Patient   Risks discussed:  Discoloration, numbness, pain and swelling   Alternatives discussed:  No treatment Pre-procedure details:    Sensation:  Normal Procedure details:    Laterality:  Right    Location:  Knee   Splint type:  Knee immobilizer Post-procedure details:    Sensation:  Normal   Patient tolerance of procedure:  Tolerated well, no immediate complications  Medications Ordered in ED Medications  naproxen (NAPROSYN) tablet 500 mg (has no administration in time range)     Initial Impression / Assessment and Plan / ED Course  I have reviewed the triage vital signs and the nursing notes.  Pertinent labs & imaging results that were available during my care of the patient were reviewed by me and considered in my medical decision making (see  chart for details).   Patient presents to the emergency department complaining of right knee pain status post twisting/turning injury.  Patient nontoxic appearing, resting comfortably.  X-ray negative for fracture dislocation.  No obvious joint instability.  She is neurovascularly intact distally.  Query meniscal injury.  Will place patient in knee immobilizer, provide crutches, PRICE and naproxen with ortho follow up. I discussed results, treatment plan, need for follow-up, and return precautions with the patient. Provided opportunity for questions, patient confirmed understanding and is in agreement with plan.    Patient also requested dental resources information she had at a previous visit, no complaints at this time regarding this, just wants to have this available as needed, this was provided.   Final Clinical Impressions(s) / ED Diagnoses   Final diagnoses:  Acute pain of right knee    ED Discharge Orders        Ordered    naproxen (NAPROSYN) 375 MG tablet  2 times daily     10/29/17 2309       Moe Brier, Prospect HeightsSamantha R, PA-C 10/30/17 0016    Margarita Grizzleay, Danielle, MD 10/30/17 1521

## 2017-10-29 NOTE — ED Notes (Signed)
Pt reports hearing her right knee pop at about 1600 this date while spinning around.

## 2017-10-29 NOTE — ED Notes (Signed)
Pt educated crutch use and knee imobilizer placed to pt Rt, knee VS documented

## 2018-01-23 ENCOUNTER — Encounter: Payer: Self-pay | Admitting: Emergency Medicine

## 2018-01-23 ENCOUNTER — Emergency Department (HOSPITAL_COMMUNITY)
Admission: EM | Admit: 2018-01-23 | Discharge: 2018-01-23 | Disposition: A | Discharge status: Home / Self Care | Payer: Self-pay | Attending: Emergency Medicine | Admitting: Emergency Medicine

## 2018-01-23 ENCOUNTER — Emergency Department (HOSPITAL_COMMUNITY): Payer: Self-pay

## 2018-01-23 DIAGNOSIS — B9789 Other viral agents as the cause of diseases classified elsewhere: Secondary | ICD-10-CM

## 2018-01-23 DIAGNOSIS — Z87891 Personal history of nicotine dependence: Secondary | ICD-10-CM | POA: Insufficient documentation

## 2018-01-23 DIAGNOSIS — J45909 Unspecified asthma, uncomplicated: Secondary | ICD-10-CM | POA: Insufficient documentation

## 2018-01-23 DIAGNOSIS — Z79899 Other long term (current) drug therapy: Secondary | ICD-10-CM | POA: Insufficient documentation

## 2018-01-23 DIAGNOSIS — J069 Acute upper respiratory infection, unspecified: Secondary | ICD-10-CM | POA: Insufficient documentation

## 2018-01-23 LAB — COMPREHENSIVE METABOLIC PANEL
ALT: 20 U/L (ref 0–44)
AST: 19 U/L (ref 15–41)
Albumin: 3.3 g/dL — ABNORMAL LOW (ref 3.5–5.0)
Alkaline Phosphatase: 49 U/L (ref 38–126)
Anion gap: 6 (ref 5–15)
BUN: 9 mg/dL (ref 6–20)
CHLORIDE: 106 mmol/L (ref 98–111)
CO2: 23 mmol/L (ref 22–32)
Calcium: 9.2 mg/dL (ref 8.9–10.3)
Creatinine, Ser: 0.73 mg/dL (ref 0.44–1.00)
Glucose, Bld: 125 mg/dL — ABNORMAL HIGH (ref 70–99)
POTASSIUM: 3.8 mmol/L (ref 3.5–5.1)
Sodium: 135 mmol/L (ref 135–145)
TOTAL PROTEIN: 6.4 g/dL — AB (ref 6.5–8.1)
Total Bilirubin: 0.5 mg/dL (ref 0.3–1.2)

## 2018-01-23 LAB — TYPE AND SCREEN
ABO/RH(D): B POS
ANTIBODY SCREEN: NEGATIVE

## 2018-01-23 LAB — CBC
HCT: 38.5 % (ref 36.0–46.0)
HEMOGLOBIN: 12.3 g/dL (ref 12.0–15.0)
MCH: 28.3 pg (ref 26.0–34.0)
MCHC: 31.9 g/dL (ref 30.0–36.0)
MCV: 88.7 fL (ref 80.0–100.0)
NRBC: 0 % (ref 0.0–0.2)
PLATELETS: 244 10*3/uL (ref 150–400)
RBC: 4.34 MIL/uL (ref 3.87–5.11)
RDW: 13 % (ref 11.5–15.5)
WBC: 4.7 10*3/uL (ref 4.0–10.5)

## 2018-01-23 LAB — POC OCCULT BLOOD, ED: FECAL OCCULT BLD: NEGATIVE

## 2018-01-23 LAB — I-STAT BETA HCG BLOOD, ED (MC, WL, AP ONLY)

## 2018-01-23 LAB — ABO/RH: ABO/RH(D): B POS

## 2018-01-23 LAB — GROUP A STREP BY PCR: Group A Strep by PCR: NOT DETECTED

## 2018-01-23 MED ORDER — FLUTICASONE PROPIONATE 50 MCG/ACT NA SUSP: 1.0000 | Freq: Every day | NASAL | 2 refills | Status: DC

## 2018-01-23 MED ORDER — KETOROLAC TROMETHAMINE 60 MG/2ML IM SOLN
60.0000 mg | Freq: Once | INTRAMUSCULAR | Status: AC
  Administered 2018-01-23: 60 mg via INTRAMUSCULAR
  Filled 2018-01-23: qty 2

## 2018-01-23 MED ORDER — ALBUTEROL SULFATE HFA 108 (90 BASE) MCG/ACT IN AERS
1.0000 | INHALATION_SPRAY | Freq: Once | RESPIRATORY_TRACT | Status: AC
  Administered 2018-01-23: 1 via RESPIRATORY_TRACT
  Filled 2018-01-23: qty 6.7

## 2018-01-23 MED ORDER — BENZONATATE 100 MG PO CAPS: 100.0000 mg | ORAL_CAPSULE | Freq: Three times a day (TID) | ORAL | 0 refills | Status: DC

## 2018-01-23 NOTE — ED Triage Notes (Addendum)
Pt reports sob that she woke up with this am, endorses dry cough. resp e/u, nad. After triage complete, patient reports noticing dark red blood in her stools x1 week.

## 2018-01-23 NOTE — ED Notes (Signed)
Patient transported to X-ray 

## 2018-01-23 NOTE — ED Notes (Signed)
ED Provider at bedside. 

## 2018-01-23 NOTE — ED Notes (Signed)
Pt ambulated with pulse ox in hallway without assistance, steady gait. with no complaints. O2 remained at or above 96% for duration and pulse in 80s-90s.

## 2018-01-23 NOTE — Discharge Instructions (Signed)
Use Flonase as directed for nasal congestion.  Take Tessalon Perles for cough.  Use albuterol inhaler as directed.  Make sure you are staying hydrated drinking plenty of fluids.  Follow-up with the referred [facility] to establish primary care doctor.  Follow-up with referred GI doctor if you continue have bleeding with your stools.  Return the emergency department for any difficulty breathing, fevers, vomiting, persistent blood in your stool, episodes of passing out or any other worsening or concerning symptoms.

## 2018-01-23 NOTE — ED Provider Notes (Signed)
[facility] EMERGENCY DEPARTMENT Provider Note   CSN: [id number] Arrival date & time: 01/23/18  1405     History   Chief Complaint Chief Complaint  Patient presents with  . Shortness of Breath  . Rectal Bleeding    HPI Claudia Gonzales is a 27 y.o. female with PMH/o Anxiety, Asthma, Chlamydia, who presents for evaluation of SOB that began earlier today at approximately 12pm. Patient reports she woke up sleeping and was experiencing SOB. She has difficulty describing her symptoms but states she was also having some dry cough and sore throat at the time and states that she feels like she was trying to cough up something but it wouldn't come up. On ED arrival, she states that SOB has improved thought she still feel like it is not back to normal.  Patient states that she started experiencing sore throat and dry cough today also.  She states she had a little bit of nasal congestion rhinorrhea yesterday.  She states that she is not trying to eat or drink anything all day because she "was scared."  She states she has been able to tolerate her secretions without any difficulty.  She does report a history of asthma as a child but states she has not had any issues since being at all and currently does not have any inhalers that she uses.  Patient also reports that over the last 2 weeks, she has had blood in her stools and on her toilet paper.  She states that it is sometimes bright red or dark blood.  She denies any black or tarry stools.  She has noticed it almost every other bowel movement.  She does report that it feels sharp when she has a bowel movement but denies any straining.  She also reports she developed a generalized headache since being here in ED.  Denies any numbness/weakness of her arms or legs.  Patient is not a current smoker denies any vaping. She denies any OCP use, recent immobilization, prior history of DVT/PE, recent surgery, leg swelling, or long travel.  Patient  denies any chest pain, leg swelling, abdominal pain, nausea/vomiting.  The history is provided by the patient.    Past Medical History:  Diagnosis Date  . Anxiety   . Asthma   . Chlamydia   . Preterm labor     Patient Active Problem List   Diagnosis Date Noted  . Encounter for insertion of mirena IUD 10/03/2012  . Traumatic injury during pregnancy 07/21/2012  . Supervision of high risk pregnancy in third trimester 06/13/2012  . H/O premature delivery 05/02/2012  . Anxiety and depression 05/02/2012    Past Surgical History:  Procedure Laterality Date  . APPENDECTOMY    . WISDOM TOOTH EXTRACTION       OB History    Gravida  3   Para  2   Term  1   Preterm  1   AB      Living  2     SAB      TAB      Ectopic      Multiple      Live Births  2            Home Medications    Prior to Admission medications   Medication Sig Start Date End Date Taking? Authorizing Provider  Pseudoephedrine-APAP-DM (DAYQUIL MULTI-SYMPTOM COLD/FLU PO) Take 1 capsule by mouth as needed.   Yes [provider]  benzonatate (TESSALON) 100 MG  capsule Take 1 capsule (100 mg total) by mouth every 8 (eight) hours. 01/23/18   Maxwell Caul, PA-C  fluticasone (FLONASE) 50 MCG/ACT nasal spray Place 1 spray into both nostrils daily. 01/23/18   Maxwell Caul, PA-C  naproxen (NAPROSYN) 375 MG tablet Take 1 tablet (375 mg total) by mouth 2 (two) times daily. Patient not taking: Reported on 01/23/2018 10/29/17   Petrucelli, Samantha R, PA-C  ondansetron (ZOFRAN ODT) 4 MG disintegrating tablet Take 1 tablet (4 mg total) by mouth every 8 (eight) hours as needed for nausea or vomiting. Patient not taking: Reported on 01/23/2018 08/04/17   Roxy Horseman, PA-C    Family History Family History  Problem Relation Age of Onset  . Hypertension Mother   . Cancer Father        colon  . Cancer Maternal Grandmother   . Cancer Maternal Grandfather   . Cancer Paternal Grandmother    . Cancer Paternal Grandfather     Social History Social History   Tobacco Use  . Smoking status: Former Smoker    Packs/day: 0.50    Years: 1.00    Pack years: 0.50    Types: Cigarettes    Last attempt to quit: 06/17/2012    Years since quitting: 5.6  . Smokeless tobacco: Never Used  Substance Use Topics  . Alcohol use: Not Currently    Alcohol/week: 7.0 standard drinks    Types: 7 Glasses of wine per week    Comment: glass of wine /day  . Drug use: No     Allergies   Patient has no known allergies.   Review of Systems Review of Systems  Constitutional: Negative for fever.  HENT: Positive for congestion and sore throat. Negative for drooling and trouble swallowing.   Respiratory: Positive for cough and shortness of breath.   Cardiovascular: Negative for chest pain.  Gastrointestinal: Negative for abdominal pain, nausea and vomiting.  Genitourinary: Negative for dysuria and hematuria.  Neurological: Positive for headaches. Negative for weakness and numbness.  All other systems reviewed and are negative.    Physical Exam Updated Vital Signs BP 112/76 (BP Location: Right Arm)   Pulse 65   Temp 98.7 F (37.1 C) (Oral)   Resp 16   LMP 11/17/2017 (Approximate)   SpO2 100%   Breastfeeding? Unknown   Physical Exam  Constitutional: She is oriented to person, place, and time. She appears well-developed and well-nourished.  Sitting comfortably on examination table using phone  HENT:  Head: Normocephalic and atraumatic.  Mouth/Throat: Uvula is midline and mucous membranes are normal. No trismus in the jaw. Posterior oropharyngeal erythema present.  Posterior pharynx is IMA or exudates.  There is some slight erythema.  Uvula is midline.  No trismus.  Airways patent, phonation is intact.  Eyes: Pupils are equal, round, and reactive to light. Conjunctivae, EOM and lids are normal.  Neck: Full passive range of motion without pain.  Cardiovascular: Normal rate, regular  rhythm, normal heart sounds and normal pulses. Exam reveals no gallop and no friction rub.  No murmur heard. Pulmonary/Chest: Effort normal and breath sounds normal.  Lungs clear to auscultation bilaterally.  Symmetric chest rise.  No wheezing, rales, rhonchi.  Able to speak in full sentences without any difficulty.  No evidence of respiratory distress.   Abdominal: Soft. Normal appearance. There is no tenderness. There is no rigidity and no guarding.  Genitourinary: Rectal exam shows external hemorrhoid. Rectal exam shows no fissure and guaiac negative stool.  Genitourinary Comments:  The exam was performed with a chaperone present. Digital Rectal Exam reveals sphincter with good tone. Small, non thrombosed external hemorrhoid noted at the 11 o'clock region. No masses or fissures. Stool color is brown with no overt blood.  Musculoskeletal: Normal range of motion.  Neurological: She is alert and oriented to person, place, and time.  Follows commands, Moves all extremities  5/5 strength to BUE and BLE  Sensation intact throughout all major nerve distributions  Skin: Skin is warm and dry. Capillary refill takes less than 2 seconds.  Psychiatric: She has a normal mood and affect. Her speech is normal.  Nursing note and vitals reviewed.    ED Treatments / Results  Labs (all labs ordered are listed, but only abnormal results are displayed) Labs Reviewed  COMPREHENSIVE METABOLIC PANEL - Abnormal; Notable for the following components:      Result Value   Glucose, Bld 125 (*)    Total Protein 6.4 (*)    Albumin 3.3 (*)    All other components within normal limits  GROUP A STREP BY PCR  CBC  POC OCCULT BLOOD, ED  I-STAT BETA HCG BLOOD, ED ([facility], [facility], [facility] ONLY)  TYPE AND SCREEN  ABO/RH    EKG EKG Interpretation  Date/Time:  Sunday January 23 2018 14:09:58 EDT Ventricular Rate:  84 PR Interval:  144 QRS Duration: 80 QT Interval:  374 QTC Calculation: 441 R Axis:   22 Text  Interpretation:  Normal sinus rhythm Normal ECG Confirmed by Rees, Elizabeth ([id number]), editor Belcher, Jessica ([id number]) on 01/23/2018 2:38:54 PM   Radiology Dg Chest 2 View  Result Date: 01/23/2018 CLINICAL DATA:  27 year old female with dry cough EXAM: CHEST - 2 VIEW COMPARISON:  06/26/2017 FINDINGS: The heart size and mediastinal contours are within normal limits. Both lungs are clear. The visualized skeletal structures are unremarkable. IMPRESSION: Negative for acute cardiopulmonary disease Electronically Signed   By: Jaime  Wagner D.O.   On: 01/23/2018 15:58    Procedures Procedures (including critical care time)  Medications Ordered in ED Medications  ketorolac (TORADOL) injection 60 mg (60 mg Intramuscular Given 01/23/18 1543)  albuterol (PROVENTIL HFA;VENTOLIN HFA) 108 (90 Base) MCG/ACT inhaler 1 puff (1 puff Inhalation Provided for home use 01/23/18 1635)     Initial Impression / Assessment and Plan / ED Course  I have reviewed the triage vital signs and the nursing notes.  Pertinent labs & imaging results that were available during my care of the patient were reviewed by me and considered in my medical decision making (see chart for details).     27  year old female who presents for evaluation of shortness of breath, cough, nasal congestion, sore throat that began today.  Reports that when she woke up this morning, she was coughing and felt like she was having difficulty breathing.  She has difficulty describing her symptoms.  She does report improvement since being here in the ED.  History of asthma as a child but no issues as an adult. Patient is afebrile, non-toxic appearing, sitting comfortably on examination table. Vital signs reviewed and stable.  Consider upper respiratory infection versus pneumonia though low suspicion given history/physical exam.  Also consider pharyngitis versus postnasal drip.  History/physical exam not concerning for peritonsillar abscess, Ludwig angina.  Patient is PERC negative and is low risk for PE.  Additionally, given patient's history, it sounds more like either an anal fissure or external hemorrhoid that is causing her bleeding.  Digital rectal exam reveals no gross melena.  She does  have a small external hemorrhoid.  No obvious anal fissure.  I suspect that this is where the bleeding is coming from.  Do not suspect GI bleed at this time.  I-STAT beta negative.  CBC shows no leukocytosis, normal hemoglobin, hematocrit.  CMP shows glucose of 125.  No other acute abnormalities. Fecal occult is negative.  Chest x-ray negative for any acute infectious etiology.  Reevaluation.  Patient reports improvement in sore throat after analgesics.  She has tolerated p.o. in the department any difficulty.  Patient ambulated in the ED with O2 sats greater than 96% on room air.  No evidence of respiratory distress.  Patient reports feeling better.  Given patient's history of asthma as a child, will send her home with an inhaler for bronchospasms.  Additionally, encourage at home supportive care measures.  We will plan to give her outpatient GI referral for continued issues with rectal bleeding.  At this time, do not suspect GI bleed as the cause of patient's symptoms. Patient had ample opportunity for questions and discussion. All patient's questions were answered with full understanding. Strict return precautions discussed. Patient expresses understanding and agreement to plan.    Final Clinical Impressions(s) / ED Diagnoses   Final diagnoses:  Viral URI with cough    ED Discharge Orders         Ordered    fluticasone (FLONASE) 50 MCG/ACT nasal spray  Daily     01/23/18 1629    benzonatate (TESSALON) 100 MG capsule  Every 8 hours     01/23/18 1629           Maxwell Caul, PA-C 01/23/18 1644    Melene Plan, DO 01/24/18 0009

## 2018-04-20 ENCOUNTER — Encounter (HOSPITAL_COMMUNITY): Payer: Self-pay

## 2018-04-20 ENCOUNTER — Inpatient Hospital Stay (HOSPITAL_COMMUNITY)
Admission: EM | Admit: 2018-04-20 | Discharge: 2018-04-24 | DRG: 868 | Disposition: A | Discharge status: Home / Self Care | Payer: Self-pay | Attending: Oncology | Admitting: Oncology

## 2018-04-20 ENCOUNTER — Emergency Department (HOSPITAL_COMMUNITY): Payer: Self-pay

## 2018-04-20 ENCOUNTER — Other Ambulatory Visit: Payer: Self-pay

## 2018-04-20 DIAGNOSIS — R42 Dizziness and giddiness: Secondary | ICD-10-CM | POA: Diagnosis present

## 2018-04-20 DIAGNOSIS — Z202 Contact with and (suspected) exposure to infections with a predominantly sexual mode of transmission: Secondary | ICD-10-CM

## 2018-04-20 DIAGNOSIS — R200 Anesthesia of skin: Secondary | ICD-10-CM | POA: Diagnosis present

## 2018-04-20 DIAGNOSIS — R509 Fever, unspecified: Secondary | ICD-10-CM | POA: Diagnosis present

## 2018-04-20 DIAGNOSIS — J45909 Unspecified asthma, uncomplicated: Secondary | ICD-10-CM | POA: Diagnosis present

## 2018-04-20 DIAGNOSIS — F419 Anxiety disorder, unspecified: Secondary | ICD-10-CM | POA: Diagnosis present

## 2018-04-20 DIAGNOSIS — A5486 Gonococcal sepsis: Secondary | ICD-10-CM | POA: Diagnosis present

## 2018-04-20 DIAGNOSIS — M25572 Pain in left ankle and joints of left foot: Secondary | ICD-10-CM | POA: Diagnosis present

## 2018-04-20 DIAGNOSIS — L988 Other specified disorders of the skin and subcutaneous tissue: Secondary | ICD-10-CM

## 2018-04-20 DIAGNOSIS — Z6841 Body Mass Index (BMI) 40.0 and over, adult: Secondary | ICD-10-CM

## 2018-04-20 DIAGNOSIS — A64 Unspecified sexually transmitted disease: Secondary | ICD-10-CM | POA: Diagnosis present

## 2018-04-20 DIAGNOSIS — M25561 Pain in right knee: Secondary | ICD-10-CM | POA: Diagnosis present

## 2018-04-20 DIAGNOSIS — R262 Difficulty in walking, not elsewhere classified: Secondary | ICD-10-CM | POA: Diagnosis present

## 2018-04-20 DIAGNOSIS — S60421A Blister (nonthermal) of left index finger, initial encounter: Secondary | ICD-10-CM | POA: Diagnosis present

## 2018-04-20 DIAGNOSIS — M25562 Pain in left knee: Secondary | ICD-10-CM

## 2018-04-20 DIAGNOSIS — M25552 Pain in left hip: Secondary | ICD-10-CM | POA: Diagnosis present

## 2018-04-20 DIAGNOSIS — X58XXXA Exposure to other specified factors, initial encounter: Secondary | ICD-10-CM | POA: Diagnosis present

## 2018-04-20 DIAGNOSIS — M2550 Pain in unspecified joint: Secondary | ICD-10-CM

## 2018-04-20 DIAGNOSIS — E669 Obesity, unspecified: Secondary | ICD-10-CM | POA: Diagnosis present

## 2018-04-20 DIAGNOSIS — Z91410 Personal history of adult physical and sexual abuse: Secondary | ICD-10-CM

## 2018-04-20 DIAGNOSIS — R042 Hemoptysis: Secondary | ICD-10-CM | POA: Diagnosis present

## 2018-04-20 DIAGNOSIS — N760 Acute vaginitis: Secondary | ICD-10-CM | POA: Diagnosis present

## 2018-04-20 DIAGNOSIS — A549 Gonococcal infection, unspecified: Principal | ICD-10-CM | POA: Diagnosis present

## 2018-04-20 DIAGNOSIS — Z809 Family history of malignant neoplasm, unspecified: Secondary | ICD-10-CM

## 2018-04-20 DIAGNOSIS — Z8249 Family history of ischemic heart disease and other diseases of the circulatory system: Secondary | ICD-10-CM

## 2018-04-20 DIAGNOSIS — B9689 Other specified bacterial agents as the cause of diseases classified elsewhere: Secondary | ICD-10-CM

## 2018-04-20 DIAGNOSIS — Z87891 Personal history of nicotine dependence: Secondary | ICD-10-CM

## 2018-04-20 HISTORY — DX: Major depressive disorder, single episode, unspecified: F32.9

## 2018-04-20 HISTORY — DX: Pain in unspecified joint: M25.50

## 2018-04-20 HISTORY — DX: Depression, unspecified: F32.A

## 2018-04-20 LAB — CBC WITH DIFFERENTIAL/PLATELET
Abs Immature Granulocytes: 0.03 10*3/uL (ref 0.00–0.07)
BASOS PCT: 0 %
Basophils Absolute: 0 10*3/uL (ref 0.0–0.1)
EOS ABS: 0.1 10*3/uL (ref 0.0–0.5)
EOS PCT: 1 %
HCT: 37 % (ref 36.0–46.0)
Hemoglobin: 12.1 g/dL (ref 12.0–15.0)
Immature Granulocytes: 0 %
Lymphocytes Relative: 21 %
Lymphs Abs: 2 10*3/uL (ref 0.7–4.0)
MCH: 29.2 pg (ref 26.0–34.0)
MCHC: 32.7 g/dL (ref 30.0–36.0)
MCV: 89.2 fL (ref 80.0–100.0)
MONO ABS: 0.5 10*3/uL (ref 0.1–1.0)
MONOS PCT: 5 %
NEUTROS ABS: 6.7 10*3/uL (ref 1.7–7.7)
Neutrophils Relative %: 73 %
PLATELETS: 274 10*3/uL (ref 150–400)
RBC: 4.15 MIL/uL (ref 3.87–5.11)
RDW: 13 % (ref 11.5–15.5)
WBC: 9.3 10*3/uL (ref 4.0–10.5)
nRBC: 0 % (ref 0.0–0.2)

## 2018-04-20 LAB — COMPREHENSIVE METABOLIC PANEL
ALT: 18 U/L (ref 0–44)
ANION GAP: 11 (ref 5–15)
AST: 15 U/L (ref 15–41)
Albumin: 3.7 g/dL (ref 3.5–5.0)
Alkaline Phosphatase: 58 U/L (ref 38–126)
BUN: 14 mg/dL (ref 6–20)
CALCIUM: 9.3 mg/dL (ref 8.9–10.3)
CHLORIDE: 102 mmol/L (ref 98–111)
CO2: 24 mmol/L (ref 22–32)
CREATININE: 0.77 mg/dL (ref 0.44–1.00)
GFR calc non Af Amer: >60 mL/min (ref >60)
Glucose, Bld: 93 mg/dL (ref 70–99)
Potassium: 4 mmol/L (ref 3.5–5.1)
SODIUM: 137 mmol/L (ref 135–145)
Total Bilirubin: 0.7 mg/dL (ref 0.3–1.2)
Total Protein: 7.9 g/dL (ref 6.5–8.1)

## 2018-04-20 LAB — GC/CHLAMYDIA PROBE AMP (~~LOC~~) NOT AT ARMC
CHLAMYDIA, DNA PROBE: NEGATIVE
Neisseria Gonorrhea: POSITIVE — AB

## 2018-04-20 LAB — URINALYSIS, ROUTINE W REFLEX MICROSCOPIC
Bacteria, UA: NONE SEEN
Bilirubin Urine: NEGATIVE
Glucose, UA: NEGATIVE mg/dL
KETONES UR: 5 mg/dL — AB
LEUKOCYTES UA: NEGATIVE
Nitrite: NEGATIVE
PH: 5 (ref 5.0–8.0)
PROTEIN: NEGATIVE mg/dL
Specific Gravity, Urine: 1.033 — ABNORMAL HIGH (ref 1.005–1.030)

## 2018-04-20 LAB — WET PREP, GENITAL
SPERM: NONE SEEN
TRICH WET PREP: NONE SEEN
YEAST WET PREP: NONE SEEN

## 2018-04-20 LAB — I-STAT BETA HCG BLOOD, ED (MC, WL, AP ONLY): I-stat hCG, quantitative: <5 m[IU]/mL (ref <5)

## 2018-04-20 LAB — HIV ANTIBODY (ROUTINE TESTING W REFLEX): HIV SCREEN 4TH GENERATION: NONREACTIVE

## 2018-04-20 LAB — RPR: RPR Ser Ql: NONREACTIVE

## 2018-04-20 LAB — C-REACTIVE PROTEIN: CRP: 2 mg/dL — AB (ref <1.0)

## 2018-04-20 LAB — GROUP A STREP BY PCR: GROUP A STREP BY PCR: NOT DETECTED

## 2018-04-20 LAB — MONONUCLEOSIS SCREEN: Mono Screen: POSITIVE — AB

## 2018-04-20 LAB — SEDIMENTATION RATE: Sed Rate: 56 mm/hr — ABNORMAL HIGH (ref 0–22)

## 2018-04-20 MED ORDER — ACETAMINOPHEN 650 MG RE SUPP: 650.0000 mg | Freq: Four times a day (QID) | RECTAL | Status: DC | PRN

## 2018-04-20 MED ORDER — ONDANSETRON HCL 4 MG/2ML IJ SOLN
4.0000 mg | Freq: Four times a day (QID) | INTRAMUSCULAR | Status: DC | PRN
  Filled 2018-04-20: qty 2

## 2018-04-20 MED ORDER — ACETAMINOPHEN 325 MG PO TABS
650.0000 mg | ORAL_TABLET | Freq: Four times a day (QID) | ORAL | Status: DC | PRN
  Administered 2018-04-20: 650 mg via ORAL
  Filled 2018-04-20: qty 2

## 2018-04-20 MED ORDER — SODIUM CHLORIDE 0.9 % IV SOLN
1.0000 g | INTRAVENOUS | Status: AC
  Administered 2018-04-21 – 2018-04-24 (×4): 1 g via INTRAVENOUS
  Filled 2018-04-20 (×4): qty 10

## 2018-04-20 MED ORDER — AZITHROMYCIN 250 MG PO TABS
1000.0000 mg | ORAL_TABLET | Freq: Once | ORAL | Status: AC
  Administered 2018-04-20: 1000 mg via ORAL
  Filled 2018-04-20: qty 4

## 2018-04-20 MED ORDER — KETOROLAC TROMETHAMINE 30 MG/ML IJ SOLN
15.0000 mg | Freq: Four times a day (QID) | INTRAMUSCULAR | Status: AC | PRN
  Administered 2018-04-20 – 2018-04-21 (×2): 15 mg via INTRAVENOUS
  Filled 2018-04-20 (×2): qty 1

## 2018-04-20 MED ORDER — SODIUM CHLORIDE 0.9 % IV SOLN
1.0000 g | Freq: Once | INTRAVENOUS | Status: AC
  Administered 2018-04-20: 1 g via INTRAVENOUS
  Filled 2018-04-20: qty 10

## 2018-04-20 MED ORDER — SENNOSIDES-DOCUSATE SODIUM 8.6-50 MG PO TABS: 1.0000 | ORAL_TABLET | Freq: Every evening | ORAL | Status: DC | PRN

## 2018-04-20 MED ORDER — MORPHINE SULFATE (PF) 4 MG/ML IV SOLN
4.0000 mg | Freq: Once | INTRAVENOUS | Status: AC
  Administered 2018-04-20: 4 mg via INTRAVENOUS
  Filled 2018-04-20: qty 1

## 2018-04-20 MED ORDER — ENOXAPARIN SODIUM 40 MG/0.4ML ~~LOC~~ SOLN
40.0000 mg | SUBCUTANEOUS | Status: DC
  Administered 2018-04-21 – 2018-04-22 (×2): 40 mg via SUBCUTANEOUS
  Filled 2018-04-20 (×5): qty 0.4

## 2018-04-20 MED ORDER — ONDANSETRON HCL 4 MG/2ML IJ SOLN
4.0000 mg | Freq: Once | INTRAMUSCULAR | Status: AC
  Administered 2018-04-20: 4 mg via INTRAVENOUS
  Filled 2018-04-20: qty 2

## 2018-04-20 MED ORDER — SODIUM CHLORIDE 0.9 % IV BOLUS
1000.0000 mL | Freq: Once | INTRAVENOUS | Status: AC
  Administered 2018-04-20: 1000 mL via INTRAVENOUS

## 2018-04-20 MED ORDER — ACETAMINOPHEN 325 MG PO TABS
650.0000 mg | ORAL_TABLET | Freq: Once | ORAL | Status: AC
  Administered 2018-04-20: 650 mg via ORAL
  Filled 2018-04-20: qty 2

## 2018-04-20 MED ORDER — ALBUTEROL SULFATE (2.5 MG/3ML) 0.083% IN NEBU: 3.0000 mL | INHALATION_SOLUTION | Freq: Four times a day (QID) | RESPIRATORY_TRACT | Status: DC | PRN

## 2018-04-20 NOTE — ED Notes (Signed)
Mia PA at bedside  

## 2018-04-20 NOTE — ED Notes (Signed)
Pt states that she feels better since getting the zofran.

## 2018-04-20 NOTE — ED Provider Notes (Signed)
F/u on CRP and SED RATE.  Concern of disseminated Gonorrhea.    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  Patient voiced understanding and agrees with plan.  BP 114/62   Pulse 94   Temp 97.8 F (36.6 C) (Oral)   Resp 18   SpO2 100%   Results for orders placed or performed during the hospital encounter of 04/20/18  Group A Strep by PCR  Result Value Ref Range   Group A Strep by PCR NOT DETECTED NOT DETECTED  Wet prep, genital  Result Value Ref Range   Yeast Wet Prep HPF POC NONE SEEN NONE SEEN   Trich, Wet Prep NONE SEEN NONE SEEN   Clue Cells Wet Prep HPF POC PRESENT (A) NONE SEEN   WBC, Wet Prep HPF POC MANY (A) NONE SEEN   Sperm NONE SEEN   Sedimentation rate  Result Value Ref Range   Sed Rate 56 (H) 0 - 22 mm/hr  C-reactive protein  Result Value Ref Range   CRP 2.0 (H) <1.0 mg/dL  Urinalysis, Routine w reflex microscopic  Result Value Ref Range   Color, Urine YELLOW YELLOW   APPearance HAZY (A) CLEAR   Specific Gravity, Urine 1.033 (H) 1.005 - 1.030   pH 5.0 5.0 - 8.0   Glucose, UA NEGATIVE NEGATIVE mg/dL   Hgb urine dipstick SMALL (A) NEGATIVE   Bilirubin Urine NEGATIVE NEGATIVE   Ketones, ur 5 (A) NEGATIVE mg/dL   Protein, ur NEGATIVE NEGATIVE mg/dL   Nitrite NEGATIVE NEGATIVE   Leukocytes, UA NEGATIVE NEGATIVE   RBC / HPF 0-5 0 - 5 RBC/hpf   WBC, UA 0-5 0 - 5 WBC/hpf   Bacteria, UA NONE SEEN NONE SEEN   Squamous Epithelial / LPF 0-5 0 - 5   Mucus PRESENT   CBC with Differential  Result Value Ref Range   WBC 9.3 4.0 - 10.5 K/uL   RBC 4.15 3.87 - 5.11 MIL/uL   Hemoglobin 12.1 12.0 - 15.0 g/dL   HCT 82.937.0 56.236.0 - 13.046.0 %   MCV 89.2 80.0 - 100.0 fL   MCH 29.2 26.0 - 34.0 pg   MCHC 32.7 30.0 - 36.0 g/dL   RDW 86.513.0 78.411.5 - 69.615.5 %   Platelets 274 150 - 400 K/uL   nRBC 0.0  0.0 - 0.2 %   Neutrophils Relative % 73 %   Neutro Abs 6.7 1.7 - 7.7 K/uL   Lymphocytes Relative 21 %   Lymphs Abs 2.0 0.7 - 4.0 K/uL   Monocytes Relative 5 %   Monocytes Absolute 0.5 0.1 - 1.0 K/uL   Eosinophils Relative 1 %   Eosinophils Absolute 0.1 0.0 - 0.5 K/uL   Basophils Relative 0 %   Basophils Absolute 0.0 0.0 - 0.1 K/uL   Immature Granulocytes 0 %   Abs Immature Granulocytes 0.03 0.00 - 0.07 K/uL  Comprehensive metabolic panel  Result Value Ref Range   Sodium 137 135 - 145 mmol/L   Potassium 4.0 3.5 - 5.1 mmol/L   Chloride 102 98 - 111 mmol/L   CO2 24 22 - 32 mmol/L   Glucose, Bld 93 70 - 99 mg/dL   BUN 14 6 - 20 mg/dL   Creatinine, Ser 2.950.77 0.44 - 1.00 mg/dL   Calcium 9.3 8.9 - 28.410.3 mg/dL   Total Protein 7.9 6.5 - 8.1 g/dL  Albumin 3.7 3.5 - 5.0 g/dL   AST 15 15 - 41 U/L   ALT 18 0 - 44 U/L   Alkaline Phosphatase 58 38 - 126 U/L   Total Bilirubin 0.7 0.3 - 1.2 mg/dL   GFR calc non Af Amer >60 >60 mL/min   GFR calc Af Amer >60 >60 mL/min   Anion gap 11 5 - 15  I-Stat Beta hCG blood, ED ([facility], [facility], [facility] only)  Result Value Ref Range   I-stat hCG, quantitative <5.0 <5 mIU/mL   Comment 3           Dg Chest 2 View  Result Date: 04/20/2018 CLINICAL DATA:  Cough and sore throat. EXAM: CHEST - 2 VIEW COMPARISON:  01/23/2018 FINDINGS: The heart size and mediastinal contours are within normal limits. Both lungs are clear. The visualized skeletal structures are unremarkable. IMPRESSION: No active cardiopulmonary disease. Electronically Signed   By: Deatra Robinson M.D.   On: 04/20/2018 06:11        Fayrene Helper, PA-C 04/20/18 6712    Gilda Crease, MD 04/27/18 [phone]

## 2018-04-20 NOTE — ED Notes (Signed)
Attempted to collect culture. Unsuccessful.

## 2018-04-20 NOTE — ED Provider Notes (Signed)
[facility] Provider Note   CSN: [id number] Arrival date & time: 04/20/18  0042     History   Chief Complaint Chief Complaint  Patient presents with  . Joint Swelling  . Influenza    HPI Claudia Gonzales is a 28 y.o. female with history of chlamydia, asthma, and anxiety who presents to the emergency department with a chief complaint of left ankle pain.  The patient endorses multiple joints with pain and swelling over the last week.  Her symptoms started with her left index finger 1 week ago. She developed a "bump" on the finger that she popped and it drained clear fluid and then appeared to develop a small blood blister.  She reports that she then developed pain in her right knee.  She also notes that she had intermittent pain in her left hip and right groin for several days over the last week that have since resolved.  She characterizes pain as "feeling like I received a Depo shot." She reports that she came to the ED for evaluation tonight after she developed significant pain and swelling to her left ankle.  She reports she tried to squeeze the area on her left ankle, but was unable to get any fluid to drain out of it.  She reports she has developed some associated numbness in her toes as the swelling has worsened to the ankle.  She also reports she has been having intermittent hemoptysis over the last week.  States she has been having one episode almost every other day with mild shortness of breath.  She also reports that she has been having a headache, dizziness, and a sore throat.  She reports the headache and dizziness began over the last 3 days.  She reports that she has had been unsteady on her feet since the dizziness began.  She reports she did have an episode of malodorous, yellowish-white vaginal discharge lined with blood several days ago and notes that she has been having intermittent episodes of this over the last few months.  She has not  had a menstrual cycle since August.  She started taking OCPs in July, but discontinued the medication after 1 month.  She reports she was supposed to start having a period in August, but has not had a period in the last 6 months.  Reports she has not had sexual intercourse since November 2019.  She denies chills, chest pain, abdominal pain, nausea, vomiting, diarrhea, back pain, dysuria, urinary frequency or hesitancy, constipation, lightheadedness, or tinnitus.   The history is provided by the patient. No language interpreter was used.    Past Medical History:  Diagnosis Date  . Anxiety   . Asthma   . Chlamydia   . Depression   . Polyarthralgia 04/20/2018  . Preterm labor 2011    Patient Active Problem List   Diagnosis Date Noted  . Polyarthralgia 04/20/2018  . Encounter for insertion of mirena IUD 10/03/2012  . Traumatic injury during pregnancy 07/21/2012  . Supervision of high risk pregnancy in third trimester 06/13/2012  . H/O premature delivery 05/02/2012  . Anxiety and depression 05/02/2012    Past Surgical History:  Procedure Laterality Date  . APPENDECTOMY    . WISDOM TOOTH EXTRACTION       OB History    Gravida  3   Para  2   Term  1   Preterm  1   AB      Living  2  SAB      TAB      Ectopic      Multiple      Live Births  2            Home Medications    Prior to Admission medications   Medication Sig Start Date End Date Taking? Authorizing Provider  fluticasone (FLONASE) 50 MCG/ACT nasal spray Place 1 spray into both nostrils daily. Patient taking differently: Place 1 spray into both nostrils daily as needed for allergies or rhinitis.  01/23/18  Yes Maxwell Caul, PA-C  benzonatate (TESSALON) 100 MG capsule Take 1 capsule (100 mg total) by mouth every 8 (eight) hours. Patient not taking: Reported on 04/20/2018 01/23/18   Graciella Freer A, PA-C  naproxen (NAPROSYN) 375 MG tablet Take 1 tablet (375 mg total) by mouth 2 (two)  times daily. Patient not taking: Reported on 01/23/2018 10/29/17   Petrucelli, Samantha R, PA-C  ondansetron (ZOFRAN ODT) 4 MG disintegrating tablet Take 1 tablet (4 mg total) by mouth every 8 (eight) hours as needed for nausea or vomiting. Patient not taking: Reported on 01/23/2018 08/04/17   Roxy Horseman, PA-C    Family History Family History  Problem Relation Age of Onset  . Hypertension Mother   . Cancer Father        colon  . Cancer Maternal Grandmother   . Cancer Maternal Grandfather   . Cancer Paternal Grandmother   . Cancer Paternal Grandfather     Social History Social History   Tobacco Use  . Smoking status: Former Smoker    Packs/day: 0.25    Years: 1.00    Pack years: 0.25    Types: Cigarettes    Last attempt to quit: 06/17/2012    Years since quitting: 5.8  . Smokeless tobacco: Never Used  Substance Use Topics  . Alcohol use: Not Currently    Comment: 04/20/2018 "glass of wine qd til ~ 1 yr ago; now might have a glass of wine on major holidays"  . Drug use: Not Currently    Types: Marijuana    Comment: 04/20/2018 "nothing since ~ 2016"     Allergies   Patient has no known allergies.   Review of Systems Review of Systems  Constitutional: Negative for activity change and chills.  HENT: Negative for congestion, nosebleeds, sinus pressure, sinus pain and sore throat.   Respiratory: Positive for cough and shortness of breath. Negative for wheezing.        Hemoptysis   Cardiovascular: Negative for chest pain, palpitations and leg swelling.  Gastrointestinal: Negative for abdominal pain, blood in stool, diarrhea, nausea and vomiting.  Genitourinary: Positive for vaginal bleeding and vaginal discharge. Negative for dysuria, flank pain, hematuria and urgency.  Musculoskeletal: Positive for arthralgias, gait problem, joint swelling and myalgias. Negative for back pain, neck pain and neck stiffness.  Skin: Positive for color change, rash and wound.    Allergic/Immunologic: Negative for immunocompromised state.  Neurological: Positive for dizziness and headaches. Negative for seizures, syncope, weakness and numbness.  Psychiatric/Behavioral: Negative for confusion.     Physical Exam Updated Vital Signs BP 112/62 (BP Location: Right Arm)   Pulse 66   Temp 98.3 F (36.8 C) (Oral)   Resp 14   SpO2 100%   Physical Exam Vitals signs and nursing note reviewed.  Constitutional:      General: She is not in acute distress.    Appearance: She is not diaphoretic.     Comments: Uncomfortable appearing  HENT:  Head: Normocephalic.     Right Ear: Hearing, ear canal and external ear normal. No mastoid tenderness. Tympanic membrane is bulging.     Left Ear: Hearing, ear canal and external ear normal. No mastoid tenderness. Tympanic membrane is bulging.     Nose: Nose normal.     Right Sinus: No maxillary sinus tenderness or frontal sinus tenderness.     Left Sinus: No maxillary sinus tenderness or frontal sinus tenderness.     Mouth/Throat:     Mouth: Mucous membranes are moist.     Pharynx: Uvula midline. Posterior oropharyngeal erythema present. No oropharyngeal exudate.     Tonsils: No tonsillar exudate or tonsillar abscesses. Swelling: 3+ on the right. 3+ on the left.  Eyes:     General:        Right eye: No discharge.        Left eye: No discharge.     Extraocular Movements: Extraocular movements intact.     Conjunctiva/sclera: Conjunctivae normal.     Pupils: Pupils are equal, round, and reactive to light.  Neck:     Musculoskeletal: Neck supple.  Cardiovascular:     Rate and Rhythm: Normal rate and regular rhythm.     Pulses: Normal pulses.     Heart sounds: Normal heart sounds. No murmur. No friction rub. No gallop.   Pulmonary:     Effort: Pulmonary effort is normal. No respiratory distress.     Breath sounds: No stridor. No wheezing, rhonchi or rales.  Chest:     Chest wall: No tenderness.  Abdominal:     General:  There is no distension.     Palpations: Abdomen is soft. There is no mass.     Tenderness: There is no abdominal tenderness. There is no right CVA tenderness, left CVA tenderness, guarding or rebound.     Hernia: No hernia is present.  Genitourinary:    Comments: Copious amounts of purulent, malodorous whitish/yellow discharge.  She has no cervical motion tenderness.  No adnexal tenderness or masses bilaterally.  Musculoskeletal:     Comments: Tender to palpation to the left index PIP joint.  There is a 2 mm erythematous circular lesion to the skin overlying the dorsum of the joint.  No drainage.  Reports the lesion contained clear fluid.  Tender to palpation to the right knee.  Tenderness is maximal at lateral proximal tibia.  There is a small, tender nodule with overlying erythema overlying the tender area.  Tender to palpation to the lateral aspect of the left ankle.  There is an overlying indurated lesion to the lateral malleolus that  Skin:    General: Skin is warm.     Capillary Refill: Capillary refill takes less than 2 seconds.     Findings: No rash.  Neurological:     Mental Status: She is alert.  Psychiatric:        Behavior: Behavior normal.      ED Treatments / Results  Labs (all labs ordered are listed, but only abnormal results are displayed) Labs Reviewed  WET PREP, GENITAL - Abnormal; Notable for the following components:      Result Value   Clue Cells Wet Prep HPF POC PRESENT (*)    WBC, Wet Prep HPF POC MANY (*)    All other components within normal limits  SEDIMENTATION RATE - Abnormal; Notable for the following components:   Sed Rate 56 (*)    All other components within normal limits  C-REACTIVE PROTEIN - Abnormal;  Notable for the following components:   CRP 2.0 (*)    All other components within normal limits  URINALYSIS, ROUTINE W REFLEX MICROSCOPIC - Abnormal; Notable for the following components:   APPearance HAZY (*)    Specific Gravity, Urine 1.033  (*)    Hgb urine dipstick SMALL (*)    Ketones, ur 5 (*)    All other components within normal limits  MONONUCLEOSIS SCREEN - Abnormal; Notable for the following components:   Mono Screen POSITIVE (*)    All other components within normal limits  CBC - Abnormal; Notable for the following components:   RBC 3.67 (*)    Hemoglobin 10.8 (*)    HCT 32.2 (*)    All other components within normal limits  BASIC METABOLIC PANEL - Abnormal; Notable for the following components:   Glucose, Bld 114 (*)    Calcium 8.6 (*)    All other components within normal limits  GC/CHLAMYDIA PROBE AMP ([place]) NOT AT [facility] - Abnormal; Notable for the following components:   Neisseria gonorrhea **POSITIVE** (*)    All other components within normal limits  GROUP A STREP BY PCR  CULTURE, BLOOD (ROUTINE X 2)  CULTURE, BLOOD (ROUTINE X 2)  CBC WITH DIFFERENTIAL/PLATELET  COMPREHENSIVE METABOLIC PANEL  RPR  HIV ANTIBODY (ROUTINE TESTING W REFLEX)  HEPATITIS B SURFACE ANTIGEN  HEPATITIS B SURFACE ANTIBODY, QUANTITATIVE  I-STAT BETA HCG BLOOD, ED ([facility], [facility], [facility] ONLY)    EKG None  Radiology Dg Chest 2 View  Result Date: 04/20/2018 CLINICAL DATA:  Cough and sore throat. EXAM: CHEST - 2 VIEW COMPARISON:  01/23/2018 FINDINGS: The heart size and mediastinal contours are within normal limits. Both lungs are clear. The visualized skeletal structures are unremarkable. IMPRESSION: No active cardiopulmonary disease. Electronically Signed   By: Deatra Robinson M.D.   On: 04/20/2018 06:11    Procedures .Critical Care Performed by: Barkley Boards, PA-C Authorized by: Barkley Boards, PA-C   Critical care provider statement:    Critical care time (minutes):  50   Critical care time was exclusive of:  Separately billable procedures and treating other patients and teaching time   Critical care was necessary to treat or prevent imminent or life-threatening deterioration of the following conditions: Disseminated  gonorrhea.   Critical care was time spent personally by me on the following activities:  Development of treatment plan with patient or surrogate, evaluation of patient's response to treatment, examination of patient, obtaining history from patient or surrogate, ordering and review of laboratory studies, ordering and review of radiographic studies, ordering and performing treatments and interventions, pulse oximetry, re-evaluation of patient's condition and review of old charts   (including critical care time)  Medications Ordered in ED Medications  enoxaparin (LOVENOX) injection 40 mg (40 mg Subcutaneous Not Given 04/20/18 1955)  acetaminophen (TYLENOL) tablet 650 mg (650 mg Oral Given 04/20/18 2016)    Or  acetaminophen (TYLENOL) suppository 650 mg ( Rectal See Alternative 04/20/18 2016)  senna-docusate (Senokot-S) tablet 1 tablet (has no administration in time range)  albuterol (PROVENTIL) (2.5 MG/3ML) 0.083% nebulizer solution 3 mL (has no administration in time range)  ondansetron (ZOFRAN) injection 4 mg (has no administration in time range)  cefTRIAXone (ROCEPHIN) 1 g in sodium chloride 0.9 % 100 mL IVPB (has no administration in time range)  ketorolac (TORADOL) 30 MG/ML injection 15 mg (15 mg Intravenous Given 04/20/18 2213)  metroNIDAZOLE (FLAGYL) tablet 500 mg (has no administration in time range)  morphine 4  MG/ML injection 4 mg (4 mg Intravenous Given 04/20/18 0757)  sodium chloride 0.9 % bolus 1,000 mL (0 mLs Intravenous Stopped 04/20/18 1221)  cefTRIAXone (ROCEPHIN) 1 g in sodium chloride 0.9 % 100 mL IVPB (0 g Intravenous Stopped 04/20/18 1004)  azithromycin (ZITHROMAX) tablet 1,000 mg (1,000 mg Oral Given 04/20/18 0936)  acetaminophen (TYLENOL) tablet 650 mg (650 mg Oral Given 04/20/18 0944)  ondansetron (ZOFRAN) injection 4 mg (4 mg Intravenous Given 04/20/18 1043)     Initial Impression / Assessment and Plan / ED Course  I have reviewed the triage vital signs and the nursing  notes.  Pertinent labs & imaging results that were available during my care of the patient were reviewed by me and considered in my medical decision making (see chart for details).     28 year old female with history of chlamydia, asthma, and anxiety presenting with hemoptysis, shortness of breath, polyarthralgias, headache, and sore throat for the last week.  She is also been having intermittent malodorous, discolored vaginal discharge for several months.  The patient was seen and evaluated along with Dr. Blinda LeatherwoodPollina, attending physician.  On exam, she has enlarged tonsils bilaterally.  She appears very uncomfortable and pain out of proportion to exam with minimal palpation of her her ankle, knee, and finger.  She is barely able to move her ankle or her knee.  She also reports she had had other bony pains over the last week that have since resolved.  Pelvic exam with copious amounts of malodorous vaginal discharge.  Chest x-ray is negative.  Metabolic panel is unremarkable.  No leukocytosis on CBC.  Sed rate is 56 and CRP is 2.0.  Pregnancy test is negative.  Labs are otherwise pending.  Initially, I was concerned for acute flare of an undiagnosed rheumatological disorder given the patient's gender and age.  She has no family history of autoimmune or rheumatological disorders.  Upon further review of her chart, she did previously have chlamydia for which she was treated.  Her pelvic exam was concerning for STI, particularly disseminated gonorrhea.  I have initiated Rocephin and azithromycin in the ED.  She was given morphine for pain control.  [facility] at the end of my shift for following up on labs.  The patient will require admission for further work-up and evaluation, particularly if disseminated gonorrhea to ensure that she does not have septic joints or tenosynovitis.  ED course, and plan of care discussed with review of all pertinent labs and imaging. Please see his/her  note for further details regarding further ED course and disposition.   Final Clinical Impressions(s) / ED Diagnoses   Final diagnoses:  Polyarthralgia    ED Discharge Orders    None       Barkley BoardsMcDonald, Elijah Phommachanh A, PA-C 04/21/18 [id number]    Gilda CreasePollina, Christopher J, MD 04/27/18 2356

## 2018-04-20 NOTE — H&P (Signed)
Date: 04/20/2018               Patient Name:  Claudia Gonzales MRN: [id number]  DOB: 1990/09/30 Age / Sex: 28 y.o., female   PCP: Patient, No Pcp Per         Medical Service: Internal Medicine Teaching Service         Attending Physician: Dr. Annia Belt, MD    First Contact: Dr. Annie Paras Pager: [phone]  Second Contact: Dr. Tarri Abernethy Pager: [phone]       After Hours (After 5p/  First Contact Pager: [phone]  weekends / holidays): Second Contact Pager: [phone]   Chief Complaint: ankle and knee pain  History of Present Illness: Ms. Claudia Gonzales is a 28 year old female that presents with a 3 day history of progressively worsening ankle and knee pain. She noticed a blister on her left ankle yesterday and tried to pop it but no fluid came out. She states that the ankle joint became painful and swollen. She also noticed a blister on her left index finger that popped about 2 days ago. She reports increased pain in her knee in the past day. She has now noticed a new blister on her left wrist. She states that her findings and symptoms are all new in the past 3 days.  She states she was sexually active 2 months ago but cannot remember if she used protection. She reports subjective fevers at home.  She has associated symptoms of headache and nausea.  She denies vomiting, sore throat, coughing or rash.  Meds:  Current Meds  Medication Sig  . fluticasone (FLONASE) 50 MCG/ACT nasal spray Place 1 spray into both nostrils daily. (Patient taking differently: Place 1 spray into both nostrils daily as needed for allergies or rhinitis. )     Allergies: Allergies as of 04/20/2018  . (No Known Allergies)   Past Medical History:  Diagnosis Date  . Anxiety   . Asthma   . Chlamydia   . Preterm labor     Family History:  Family History  Problem Relation Age of Onset  . Hypertension Mother   . Cancer Father        colon  . Cancer Maternal Grandmother   . Cancer Maternal  Grandfather   . Cancer Paternal Grandmother   . Cancer Paternal Grandfather      Social History: tobacco use: Denies Illicit drug use: Denies Alcohol use: Denies  Review of Systems: A complete ROS was negative except as per HPI.   Physical Exam: Blood pressure 105/66, pulse 92, temperature (!) 101 F (38.3 C), temperature source Oral, resp. rate 20, SpO2 99 %, unknown if currently breastfeeding. Physical Exam  Constitutional:  somnolent  Cardiovascular: Normal rate, regular rhythm and normal heart sounds. Exam reveals no gallop and no friction rub.  No murmur heard. Pulmonary/Chest: Effort normal and breath sounds normal. No respiratory distress. She has no wheezes. She has no rales.  Abdominal: Soft. Bowel sounds are normal. She exhibits no distension. There is no abdominal tenderness. There is no rebound and no guarding.  Musculoskeletal: Normal range of motion.     Comments: No tenderness to right knee or swelling  Skin: Skin is warm and dry. No erythema.  Blister noted at the lateral aspect of the left ankle, swelling and tenderness noted at the site Dried blister at left index finger Blister on left wrist    CXR: personally reviewed my interpretation is no acute cardiopulmonary disease  Assessment &  Plan by Problem: Active Problems:   Polyarthralgia  28 year old female with self reported history of asthma that presents to the ED with a 3 day history of fever, several painful joints and lesions on ankle and hand.  Polyarthralgia  Symptoms concerning for disseminated gonococcal infection. CRP and ESR were elevated in the ED.  Pelvic exam was done in the ED by provider and noted excessive discharge on exam. Patient was started on IV ceftriaxone and oral azithromycin.  GC/Chlamydia pending.  If negative can further work up for autoimmune causes.  Would also consider parvovirus B19, measles, rubella, lyme and arboviral infection if GC/chlamydia negative.  Left ankle joint  appears swollen but not hot to the touch without purulent drainage. Other joints are not swollen. -follow-up GC/chlamydia   -follow-up blood cultures -Follow-up RPR, HIV antibody, Hepatitis B -IV ceftriaxone - s/p azithromycin   Asthma Patient reports a history of asthma and she uses an inhaler as needed. -Albuterol prn  Dispo: Admit patient to Inpatient with expected length of stay greater than 2 midnights.  Signed: Valinda Party, DO 04/20/2018, 11:04 AM  Pager: [phone]

## 2018-04-20 NOTE — ED Triage Notes (Signed)
Pt states that she has swelling in multiple joints, her R knee, L ankle and her L index finger for several days. Pt reports also having fevers, headaches, cough, sore throat

## 2018-04-20 NOTE — ED Notes (Signed)
Dinner tray ordered.

## 2018-04-20 NOTE — Progress Notes (Signed)
Pt refused her lovenox, stated she will walk and move around so she doesn't need it because it hurt.

## 2018-04-21 DIAGNOSIS — Z8619 Personal history of other infectious and parasitic diseases: Secondary | ICD-10-CM

## 2018-04-21 DIAGNOSIS — N760 Acute vaginitis: Secondary | ICD-10-CM

## 2018-04-21 DIAGNOSIS — A5486 Gonococcal sepsis: Secondary | ICD-10-CM | POA: Diagnosis present

## 2018-04-21 DIAGNOSIS — A5489 Other gonococcal infections: Secondary | ICD-10-CM

## 2018-04-21 DIAGNOSIS — M25472 Effusion, left ankle: Secondary | ICD-10-CM

## 2018-04-21 LAB — CBC
HEMATOCRIT: 32.2 % — AB (ref 36.0–46.0)
Hemoglobin: 10.8 g/dL — ABNORMAL LOW (ref 12.0–15.0)
MCH: 29.4 pg (ref 26.0–34.0)
MCHC: 33.5 g/dL (ref 30.0–36.0)
MCV: 87.7 fL (ref 80.0–100.0)
Platelets: 234 10*3/uL (ref 150–400)
RBC: 3.67 MIL/uL — AB (ref 3.87–5.11)
RDW: 13.1 % (ref 11.5–15.5)
WBC: 8.5 10*3/uL (ref 4.0–10.5)
nRBC: 0 % (ref 0.0–0.2)

## 2018-04-21 LAB — BASIC METABOLIC PANEL
Anion gap: 7 (ref 5–15)
BUN: 12 mg/dL (ref 6–20)
CO2: 23 mmol/L (ref 22–32)
Calcium: 8.6 mg/dL — ABNORMAL LOW (ref 8.9–10.3)
Chloride: 107 mmol/L (ref 98–111)
Creatinine, Ser: 0.71 mg/dL (ref 0.44–1.00)
GFR calc Af Amer: >60 mL/min (ref >60)
GFR calc non Af Amer: >60 mL/min (ref >60)
Glucose, Bld: 114 mg/dL — ABNORMAL HIGH (ref 70–99)
Potassium: 4 mmol/L (ref 3.5–5.1)
Sodium: 137 mmol/L (ref 135–145)

## 2018-04-21 LAB — HEPATITIS B SURFACE ANTIGEN: Hepatitis B Surface Ag: NEGATIVE

## 2018-04-21 LAB — HEPATITIS B SURFACE ANTIBODY, QUANTITATIVE: Hep B S AB Quant (Post): 152.3 m[IU]/mL (ref >9.9)

## 2018-04-21 MED ORDER — METRONIDAZOLE 500 MG PO TABS
500.0000 mg | ORAL_TABLET | Freq: Two times a day (BID) | ORAL | Status: DC
  Administered 2018-04-21 – 2018-04-24 (×7): 500 mg via ORAL
  Filled 2018-04-21 (×7): qty 1

## 2018-04-21 NOTE — Progress Notes (Addendum)
   Subjective: No overnight events. Patient feels improved this morning, but she continues to have pain in the left ankle. Bearing weight hurts, so she has to hop on her right foot. She also noticed a painful bump near her frontal hairline. All her questions were answered.  Objective:  Vital signs in last 24 hours: Vitals:   04/20/18 1430 04/20/18 1616 04/20/18 2205 04/21/18 0537  BP: 101/64 (!) 97/54 (!) 103/54 112/62  Pulse: 88 79 93 66  Resp: 18 18 17 14   Temp:  98.1 F (36.7 C) 98.7 F (37.1 C) 98.3 F (36.8 C)  TempSrc:  Oral Oral Oral  SpO2: 100% 100% 99% 100%   Gen: laying comfortably in bed, no distress HEENT: Mildly erythematous papule on the right frontal hairline. No cervical adenopathy. CV: RRR, no murmurs Ext: Left index finger with small erythematous ruptured blister. No swelling or increased warmth of the finger. Right knee without TTP, swelling, or increased warmth. Swelling of the left ankle with increased warmth. No overlying erythema. Patient has tenderness on palpation posterior of the lateral malleolus that continues along the lateral leg.   Assessment/Plan:  Active Problems:   Polyarthralgia  Ms. Plancarte is a 28 yo female with a medical history of asthma who presented with fever, several painful joints, blisters overlying the painful joints, and yellow vaginal discharge. She was admitted for further evaluation and management.  Disseminated Gonococcal Infection - Joint pain has improved, however she still has pain and difficulty walking on the left ankle. - Afebrile without leukocytosis. - Gonorrhea positive. Patient also was positive for gonorrhea back in 05/2017. She received treatment at the time, but feels that the symptoms of headache and sore throat never went away completely. She had no follow-up lab work to confirm treatment.  - HIV and RPR negative - Received a one-time dose of azithromycin 1/16 - If she has an effusion of the left ankle, she will  require wash out. - She will require 7 days of ceftriaxone therapy, either IV or IM. Will continue IV therapy while inpatient. Discharge planning upon improvement in her symptoms and ability to ambulate. Plan - Continue IV ceftriaxone - Bedside US of the left ankle to evaluate for effusion - F/u blood cultures - Toradol and Tylenol PRN for pain   Bacterial Vaginosis - Clue cells on wet prep on admission  Plan - Flagyl 500mg  BID for 7 days (day 1)  Positive Mono Screen - Patient has a history sore throat intermittently for one year. She reports the sore throat has improved today. No cervical lymphadenopathy on exam. Normal oropharynx and no hepatosplenomegaly on admission. No leukocytosis. This may be a false positive result as the patient does not seem to have any signs or symptoms of infectious mononucleosis.  Plan - Monitor for signs/symptoms - Will not pursue confirmatory antibody testing at this time due to low suspicion  Dispo: Anticipated discharge in approximately 1-2 days.   Gonzales, Claudia Cortieborah N, MD 04/21/2018, 6:41 AM Pager: [phone]

## 2018-04-22 DIAGNOSIS — B9689 Other specified bacterial agents as the cause of diseases classified elsewhere: Secondary | ICD-10-CM

## 2018-04-22 DIAGNOSIS — N760 Acute vaginitis: Secondary | ICD-10-CM

## 2018-04-22 MED ORDER — CEFTRIAXONE SODIUM 1 G IJ SOLR: 1000.0000 mg | INTRAMUSCULAR | Status: DC

## 2018-04-22 NOTE — Discharge Summary (Addendum)
Name: Claudia Gonzales MRN: [id number] DOB: December 27, 1990 27 y.o. PCP: Patient, No Pcp Per  Date of Admission: 04/20/2018 12:44 AM Date of Discharge: 04/24/2018 Attending Physician: Dr. Cyndie Chime  Discharge Diagnosis: 1. Disseminated gonoccocal infection 2. Bacterial vaginosis  Discharge Medications: Allergies as of 04/24/2018   No Known Allergies     Medication List    STOP taking these medications   benzonatate 100 MG capsule Commonly known as:  TESSALON   naproxen 375 MG tablet Commonly known as:  NAPROSYN   ondansetron 4 MG disintegrating tablet Commonly known as:  ZOFRAN ODT     TAKE these medications   cefTRIAXone 500 MG injection Commonly known as:  ROCEPHIN Inject 250 mg into the muscle daily. For IM use in large muscle mass Start taking on:  April 25, 2018   fluticasone 50 MCG/ACT nasal spray Commonly known as:  FLONASE Place 1 spray into both nostrils daily. What changed:    when to take this  reasons to take this   metroNIDAZOLE 500 MG tablet Commonly known as:  FLAGYL Take 1 tablet (500 mg total) by mouth 2 (two) times daily for 3 days.       Disposition and follow-up:   ClaudiaClaudia Gonzales was discharged from [facility] in Good condition.  At the hospital follow up visit please address:  1.  Disseminated gonococcal infection - Presented with fever, scant pustules, and polyarthralgias (worst in the left ankle) - No evidence of purulence arthritis. No need for joint aspiration or joint washout.  - Treated with 5 days of ceftriaxone inpatient. Two more days of IM ceftriaxone ordered for 1/20 and 1/21 at the [facility] medical same day center. - Please ensure patient received the two IM doses - Please consider confirmatory testing to ensure eradication of gonorrhea infection  2. Bacterial vaginosis - Treated with 7 days of Flagyl  3.  Labs / imaging needed at time of follow-up: GC/CT  4.  Pending labs/ test needing  follow-up: none  Follow-up Appointments: Follow-up Information    [facility]. Go on 05/16/2018.   Why:  9:45 am, Dr. Shan Gonzales Contact information: [address] [place] [address] [phone]          Hospital Course by problem list: 1. Disseminated gonococcal infection: Claudia Gonzales is a 28 yo female with a medical history of asthma who presented with fever, several painful joints, blisters overlying the painful joints, and yellow vaginal discharge. Gonorrhea test, presumably from the cervix, was positive. Blood cultures were negative. Bedside US on 1/16 showed scant effusion of the lateral left ankle, not sufficient to sample. Does not require arthrocentesis or joint wash out at this time. Patient was empirically treated for disseminated gonococcal infection with one dose of azithromycin and 5 days of IV ceftriaxone. At time of discharge, two more days of IM ceftriaxone were scheduled for 1/20 and 1/21 at the [facility] medical same day center. Her symptoms improved during hospitalization and she was able to bear weight on her left ankle without issue. She does not have a PCP, but is scheduled to [facility] care with [facility] on 05/16/2018.  2. BV: Clue cells on wet prep on admission. Treated with 7-day course of flagyl.  Discharge Vitals:   BP (!) 97/57 (BP Location: Right Arm)   Pulse 74   Temp 98.4 F (36.9 C) (Oral)   Resp 16   Ht 5\' 6"  (1.676 m)  Wt 114.5 kg   SpO2 100%   BMI 40.74 kg/m   Pertinent Labs, Studies, and Procedures:  CBC Latest Ref Rng & Units 04/21/2018 04/20/2018 01/23/2018  WBC 4.0 - 10.5 K/uL 8.5 9.3 4.7  Hemoglobin 12.0 - 15.0 g/dL 10.8(L) 12.1 12.3  Hematocrit 36.0 - 46.0 % 32.2(L) 37.0 38.5  Platelets 150 - 400 K/uL 234 274 244   CMP Latest Ref Rng & Units 04/21/2018 04/20/2018 01/23/2018  Glucose 70 - 99 mg/dL 381(R) 93 711(A)  BUN 6 - 20 mg/dL 12 14 9   Creatinine 0.44 -  1.00 mg/dL 5.79 0.38 3.33  Sodium 135 - 145 mmol/L 137 137 135  Potassium 3.5 - 5.1 mmol/L 4.0 4.0 3.8  Chloride 98 - 111 mmol/L 107 102 106  CO2 22 - 32 mmol/L 23 24 23   Calcium 8.9 - 10.3 mg/dL 8.3(A) 9.3 9.2  Total Protein 6.5 - 8.1 g/dL - 7.9 9.1(B)  Total Bilirubin 0.3 - 1.2 mg/dL - 0.7 0.5  Alkaline Phos 38 - 126 U/L - 58 49  AST 15 - 41 U/L - 15 19  ALT 0 - 44 U/L - 18 20     Discharge Instructions: Discharge Instructions    Discharge instructions   Complete by:  As directed    It was a pleasure taking care of you while you were in the hospital Claudia Gonzales!  1. You were hospitalized for a vaginal infection (gonorrhea) that spread throughout the body and affected your joints and your skin. You were treated with 5 days of antibiotics. You are safe for discharge home, however you need two more days of antibiotics. PLEASE REPORT TO [place] [facility] ([facility], 2ND FLOOR) AT 10:30AM ON Monday 1/20 FOR AN ANTIBIOTIC INJECTION. You will also need to get an injection on Tuesday 1/21 (appointment time will be made during your Monday appointment).   2. You were found to have a second type of vaginal infection called bacterial vaginosis. You have been receiving the oral antibiotic flagyl for this. You should take 3 more days of flagyl 500mg  twice a day to clear this infection.   3. You have an appointment to establish care with a primary care doctor on 2/10 at [facility]. Please go to this appointment. They should repeat testing to ensure that your infections have been completely eradicated.   Feel free to call our clinic at [phone] if you have any questions.  Thanks, Dr. Avie Gonzales   Increase activity slowly   Complete by:  As directed       Signed: Severn Gonzales, Claudia Corti, MD 04/24/2018, 9:24 AM   Pager: [phone]

## 2018-04-22 NOTE — Progress Notes (Signed)
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   Ulyses Southward, PharmD, BCIDP, AAHIVP, CPP Infectious Disease Pharmacist 04/22/2018 11:12 AM

## 2018-04-22 NOTE — Progress Notes (Signed)
   Subjective: Patient states her left ankle still hurts with movement but has improved since yesterday. Overall she is feeling better. Discussed antibiotics and dispo plan with the patient. All questions and concerns addressed.   Objective:  Vital signs in last 24 hours: Vitals:   04/21/18 1339 04/21/18 2213 04/21/18 2246 04/22/18 0521  BP: 113/72 121/78  114/67  Pulse: 81 73  63  Resp: 18 16  14   Temp: 97.6 F (36.4 C) 98.5 F (36.9 C)  98.5 F (36.9 C)  TempSrc:    Oral  SpO2: 100% 100%  97%  Weight:   114.5 kg   Height:   5\' 6"  (1.676 m)    Gen: sleeping comfortably in bed, no distress HEENT: small pustule located at the frontal hairline Ext: left ankle appears less swollen than yesterday. She continues to have TTP, particularly of the lateral ankle. There is a new pustule over the lateral malleolus.   Assessment/Plan:  Principal Problem:   DGI (disseminated gonococcal infection) (HCC) Active Problems:   Polyarthralgia  Claudia Gonzales is a 28 yo female with a medical history of asthma who presented with fever, several painful joints, blisters overlying the painful joints, and yellow vaginal discharge. She was admitted for further evaluation and management.  Disseminated Gonococcal Infection - Left ankle pain continues to improve. - Afebrile without leukocytosis. Blood cultures negative x24 hrs. - Bedside US on 1/16 showed scant effusion of the lateral left ankle, not sufficient to sample. Does not require arthrocentesis or joint wash out at this time. - Received a one-time dose of azithromycin 1/15 - She will require 7 days of ceftriaxone therapy, either IV or IM. The plan is to administer 5 days of IV ceftriaxone while inpatient. After her fifth dose on Sunday, she can be discharged home. She can receive the remaining two doses of ceftriaxone as IM injections with medical same day. Arrangements have been made for these injections. She is scheduled for an injection on Monday  1/20 at 10:45am.  Plan - Continue IV ceftriaxone (day 3/7) - Tylenol PRN for pain   Bacterial Vaginosis - Clue cells on wet prep on admission  Plan - Flagyl 500mg  BID (day 2/7)  Dispo: Anticipated discharge in approximately two days.  Dorrell, Cathleen Corti, MD 04/22/2018, 6:38 AM Pager: [phone]

## 2018-04-23 LAB — GLUCOSE, CAPILLARY: Glucose-Capillary: 85 mg/dL (ref 70–99)

## 2018-04-23 NOTE — Progress Notes (Signed)
   Subjective: No overnight events. Patient reports she was able to bear weight on her left ankle more easily yesterday. She has not noticed any new skin lesions or joint pains. She is hoping to be discharged tomorrow. All of her questions were addressed.  Objective:  Vital signs in last 24 hours: Vitals:   04/21/18 2246 04/22/18 0521 04/22/18 1334 04/22/18 2228  BP:  114/67 123/72 121/69  Pulse:  63 69 82  Resp:  14 16 18   Temp:  98.5 F (36.9 C) 98.7 F (37.1 C) 99 F (37.2 C)  TempSrc:  Oral Oral Oral  SpO2:  97% 100% 100%  Weight: 114.5 kg     Height: 5\' 6"  (1.676 m)      Gen: sleeping comfortably in bed, no distress HEENT: small pustule located at the frontal hairline.  Pulm: CTAB, normal effort Ext: Left ankle is no longer TTP. Stable pustule over the lateral malleolus. Minimal swelling of the lateral ankle.  Assessment/Plan:  Principal Problem:   DGI (disseminated gonococcal infection) (HCC) Active Problems:   Polyarthralgia   Bacterial vaginosis  Claudia Gonzales is a 28 yo female with a medical history of asthma who presented with fever, several painful joints, blisters overlying the painful joints, and yellow vaginal discharge. She was admitted for further evaluation and management.  DisseminatedGonococcalInfection - Left ankle pain continues to improve and she has been able to bear weight. - Afebrile without leukocytosis. Blood cultures no growth x2days. - Received a one-time dose of azithromycin 1/15 - She will require 7 days of ceftriaxone therapy, either IV or IM. The plan is to administer 5 days of IV ceftriaxone while inpatient. After her fifth dose on Sunday, she can be discharged home if she continues to have clinical improvement and can bear weight on her left ankle. She can receive the remaining two doses of ceftriaxone as IM injections with medical same day. Arrangements have been made for these injections. She is scheduled for an injection on Monday 1/20 at  10:45am.  Plan - Continue IV ceftriaxone (day 4/7) - Tylenol PRN for pain   Bacterial Vaginosis - Clue cells on wet prep on admission  Plan - Flagyl500mg  BID (day 3/7)  Dispo: Anticipated discharge in approximately tomorrow.  , Claudia Corti, MD 04/23/2018, 6:30 AM Pager: [phone]

## 2018-04-23 NOTE — Progress Notes (Signed)
Pt qualifies as a high fall risk due to recent falls at home, but is refusing bed alarm abd assistance when getting up.

## 2018-04-24 MED ORDER — CEFTRIAXONE SODIUM 500 MG IJ SOLR: 250.0000 mg | INTRAMUSCULAR | 0 refills | Status: DC

## 2018-04-24 MED ORDER — METRONIDAZOLE 500 MG PO TABS: 500.0000 mg | ORAL_TABLET | Freq: Two times a day (BID) | ORAL | 0 refills | Status: AC

## 2018-04-24 NOTE — Progress Notes (Signed)
   Subjective: No overnight events. Claudia Gonzales reports that her left ankle feels better and she has been able to walk around her room without issue. She denies any new rashes or joint pain. She feels ready to go home today and understands the plan for antibiotic injections tomorrow and the next day.  Objective:  Vital signs in last 24 hours: Vitals:   04/23/18 0500 04/23/18 1419 04/23/18 2022 04/24/18 0601  BP: 103/68 (!) 96/50 122/81 (!) 97/57  Pulse: 92 73 68 74  Resp: 18 16 16 16   Temp: 98.6 F (37 C) 98.4 F (36.9 C) 98 F (36.7 C) 98.4 F (36.9 C)  TempSrc: Oral Oral Oral Oral  SpO2: 100% 99% 99% 100%  Weight:      Height:       OMV:EHMCNOBS comfortably in bed, no distress HEENT: small pustule located at the frontal hairline.  JGG:EZMO ankle is no longer TTP. Stable pustule over the lateral malleolus. Minimal swelling of the lateral ankle. She is able to dorsiflex and plantar flex the ankle without pain.  Assessment/Plan:  Principal Problem:   DGI (disseminated gonococcal infection) (HCC) Active Problems:   Polyarthralgia   Bacterial vaginosis  Claudia Gonzales is a 28 yo female with a medical history of asthma who presented with fever, several painful joints, blisters overlying the painful joints, and yellow vaginal discharge. She was admitted for further evaluation and management.  DisseminatedGonococcalInfection -Clinically improved. Weightbearing without issue. - Afebrile without leukocytosis.Blood cultures no growth x3 days. - Received a one-time dose of azithromycin 1/15 - She will require 7 days of ceftriaxone therapy, either IV or IM.The plan is to administer her 5th dose of IV ceftriaxone today and then discharge home. She can receive the remaining two doses of ceftriaxone as IM injections with medical same day. Arrangements have been made for these injections. She is scheduled for an injection on Monday 1/20 at 10:45am. Plan - Continue IV ceftriaxone(day  5/7) - IM ceftriaxone injections scheduled with medical same day on 1/20 and 1/21 -Tylenol PRN for pain  Bacterial Vaginosis - Clue cells on wet prep on admission  Plan - Flagyl500mg  BID (day4/7)  Dispo: Anticipated discharge today.  , Cathleen Corti, MD 04/24/2018, 6:50 AM Pager: [phone]

## 2018-04-24 NOTE — Care Management Note (Signed)
Case Management Note  Patient Details  Name: Claudia Gonzales MRN: [id number] Date of Birth: 1991/03/01  Subjective/Objective:                    Action/Plan:  Spoke w Dr Avie Arenas who clarified that patient will be getting IM Rocephin at clinic. Appointment made by MD team. [facility] letter provided for Rxs at DC. No other CM needs.    Expected Discharge Date:  04/22/18               Expected Discharge Plan:  Home/Self Care  In-House Referral:     Discharge planning Services     Post Acute Care Choice:    Choice offered to:     DME Arranged:    DME Agency:     HH Arranged:    HH Agency:     Status of Service:  Completed, signed off  If discussed at [employer] of Stay Meetings, dates discussed:    Additional Comments:  Lawerance Sabal, RN 04/24/2018, 9:55 AM

## 2018-04-25 ENCOUNTER — Encounter (HOSPITAL_COMMUNITY)
Admission: RE | Admit: 2018-04-25 | Discharge: 2018-04-25 | Disposition: A | Discharge status: Home / Self Care | Payer: Self-pay | Source: Ambulatory Visit | Attending: Internal Medicine | Admitting: Internal Medicine

## 2018-04-25 ENCOUNTER — Other Ambulatory Visit: Payer: Self-pay | Admitting: Internal Medicine

## 2018-04-25 DIAGNOSIS — A5486 Gonococcal sepsis: Secondary | ICD-10-CM | POA: Insufficient documentation

## 2018-04-25 LAB — CULTURE, BLOOD (ROUTINE X 2)
CULTURE: NO GROWTH
CULTURE: NO GROWTH
SPECIAL REQUESTS: ADEQUATE
Special Requests: ADEQUATE

## 2018-04-25 MED ORDER — LIDOCAINE HCL (PF) 1 % IJ SOLN
INTRAMUSCULAR | Status: AC
  Administered 2018-04-25: 12:00:00 via INTRAMUSCULAR
  Filled 2018-04-25: qty 2

## 2018-04-25 MED ORDER — CEFTRIAXONE SODIUM 1 G IJ SOLR
1.0000 g | INTRAMUSCULAR | Status: DC
  Administered 2018-04-25: 1 g via INTRAMUSCULAR
  Filled 2018-04-25: qty 10

## 2018-04-26 ENCOUNTER — Ambulatory Visit (HOSPITAL_COMMUNITY)
Admission: RE | Admit: 2018-04-26 | Discharge: 2018-04-26 | Disposition: A | Discharge status: Home / Self Care | Payer: Self-pay | Source: Ambulatory Visit | Attending: Internal Medicine | Admitting: Internal Medicine

## 2018-04-26 DIAGNOSIS — A5486 Gonococcal sepsis: Secondary | ICD-10-CM | POA: Insufficient documentation

## 2018-04-26 MED ORDER — CEFTRIAXONE SODIUM 1 G IJ SOLR
1.0000 g | INTRAMUSCULAR | Status: DC
  Administered 2018-04-26: 1 g via INTRAMUSCULAR
  Filled 2018-04-26: qty 10

## 2018-04-26 MED ORDER — LIDOCAINE HCL (PF) 1 % IJ SOLN
INTRAMUSCULAR | Status: AC
  Administered 2018-04-26: 09:00:00
  Filled 2018-04-26: qty 2

## 2018-05-03 ENCOUNTER — Encounter (HOSPITAL_COMMUNITY): Payer: Self-pay

## 2018-05-11 ENCOUNTER — Emergency Department (HOSPITAL_COMMUNITY)
Admission: EM | Admit: 2018-05-11 | Discharge: 2018-05-11 | Disposition: A | Discharge status: Home / Self Care | Payer: Self-pay | Attending: Emergency Medicine | Admitting: Emergency Medicine

## 2018-05-11 ENCOUNTER — Encounter (HOSPITAL_COMMUNITY): Payer: Self-pay | Admitting: *Deleted

## 2018-05-11 DIAGNOSIS — J101 Influenza due to other identified influenza virus with other respiratory manifestations: Secondary | ICD-10-CM | POA: Insufficient documentation

## 2018-05-11 DIAGNOSIS — J45909 Unspecified asthma, uncomplicated: Secondary | ICD-10-CM | POA: Insufficient documentation

## 2018-05-11 DIAGNOSIS — Z87891 Personal history of nicotine dependence: Secondary | ICD-10-CM | POA: Insufficient documentation

## 2018-05-11 DIAGNOSIS — J111 Influenza due to unidentified influenza virus with other respiratory manifestations: Secondary | ICD-10-CM

## 2018-05-11 DIAGNOSIS — J029 Acute pharyngitis, unspecified: Secondary | ICD-10-CM

## 2018-05-11 DIAGNOSIS — R69 Illness, unspecified: Secondary | ICD-10-CM

## 2018-05-11 LAB — GROUP A STREP BY PCR: GROUP A STREP BY PCR: NOT DETECTED

## 2018-05-11 MED ORDER — NAPROXEN 500 MG PO TABS: 500.0000 mg | ORAL_TABLET | Freq: Two times a day (BID) | ORAL | 0 refills | Status: DC

## 2018-05-11 NOTE — ED Triage Notes (Signed)
Pt in c/o body aches, congestion and sore throat that started this morning, reports fever today, no distress noted

## 2018-05-11 NOTE — ED Provider Notes (Signed)
[facility] EMERGENCY DEPARTMENT Provider Note   CSN: [id number] Arrival date & time: 05/11/18  1353     History   Chief Complaint Chief Complaint  Patient presents with  . Generalized Body Aches    HPI Claudia Gonzales is a 28 y.o. female.  HPI Patient was recently in the hospital for treatment of disseminated gonococcal infection.  Patient states her symptoms from that have been improving.  SHe is no longer having any joint pain or swelling.  Patient states this morning however she started having diffuse body aches.  She had a fever up to 100.  She has had congestion as well as a sore throat.  She feels like her tonsils are swollen.  She denies any vomiting or diarrhea.  No significant cough. Past Medical History:  Diagnosis Date  . Anxiety   . Asthma   . Chlamydia   . Depression   . Polyarthralgia 04/20/2018  . Preterm labor 2011    Patient Active Problem List   Diagnosis Date Noted  . Bacterial vaginosis 04/22/2018  . DGI (disseminated gonococcal infection) (HCC) 04/21/2018  . Polyarthralgia 04/20/2018  . Encounter for insertion of mirena IUD 10/03/2012  . Traumatic injury during pregnancy 07/21/2012  . Supervision of high risk pregnancy in third trimester 06/13/2012  . H/O premature delivery 05/02/2012  . Anxiety and depression 05/02/2012    Past Surgical History:  Procedure Laterality Date  . APPENDECTOMY    . WISDOM TOOTH EXTRACTION       OB History    Gravida  3   Para  2   Term  1   Preterm  1   AB      Living  2     SAB      TAB      Ectopic      Multiple      Live Births  2            Home Medications    Prior to Admission medications   Medication Sig Start Date End Date Taking? Authorizing Provider  cefTRIAXone (ROCEPHIN) 500 MG injection Inject 250 mg into the muscle daily. For IM use in large muscle mass 04/25/18   Dorrell, Cathleen Corti, MD  fluticasone (FLONASE) 50 MCG/ACT nasal spray Place 1 spray into  both nostrils daily. Patient taking differently: Place 1 spray into both nostrils daily as needed for allergies or rhinitis.  01/23/18   Maxwell Caul, PA-C  naproxen (NAPROSYN) 500 MG tablet Take 1 tablet (500 mg total) by mouth 2 (two) times daily. 05/11/18   Linwood Dibbles, MD    Family History Family History  Problem Relation Age of Onset  . Hypertension Mother   . Cancer Father        colon  . Cancer Maternal Grandmother   . Cancer Maternal Grandfather   . Cancer Paternal Grandmother   . Cancer Paternal Grandfather     Social History Social History   Tobacco Use  . Smoking status: Former Smoker    Packs/day: 0.25    Years: 1.00    Pack years: 0.25    Types: Cigarettes    Last attempt to quit: 06/17/2012    Years since quitting: 5.9  . Smokeless tobacco: Never Used  Substance Use Topics  . Alcohol use: Not Currently    Comment: 04/20/2018 "glass of wine qd til ~ 1 yr ago; now might have a glass of wine on major holidays"  . Drug use: Not  Currently    Types: Marijuana    Comment: 04/20/2018 "nothing since ~ 2016"     Allergies   Patient has no known allergies.   Review of Systems Review of Systems  All other systems reviewed and are negative.    Physical Exam Updated Vital Signs BP 129/73 (BP Location: Right Arm)   Pulse 92   Temp 99.3 F (37.4 C) (Oral)   Resp 18   SpO2 100%   Physical Exam Vitals signs and nursing note reviewed.  Constitutional:      General: She is not in acute distress.    Appearance: She is well-developed.  HENT:     Head: Normocephalic and atraumatic.     Right Ear: External ear normal.     Left Ear: External ear normal.     Mouth/Throat:     Pharynx: Oropharyngeal exudate and posterior oropharyngeal erythema present.  Eyes:     General: No scleral icterus.       Right eye: No discharge.        Left eye: No discharge.     Conjunctiva/sclera: Conjunctivae normal.  Neck:     Musculoskeletal: Neck supple.     Trachea: No  tracheal deviation.  Cardiovascular:     Rate and Rhythm: Regular rhythm. Tachycardia present.  Pulmonary:     Effort: Pulmonary effort is normal. No respiratory distress.     Breath sounds: Normal breath sounds. No stridor. No wheezing or rales.  Abdominal:     General: Bowel sounds are normal. There is no distension.     Palpations: Abdomen is soft.     Tenderness: There is no abdominal tenderness. There is no guarding or rebound.  Musculoskeletal:        General: No tenderness.     Comments: Left ankle where patient had  previous infection is no longer erythematous or edematous  Skin:    General: Skin is warm and dry.     Findings: No rash.  Neurological:     Mental Status: She is alert.     Cranial Nerves: No cranial nerve deficit (no facial droop, extraocular movements intact, no slurred speech).     Sensory: No sensory deficit.     Motor: No abnormal muscle tone or seizure activity.     Coordination: Coordination normal.      ED Treatments / Results  Labs (all labs ordered are listed, but only abnormal results are displayed) Labs Reviewed  GROUP A STREP BY PCR     Procedures Procedures (including critical care time)  Medications Ordered in ED Medications - No data to display   Initial Impression / Assessment and Plan / ED Course  I have reviewed the triage vital signs and the nursing notes.  Pertinent labs & imaging results that were available during my care of the patient were reviewed by me and considered in my medical decision making (see chart for details).   No joint swelling noted. Doubt recurrent gonococcal infection.  URI sx now and phayngitis.   Consistent with infleunza like illness.  Final Clinical Impressions(s) / ED Diagnoses   Final diagnoses:  Pharyngitis, unspecified etiology  Influenza-like illness    ED Discharge Orders         Ordered    naproxen (NAPROSYN) 500 MG tablet  2 times daily     05/11/18 1553           Linwood DibblesKnapp, Meliza Kage,  MD 05/14/18 [phone]

## 2018-05-11 NOTE — Discharge Instructions (Addendum)
Take the medications to help with the aches and pains, you can also take Tylenol for fever, the counter medications such as Cepacol can help with your sore throat.  Follow-up with the [facility]

## 2018-05-15 NOTE — Progress Notes (Deleted)
   Subjective:    Patient ID: Claudia Gonzales, female    DOB: 10-01-1990, 28 y.o.   MRN: [id number]  28 y.o.F with hx of DGI s/p Rx with ceftriaxone then recent viral URI in ED 05/11/18  Date of Admission: 04/20/2018 12:44 AM Date of Discharge: 04/24/2018 Attending Physician: Dr. Cyndie Chime  Discharge Diagnosis: 1. Disseminated gonoccocal infection 2. Bacterial vaginosis  Discharge Medications: Allergies as of 04/24/2018  No Known Allergies   Medication List  STOP taking these medications  benzonatate 100 MG capsule Commonly known as:  TESSALON  naproxen 375 MG tablet Commonly known as:  NAPROSYN  ondansetron 4 MG disintegrating tablet Commonly known as:  ZOFRAN ODT   TAKE these medications  cefTRIAXone 500 MG injection Commonly known as:  ROCEPHIN Inject 250 mg into the muscle daily. For IM use in large muscle mass Start taking on:  April 25, 2018  fluticasone 50 MCG/ACT nasal spray Commonly known as:  FLONASE Place 1 spray into both nostrils daily. What changed:    when to take this  reasons to take this  metroNIDAZOLE 500 MG tablet Commonly known as:  FLAGYL Take 1 tablet (500 mg total) by mouth 2 (two) times daily for 3 days.     Disposition and follow-up:   Claudia Gonzales was discharged from [facility] in Good condition.  At the hospital follow up visit please address:  1.  Disseminated gonococcal infection - Presented with fever, scant pustules, and polyarthralgias (worst in the left ankle) - No evidence of purulence arthritis. No need for joint aspiration or joint washout.  - Treated with 5 days of ceftriaxone inpatient. Two more days of IM ceftriaxone ordered for 1/20 and 1/21 at the [facility] medical same day center. - Please ensure patient received the two IM doses - Please consider confirmatory testing to ensure eradication of gonorrhea infection  2. Bacterial vaginosis - Treated with 7 days of  Flagyl  3.  Labs / imaging needed at time of follow-up: GC/CT  4.  Pending labs/ test needing follow-up: none  Hospital Course by problem list: 1. Disseminated gonococcal infection: Claudia Gonzales is a 28 yo female with a medical history of asthma who presented with fever, several painful joints, blisters overlying the painful joints, and yellow vaginal discharge. Gonorrhea test, presumably from the cervix, was positive. Blood cultures were negative. Bedside USon 1/16showed scant effusion of the lateral left ankle, not sufficient to sample. Does not require arthrocentesis or joint wash out at this time. Patient was empirically treated for disseminated gonococcal infection with one dose of azithromycin and 5 days of IV ceftriaxone. At time of discharge, two more days of IM ceftriaxone were scheduled for 1/20 and 1/21 at the [facility] medical same day center. Her symptoms improved during hospitalization and she was able to bear weight on her left ankle without issue. She does not have a PCP, but is scheduled to establish care with [facility] on 05/16/2018.  2. BV: Clue cells on wet prep on admission. Treated with 7-day course of flagyl.      Review of Systems     Objective:   Physical Exam        Assessment & Plan:

## 2018-05-16 ENCOUNTER — Inpatient Hospital Stay: Payer: Self-pay | Admitting: Critical Care Medicine

## 2018-07-10 ENCOUNTER — Encounter (HOSPITAL_COMMUNITY): Payer: Self-pay

## 2018-07-10 ENCOUNTER — Emergency Department (HOSPITAL_COMMUNITY)
Admission: EM | Admit: 2018-07-10 | Discharge: 2018-07-10 | Disposition: A | Discharge status: Home / Self Care | Payer: Medicaid Other | Attending: Emergency Medicine | Admitting: Emergency Medicine

## 2018-07-10 ENCOUNTER — Other Ambulatory Visit: Payer: Self-pay

## 2018-07-10 DIAGNOSIS — R0600 Dyspnea, unspecified: Secondary | ICD-10-CM | POA: Diagnosis present

## 2018-07-10 DIAGNOSIS — Z5321 Procedure and treatment not carried out due to patient leaving prior to being seen by health care provider: Secondary | ICD-10-CM | POA: Diagnosis not present

## 2018-07-10 NOTE — ED Notes (Addendum)
Patient walked out of room and said, "This is taking too long. I'm leaving." Patient was told that a doctor would be in shortly, however, she still wanted to leave. Patient said she had a safe place to stay for the night.

## 2018-07-10 NOTE — ED Provider Notes (Addendum)
Patient had already left the emergency room prior to my assessment. According to the nursing staff, patient complained of Dr. taking too long.  Patient's total time in the emergency room was less than 30 minutes before she decided to leave. I never had the opportunity to see the patient, as when I reached the room she was already gone.       Derwood Kaplan, MD 07/10/18 [phone]
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
## 2018-10-23 ENCOUNTER — Emergency Department (HOSPITAL_COMMUNITY)
Admission: EM | Admit: 2018-10-23 | Discharge: 2018-10-23 | Disposition: A | Discharge status: Home / Self Care | Payer: Medicaid Other | Attending: Emergency Medicine | Admitting: Emergency Medicine

## 2018-10-23 ENCOUNTER — Other Ambulatory Visit: Payer: Self-pay

## 2018-10-23 ENCOUNTER — Encounter (HOSPITAL_COMMUNITY): Payer: Self-pay

## 2018-10-23 DIAGNOSIS — J45909 Unspecified asthma, uncomplicated: Secondary | ICD-10-CM | POA: Insufficient documentation

## 2018-10-23 DIAGNOSIS — S025XXB Fracture of tooth (traumatic), initial encounter for open fracture: Secondary | ICD-10-CM

## 2018-10-23 DIAGNOSIS — K0889 Other specified disorders of teeth and supporting structures: Secondary | ICD-10-CM

## 2018-10-23 DIAGNOSIS — K029 Dental caries, unspecified: Secondary | ICD-10-CM | POA: Diagnosis not present

## 2018-10-23 DIAGNOSIS — Z87891 Personal history of nicotine dependence: Secondary | ICD-10-CM | POA: Insufficient documentation

## 2018-10-23 MED ORDER — PENICILLIN V POTASSIUM 500 MG PO TABS
500.0000 mg | ORAL_TABLET | Freq: Once | ORAL | Status: AC
  Administered 2018-10-23: 04:00:00 500 mg via ORAL
  Filled 2018-10-23: qty 1

## 2018-10-23 MED ORDER — KETOROLAC TROMETHAMINE 10 MG PO TABS
10.0000 mg | ORAL_TABLET | Freq: Once | ORAL | Status: AC
  Administered 2018-10-23: 10 mg via ORAL
  Filled 2018-10-23: qty 1

## 2018-10-23 MED ORDER — FLUCONAZOLE 150 MG PO TABS: 150.0000 mg | ORAL_TABLET | Freq: Once | ORAL | 0 refills | Status: AC

## 2018-10-23 MED ORDER — PENICILLIN V POTASSIUM 500 MG PO TABS: 500.0000 mg | ORAL_TABLET | Freq: Four times a day (QID) | ORAL | 0 refills | Status: AC

## 2018-10-23 NOTE — ED Triage Notes (Signed)
Pt reports L upper dental pain. She states that she thinks that she broke a tooth this morning. She also reports a headache on that same side that she attributed to the tooth. Denies fever or sick contacts.

## 2018-10-23 NOTE — Discharge Instructions (Addendum)
1. Medications: alternate ibuprofen and tylenol every 3 hours for pain control, penicillin, diflucan, usual home medications 2. Treatment: rest, drink plenty of fluids, take medications as prescribed 3. Follow Up: Please followup with dentistry within 1 week for discussion of your diagnoses and further evaluation after today's visit; if you do not have a primary care doctor use the resource guide provided to find one; Return to the ER for high fevers, difficulty breathing, difficulty swallowing or other concerning symptoms

## 2018-10-23 NOTE — ED Provider Notes (Signed)
[facility]-EMERGENCY DEPT Provider Note   CSN: [id number] Arrival date & time: 10/23/18  0051    History   Chief Complaint Chief Complaint  Patient presents with  . Dental Pain    HPI Claudia Landsberghyllis E Whittaker is a 28 y.o. female with a hx of anxiety, asthma, depression presents to the Emergency Department complaining of acute, persistent left upper dental pain onset approximately 12 hours ago.  Patient reports that her tooth broke while she was eating and she had almost immediate pain.  She reports taking ibuprofen and Tylenol without relief.  No fevers or chills, neck pain, neck stiffness, chest pain, abdominal pain, nausea or vomiting.  Patient reports that she currently has an appointment with a dentist to have the tooth pulled in August.  Eating and drinking make her symptoms worse.  Nothing seems to make them better.  Patient reports the pain has prevented her from sleeping tonight.        The history is provided by the patient and medical records. No language interpreter was used.    Past Medical History:  Diagnosis Date  . Anxiety   . Asthma   . Chlamydia   . Depression   . Polyarthralgia 04/20/2018  . Preterm labor 2011    Patient Active Problem List   Diagnosis Date Noted  . Bacterial vaginosis 04/22/2018  . DGI (disseminated gonococcal infection) (HCC) 04/21/2018  . Polyarthralgia 04/20/2018  . Encounter for insertion of mirena IUD 10/03/2012  . Traumatic injury during pregnancy 07/21/2012  . Supervision of high risk pregnancy in third trimester 06/13/2012  . H/O premature delivery 05/02/2012  . Anxiety and depression 05/02/2012    Past Surgical History:  Procedure Laterality Date  . APPENDECTOMY    . WISDOM TOOTH EXTRACTION       OB History    Gravida  3   Para  2   Term  1   Preterm  1   AB      Living  2     SAB      TAB      Ectopic      Multiple      Live Births  2            Home Medications    Prior to  Admission medications   Medication Sig Start Date End Date Taking? Authorizing Provider  cefTRIAXone (ROCEPHIN) 500 MG injection Inject 250 mg into the muscle daily. For IM use in large muscle mass 04/25/18   Dorrell, Cathleen Cortieborah N, MD  fluconazole (DIFLUCAN) 150 MG tablet Take 1 tablet (150 mg total) by mouth once for 1 dose. 10/23/18 10/23/18  Nikkia Devoss, Dahlia ClientHannah, PA-C  fluticasone (FLONASE) 50 MCG/ACT nasal spray Place 1 spray into both nostrils daily. Patient taking differently: Place 1 spray into both nostrils daily as needed for allergies or rhinitis.  01/23/18   Maxwell CaulLayden, Lindsey A, PA-C  naproxen (NAPROSYN) 500 MG tablet Take 1 tablet (500 mg total) by mouth 2 (two) times daily. 05/11/18   Linwood DibblesKnapp, Jon, MD  penicillin v potassium (VEETID) 500 MG tablet Take 1 tablet (500 mg total) by mouth 4 (four) times daily for 7 days. X 7 days 10/23/18 10/30/18  Addi Pak, Dahlia ClientHannah, PA-C    Family History Family History  Problem Relation Age of Onset  . Hypertension Mother   . Cancer Father        colon  . Cancer Maternal Grandmother   . Cancer Maternal Grandfather   . Cancer Paternal Grandmother   .  Cancer Paternal Grandfather     Social History Social History   Tobacco Use  . Smoking status: Former Smoker    Packs/day: 0.25    Years: 1.00    Pack years: 0.25    Types: Cigarettes    Quit date: 06/17/2012    Years since quitting: 6.3  . Smokeless tobacco: Never Used  Substance Use Topics  . Alcohol use: Not Currently    Comment: 04/20/2018 "glass of wine qd til ~ 1 yr ago; now might have a glass of wine on major holidays"  . Drug use: Not Currently    Types: Marijuana    Comment: 04/20/2018 "nothing since ~ 2016"     Allergies   Patient has no known allergies.   Review of Systems Review of Systems  Constitutional: Negative for appetite change, chills and fever.  HENT: Positive for dental problem. Negative for drooling, ear pain, facial swelling, nosebleeds, postnasal drip, rhinorrhea and  trouble swallowing.   Eyes: Negative for pain and redness.  Respiratory: Negative for cough and wheezing.   Cardiovascular: Negative for chest pain.  Gastrointestinal: Negative for abdominal pain, nausea and vomiting.  Musculoskeletal: Negative for neck pain and neck stiffness.  Skin: Negative for color change and rash.  Neurological: Negative for weakness, light-headedness and headaches.  All other systems reviewed and are negative.    Physical Exam Updated Vital Signs BP 128/85 (BP Location: Right Arm)   Pulse 81   Temp 98.6 F (37 C) (Oral)   Resp 18   Ht 5\' 6"  (1.676 m)   Wt 116.1 kg   SpO2 100%   BMI 41.32 kg/m   Physical Exam Vitals signs and nursing note reviewed.  Constitutional:      Appearance: She is well-developed.  HENT:     Head: Normocephalic.     Right Ear: External ear normal.     Left Ear: External ear normal.     Nose: Nose normal.     Right Sinus: No maxillary sinus tenderness or frontal sinus tenderness.     Left Sinus: No maxillary sinus tenderness or frontal sinus tenderness.     Mouth/Throat:     Mouth: No lacerations or oral lesions.     Dentition: Abnormal dentition. Dental caries present.     Pharynx: Uvula midline. No oropharyngeal exudate, posterior oropharyngeal erythema or uvula swelling.     Tonsils: No tonsillar abscesses.   Eyes:     General:        Right eye: No discharge.        Left eye: No discharge.     Conjunctiva/sclera: Conjunctivae normal.     Pupils: Pupils are equal, round, and reactive to light.  Neck:     Musculoskeletal: Normal range of motion and neck supple.     Comments: No stridor Handling secretions without difficulty No nuchal rigidity No cervical lymphadenopathy  Cardiovascular:     Rate and Rhythm: Normal rate and regular rhythm.  Pulmonary:     Effort: Pulmonary effort is normal. No respiratory distress.  Abdominal:     General: There is no distension.  Lymphadenopathy:     Cervical: No cervical  adenopathy.  Skin:    General: Skin is warm and dry.  Neurological:     Mental Status: She is alert.      ED Treatments / Results   Procedures Procedures (including critical care time)  Medications Ordered in ED Medications  ketorolac (TORADOL) tablet 10 mg (10 mg Oral Given 10/23/18 0354)  penicillin v potassium (VEETID) tablet 500 mg (500 mg Oral Given 10/23/18 0354)     Initial Impression / Assessment and Plan / ED Course  I have reviewed the triage vital signs and the nursing notes.  Pertinent labs & imaging results that were available during my care of the patient were reviewed by me and considered in my medical decision making (see chart for details).        Patient with toothache.  No gross abscess.  Exam unconcerning for Ludwig's angina or spread of infection.  Will treat with penicillin and anti-inflammatories medicine.  Urged patient to follow-up with dentist.  Patient expresses concern about vaginal yeast infection after antibiotic.  Will give Diflucan.   Final Clinical Impressions(s) / ED Diagnoses   Final diagnoses:  Pain, dental  Open fracture of tooth, initial encounter    ED Discharge Orders         Ordered    penicillin v potassium (VEETID) 500 MG tablet  4 times daily     10/23/18 0411    fluconazole (DIFLUCAN) 150 MG tablet   Once     10/23/18 0415           Lysette Lindenbaum, Dahlia ClientHannah, PA-C 10/23/18 0419    Dione BoozeGlick, David, MD 10/23/18 [phone]

## 2018-11-24 ENCOUNTER — Encounter (HOSPITAL_COMMUNITY): Payer: Self-pay | Admitting: Emergency Medicine

## 2018-11-24 ENCOUNTER — Ambulatory Visit (HOSPITAL_COMMUNITY)
Admission: EM | Admit: 2018-11-24 | Discharge: 2018-11-24 | Disposition: A | Discharge status: Home / Self Care | Payer: Medicaid Other | Attending: Internal Medicine | Admitting: Internal Medicine

## 2018-11-24 ENCOUNTER — Other Ambulatory Visit: Payer: Self-pay

## 2018-11-24 DIAGNOSIS — M545 Low back pain, unspecified: Secondary | ICD-10-CM

## 2018-11-24 MED ORDER — CYCLOBENZAPRINE HCL 10 MG PO TABS: 10.0000 mg | ORAL_TABLET | Freq: Three times a day (TID) | ORAL | 0 refills | Status: DC | PRN

## 2018-11-24 MED ORDER — KETOROLAC TROMETHAMINE 30 MG/ML IJ SOLN
30.0000 mg | Freq: Once | INTRAMUSCULAR | Status: AC
  Administered 2018-11-24: 30 mg via INTRAMUSCULAR

## 2018-11-24 MED ORDER — IBUPROFEN 400 MG PO TABS: 400.0000 mg | ORAL_TABLET | Freq: Four times a day (QID) | ORAL | 0 refills | Status: DC | PRN

## 2018-11-24 MED ORDER — KETOROLAC TROMETHAMINE 30 MG/ML IJ SOLN
INTRAMUSCULAR | Status: AC
  Filled 2018-11-24: qty 1

## 2018-11-24 NOTE — ED Provider Notes (Signed)
[facility]    CSN: [id number] Arrival date & time: 11/24/18  1736      History   Chief Complaint Chief Complaint  Patient presents with  . Back Pain    HPI Claudia Landsberghyllis E Hisle is a 28 y.o. female with history of asthma comes to urgent care with complaints of sudden onset severe lower back pain this afternoon.  Last night patient slept in a hotel room and apparently she slept in a comfortable position.  Her back pain is sharp throbbing, constant and currently 8 out of 10.  Radiates into the left thigh.  It is aggravated by movement.   She denies knowledge of any relieving factors.  Patient denies any weakness in the lower extremities.  No numbness or tingling.  No loss of sensation in the perineum.  No bladder or bowel continence issues HPI  Past Medical History:  Diagnosis Date  . Anxiety   . Asthma   . Chlamydia   . Depression   . Polyarthralgia 04/20/2018  . Preterm labor 2011    Patient Active Problem List   Diagnosis Date Noted  . Bacterial vaginosis 04/22/2018  . DGI (disseminated gonococcal infection) (HCC) 04/21/2018  . Polyarthralgia 04/20/2018  . Encounter for insertion of mirena IUD 10/03/2012  . Traumatic injury during pregnancy 07/21/2012  . Supervision of high risk pregnancy in third trimester 06/13/2012  . H/O premature delivery 05/02/2012  . Anxiety and depression 05/02/2012    Past Surgical History:  Procedure Laterality Date  . APPENDECTOMY    . WISDOM TOOTH EXTRACTION      OB History    Gravida  3   Para  2   Term  1   Preterm  1   AB      Living  2     SAB      TAB      Ectopic      Multiple      Live Births  2            Home Medications    Prior to Admission medications   Medication Sig Start Date End Date Taking? Authorizing Provider  cefTRIAXone (ROCEPHIN) 500 MG injection Inject 250 mg into the muscle daily. For IM use in large muscle mass 04/25/18   Dorrell, Cathleen Cortieborah N, MD  cyclobenzaprine (FLEXERIL)  10 MG tablet Take 1 tablet (10 mg total) by mouth 3 (three) times daily as needed for muscle spasms. 11/24/18   Merrilee JanskyLamptey, Jamari Moten O, MD  fluticasone (FLONASE) 50 MCG/ACT nasal spray Place 1 spray into both nostrils daily. Patient taking differently: Place 1 spray into both nostrils daily as needed for allergies or rhinitis.  01/23/18   Maxwell CaulLayden, Lindsey A, PA-C  ibuprofen (ADVIL) 400 MG tablet Take 1 tablet (400 mg total) by mouth every 6 (six) hours as needed. 11/24/18   Merrilee JanskyLamptey, Terryon Pineiro O, MD  naproxen (NAPROSYN) 500 MG tablet Take 1 tablet (500 mg total) by mouth 2 (two) times daily. 05/11/18   Linwood DibblesKnapp, Jon, MD    Family History Family History  Problem Relation Age of Onset  . Hypertension Mother   . Cancer Father        colon  . Cancer Maternal Grandmother   . Cancer Maternal Grandfather   . Cancer Paternal Grandmother   . Cancer Paternal Grandfather     Social History Social History   Tobacco Use  . Smoking status: Former Smoker    Packs/day: 0.25    Years: 1.00  Pack years: 0.25    Types: Cigarettes    Quit date: 06/17/2012    Years since quitting: 6.4  . Smokeless tobacco: Never Used  Substance Use Topics  . Alcohol use: Not Currently    Comment: 04/20/2018 "glass of wine qd til ~ 1 yr ago; now might have a glass of wine on major holidays"  . Drug use: Not Currently    Types: Marijuana    Comment: 04/20/2018 "nothing since ~ 2016"     Allergies   Patient has no known allergies.   Review of Systems Review of Systems  Constitutional: Negative for activity change, chills, fatigue and fever.  HENT: Negative.   Respiratory: Negative for cough, shortness of breath and wheezing.   Cardiovascular: Negative.   Gastrointestinal: Negative for abdominal pain, diarrhea, nausea and vomiting.  Genitourinary: Negative for dysuria, enuresis, genital sores, pelvic pain and urgency.  Musculoskeletal: Positive for arthralgias and back pain. Negative for gait problem, joint swelling and  myalgias.  Skin: Negative for rash and wound.  Neurological: Negative for dizziness, syncope, weakness, light-headedness, numbness and headaches.     Physical Exam Triage Vital Signs ED Triage Vitals  Enc Vitals Group     BP      Pulse      Resp      Temp      Temp src      SpO2      Weight      Height      Head Circumference      Peak Flow      Pain Score      Pain Loc      Pain Edu?      Excl. in GC?    No data found.  Updated Vital Signs BP 104/70   Pulse 80   Temp 98.5 F (36.9 C) (Oral)   Resp 16   LMP 11/21/2018   SpO2 98%   Visual Acuity Right Eye Distance:   Left Eye Distance:   Bilateral Distance:    Right Eye Near:   Left Eye Near:    Bilateral Near:     Physical Exam Constitutional:      General: She is in acute distress.     Appearance: She is not ill-appearing.  Neck:     Musculoskeletal: Normal range of motion.  Cardiovascular:     Rate and Rhythm: Normal rate and regular rhythm.  Pulmonary:     Effort: Pulmonary effort is normal.  Abdominal:     General: Bowel sounds are normal. There is no distension.     Tenderness: There is no abdominal tenderness.  Musculoskeletal: Normal range of motion.        General: No swelling or deformity.  Skin:    General: Skin is warm.     Capillary Refill: Capillary refill takes less than 2 seconds.     Coloration: Skin is not jaundiced.     Findings: No bruising or erythema.  Neurological:     General: No focal deficit present.     Mental Status: She is alert and oriented to person, place, and time.     Cranial Nerves: No cranial nerve deficit.     Sensory: No sensory deficit.     Motor: No weakness.     Coordination: Coordination normal.     Deep Tendon Reflexes: Reflexes normal.      UC Treatments / Results  Labs (all labs ordered are listed, but only abnormal results are displayed) Labs Reviewed -  No data to display  EKG   Radiology No results found.  Procedures Procedures  (including critical care time)  Medications Ordered in UC Medications  ketorolac (TORADOL) 30 MG/ML injection 30 mg (30 mg Intramuscular Given 11/24/18 1853)  ketorolac (TORADOL) 30 MG/ML injection (has no administration in time range)    Initial Impression / Assessment and Plan / UC Course  I have reviewed the triage vital signs and the nursing notes.  Pertinent labs & imaging results that were available during my care of the patient were reviewed by me and considered in my medical decision making (see chart for details).     1.  Acute back pain likely musculoskeletal: Toradol 30 mg IM x1 Patient advised to apply heating pad over the lower back for 5-7 minutes at a time. Gentle stretches Naproxen 400 mg every 6 hours as needed pain Flexeril 10 mg 3 times daily as needed for muscle spasms There are no signs of neuro deficits.  If patient experiences worsening pain or weakness in the lower extremities she is advised to go to the emergency department to be evaluated. Patient was referred to the [facility] to establish primary care  Final Clinical Impressions(s) / UC Diagnoses   Final diagnoses:  Acute bilateral low back pain without sciatica   Discharge Instructions   None    ED Prescriptions    Medication Sig Dispense Auth. Provider   cyclobenzaprine (FLEXERIL) 10 MG tablet Take 1 tablet (10 mg total) by mouth 3 (three) times daily as needed for muscle spasms. 20 tablet Shanise Balch, Myrene Galas, MD   ibuprofen (ADVIL) 400 MG tablet Take 1 tablet (400 mg total) by mouth every 6 (six) hours as needed. 30 tablet Hillery Bhalla, Myrene Galas, MD     Controlled Substance Prescriptions [place] Controlled Substance Registry consulted? No   Chase Picket, MD 11/24/18 [phone]

## 2018-11-24 NOTE — ED Triage Notes (Signed)
PT reports back pain that started today suddenly. PT denies injury. At 2pm PT reports a "nerve-like" "throbbing" pain in back, pelvis, and radiates down to knees. PT also reports she involuntarily urinated on herself shortly after pain began.

## 2019-02-19 ENCOUNTER — Other Ambulatory Visit: Payer: Self-pay

## 2019-02-19 ENCOUNTER — Ambulatory Visit (HOSPITAL_COMMUNITY)
Admission: EM | Admit: 2019-02-19 | Discharge: 2019-02-19 | Disposition: A | Discharge status: Home / Self Care | Payer: Medicaid Other | Attending: Emergency Medicine | Admitting: Emergency Medicine

## 2019-02-19 ENCOUNTER — Encounter (HOSPITAL_COMMUNITY): Payer: Self-pay | Admitting: *Deleted

## 2019-02-19 DIAGNOSIS — J069 Acute upper respiratory infection, unspecified: Secondary | ICD-10-CM | POA: Diagnosis not present

## 2019-02-19 DIAGNOSIS — U071 COVID-19: Secondary | ICD-10-CM | POA: Insufficient documentation

## 2019-02-19 DIAGNOSIS — R197 Diarrhea, unspecified: Secondary | ICD-10-CM | POA: Diagnosis present

## 2019-02-19 DIAGNOSIS — Z20822 Contact with and (suspected) exposure to covid-19: Secondary | ICD-10-CM

## 2019-02-19 DIAGNOSIS — Z20828 Contact with and (suspected) exposure to other viral communicable diseases: Secondary | ICD-10-CM | POA: Diagnosis present

## 2019-02-19 MED ORDER — IBUPROFEN 800 MG PO TABS
800.0000 mg | ORAL_TABLET | Freq: Once | ORAL | Status: AC
  Administered 2019-02-19: 800 mg via ORAL

## 2019-02-19 MED ORDER — IBUPROFEN 800 MG PO TABS
ORAL_TABLET | ORAL | Status: AC
  Filled 2019-02-19: qty 1

## 2019-02-19 MED ORDER — ONDANSETRON 4 MG PO TBDP: 4.0000 mg | ORAL_TABLET | Freq: Three times a day (TID) | ORAL | 0 refills | Status: DC | PRN

## 2019-02-19 MED ORDER — ONDANSETRON 4 MG PO TBDP
ORAL_TABLET | ORAL | Status: AC
  Filled 2019-02-19: qty 1

## 2019-02-19 MED ORDER — ONDANSETRON 4 MG PO TBDP
4.0000 mg | ORAL_TABLET | Freq: Once | ORAL | Status: AC
  Administered 2019-02-19: 4 mg via ORAL

## 2019-02-19 NOTE — ED Triage Notes (Addendum)
Rn called to registration desk for pt c/o "feeling like I'm going to pass out".  Skin W/D/P.  States feels like she's having an anxiety attack - reports hx. C/O cough x 1 day.  Denies any wheezing.  Also c/o HA. Pt talking in calm, complete sentences with no apparent distress. Pt notified her VS are WNL.  Pt states she's feeling a little better than in registration area.  Instructed to sit in [place] and to notify staff member if any sxs change.  Pt verbalized understanding.

## 2019-02-19 NOTE — Discharge Instructions (Addendum)
Small frequent sips of fluids- Pedialyte, Gatorade, water, broth- to maintain hydration.   Zofran every 8 hours as needed for nausea or vomiting.   Bland diet as tolerated. Over the counter  medications as needed for symptoms.  Tylenol and/or ibuprofen as needed for pain or fevers.   Self isolate until covid results are back and negative.  Will notify you by phone of any positive findings. Your negative results will be sent through your MyChart.          Person Under Monitoring Name: Claudia Gonzales  Location: [address] [place] [id number]   Infection Prevention Recommendations for Individuals Confirmed to have, or Being Evaluated for, 2019 Novel Coronavirus (COVID-19) Infection Who Receive Care at Home  Individuals who are confirmed to have, or are being evaluated for, COVID-19 should follow the prevention steps below until a healthcare provider or local or state health department says they can return to normal activities.  Stay home except to get medical care You should restrict activities outside your home, except for getting medical care. Do not go to work, school, or public areas, and do not use public transportation or taxis.  Call ahead before visiting your doctor Before your medical appointment, call the healthcare provider and tell them that you have, or are being evaluated for, COVID-19 infection. This will help the healthcare providers office take steps to keep other people from getting infected. Ask your healthcare provider to call the local or state health department.  Monitor your symptoms Seek prompt medical attention if your illness is worsening (e.g., difficulty breathing). Before going to your medical appointment, call the healthcare provider and tell them that you have, or are being evaluated for, COVID-19 infection. Ask your healthcare provider to call the local or state health department.  Wear a facemask You should wear a facemask  that covers your nose and mouth when you are in the same room with other people and when you visit a healthcare provider. People who live with or visit you should also wear a facemask while they are in the same room with you.  Separate yourself from other people in your home As much as possible, you should stay in a different room from other people in your home. Also, you should use a separate bathroom, if available.  Avoid sharing household items You should not share dishes, drinking glasses, cups, eating utensils, towels, bedding, or other items with other people in your home. After using these items, you should wash them thoroughly with soap and water.  Cover your coughs and sneezes Cover your mouth and nose with a tissue when you cough or sneeze, or you can cough or sneeze into your sleeve. Throw used tissues in a lined trash can, and immediately wash your hands with soap and water for at least 20 seconds or use an alcohol-based hand rub.  Wash your [employer] Wash your hands often and thoroughly with soap and water for at least 20 seconds. You can use an alcohol-based hand sanitizer if soap and water are not available and if your hands are not visibly dirty. Avoid touching your eyes, nose, and mouth with unwashed hands.   Prevention Steps for Caregivers and Household Members of Individuals Confirmed to have, or Being Evaluated for, COVID-19 Infection Being Cared for in the Home  If you live with, or provide care at home for, a person confirmed to have, or being evaluated for, COVID-19 infection please follow these guidelines to prevent infection:  Follow healthcare providers instructions Make sure that you understand and can help the patient follow any healthcare provider instructions for all care.  Provide for the patients basic needs You should help the patient with basic needs in the home and provide support for getting groceries, prescriptions, and other personal  needs.  Monitor the patients symptoms If they are getting sicker, call his or her medical provider and tell them that the patient has, or is being evaluated for, COVID-19 infection. This will help the healthcare providers office take steps to keep other people from getting infected. Ask the healthcare provider to call the local or state health department.  Limit the number of people who have contact with the patient If possible, have only one caregiver for the patient. Other household members should stay in another home or place of residence. If this is not possible, they should stay in another room, or be separated from the patient as much as possible. Use a separate bathroom, if available. Restrict visitors who do not have an essential need to be in the home.  Keep older adults, very young children, and other sick people away from the patient Keep older adults, very young children, and those who have compromised immune systems or chronic health conditions away from the patient. This includes people with chronic heart, lung, or kidney conditions, diabetes, and cancer.  Ensure good ventilation Make sure that shared spaces in the home have good air flow, such as from an air conditioner or an opened window, weather permitting.  Wash your hands often Wash your hands often and thoroughly with soap and water for at least 20 seconds. You can use an alcohol based hand sanitizer if soap and water are not available and if your hands are not visibly dirty. Avoid touching your eyes, nose, and mouth with unwashed hands. Use disposable paper towels to dry your hands. If not available, use dedicated cloth towels and replace them when they become wet.  Wear a facemask and gloves Wear a disposable facemask at all times in the room and gloves when you touch or have contact with the patients blood, body fluids, and/or secretions or excretions, such as sweat, saliva, sputum, nasal mucus, vomit, urine, or  feces.  Ensure the mask fits over your nose and mouth tightly, and do not touch it during use. Throw out disposable facemasks and gloves after using them. Do not reuse. Wash your hands immediately after removing your facemask and gloves. If your personal clothing becomes contaminated, carefully remove clothing and launder. Wash your hands after handling contaminated clothing. Place all used disposable facemasks, gloves, and other waste in a lined container before disposing them with other household waste. Remove gloves and wash your hands immediately after handling these items.  Do not share dishes, glasses, or other household items with the patient Avoid sharing household items. You should not share dishes, drinking glasses, cups, eating utensils, towels, bedding, or other items with a patient who is confirmed to have, or being evaluated for, COVID-19 infection. After the person uses these items, you should wash them thoroughly with soap and water.  Wash laundry thoroughly Immediately remove and wash clothes or bedding that have blood, body fluids, and/or secretions or excretions, such as sweat, saliva, sputum, nasal mucus, vomit, urine, or feces, on them. Wear gloves when handling laundry from the patient. Read and follow directions on labels of laundry or clothing items and detergent. In general, wash and dry with the warmest temperatures recommended on the label.  Clean all areas the individual has used often Clean all touchable surfaces, such as counters, tabletops, doorknobs, bathroom fixtures, toilets, phones, keyboards, tablets, and bedside tables, every day. Also, clean any surfaces that may have blood, body fluids, and/or secretions or excretions on them. Wear gloves when cleaning surfaces the patient has come in contact with. Use a diluted bleach solution (e.g., dilute bleach with 1 part bleach and 10 parts water) or a household disinfectant with a label that says EPA-registered for  coronaviruses. To make a bleach solution at home, add 1 tablespoon of bleach to 1 quart (4 cups) of water. For a larger supply, add  cup of bleach to 1 gallon (16 cups) of water. Read labels of cleaning products and follow recommendations provided on product labels. Labels contain instructions for safe and effective use of the cleaning product including precautions you should take when applying the product, such as wearing gloves or eye protection and making sure you have good ventilation during use of the product. Remove gloves and wash hands immediately after cleaning.  Monitor yourself for signs and symptoms of illness Caregivers and household members are considered close contacts, should monitor their health, and will be asked to limit movement outside of the home to the extent possible. Follow the monitoring steps for close contacts listed on the symptom monitoring form.   ? If you have additional questions, contact your local health department or call the epidemiologist on call at [phone] (available 24/7). ? This guidance is subject to change. For the most up-to-date guidance from [facility], please refer to their website: [email or web address]

## 2019-02-19 NOTE — ED Triage Notes (Signed)
Pt c/o poor appetite, diarrhea since yesterday.  Also c/o "bad HA", nasal pain, chills since yesterday.  Has not taken any meds today.

## 2019-02-19 NOTE — ED Provider Notes (Signed)
[facility]    CSN: [id number] Arrival date & time: 02/19/19  1332      History   Chief Complaint Chief Complaint  Patient presents with  . Headache  . Cough    HPI Claudia Gonzales is a 28 y.o. female.   Claudia Gonzales presents with complaints of cough with mucus production, runny nose, headache, sweaty last night, body aches, diarrhea since yesterday, with mild upset stomach. Symptoms started yesterday. No known fevers. Chest pain  Earlier today but this has resolved. No shortness of breath . She is not on any hormone medications. No recent immobilization. Former smoker. Cough is productive. Hasn't taken any medications for symptoms. Possible covid exposure of her friend. Mild nausea. Has been able to drink fluids today. Normal urination. No blood or black in diarrhea. She is on her menstrual period. Approximately 6 stools today. Has felt somewhat weak. Activity makes her tired. Patient packs for [facility].   ROS per HPI, negative if not otherwise mentioned.      Past Medical History:  Diagnosis Date  . Anxiety   . Asthma   . Chlamydia   . Depression   . Polyarthralgia 04/20/2018  . Preterm labor 2011    Patient Active Problem List   Diagnosis Date Noted  . Bacterial vaginosis 04/22/2018  . DGI (disseminated gonococcal infection) (HCC) 04/21/2018  . Polyarthralgia 04/20/2018  . Encounter for insertion of mirena IUD 10/03/2012  . Traumatic injury during pregnancy 07/21/2012  . Supervision of high risk pregnancy in third trimester 06/13/2012  . H/O premature delivery 05/02/2012  . Anxiety and depression 05/02/2012    Past Surgical History:  Procedure Laterality Date  . APPENDECTOMY    . WISDOM TOOTH EXTRACTION      OB History    Gravida  3   Para  2   Term  1   Preterm  1   AB      Living  2     SAB      TAB      Ectopic      Multiple      Live Births  2            Home Medications    Prior to Admission medications    Medication Sig Start Date End Date Taking? Authorizing Provider  cefTRIAXone (ROCEPHIN) 500 MG injection Inject 250 mg into the muscle daily. For IM use in large muscle mass 04/25/18   Dorrell, Cathleen Corti, MD  cyclobenzaprine (FLEXERIL) 10 MG tablet Take 1 tablet (10 mg total) by mouth 3 (three) times daily as needed for muscle spasms. 11/24/18   Merrilee Jansky, MD  fluticasone (FLONASE) 50 MCG/ACT nasal spray Place 1 spray into both nostrils daily. Patient taking differently: Place 1 spray into both nostrils daily as needed for allergies or rhinitis.  01/23/18   Maxwell Caul, PA-C  ibuprofen (ADVIL) 400 MG tablet Take 1 tablet (400 mg total) by mouth every 6 (six) hours as needed. 11/24/18   Merrilee Jansky, MD  naproxen (NAPROSYN) 500 MG tablet Take 1 tablet (500 mg total) by mouth 2 (two) times daily. 05/11/18   Linwood Dibbles, MD  ondansetron (ZOFRAN-ODT) 4 MG disintegrating tablet Take 1 tablet (4 mg total) by mouth every 8 (eight) hours as needed for nausea or vomiting. 02/19/19   Georgetta Haber, NP    Family History Family History  Problem Relation Age of Onset  . Hypertension Mother   . Cancer Father  colon  . Cancer Maternal Grandmother   . Cancer Maternal Grandfather   . Cancer Paternal Grandmother   . Cancer Paternal Grandfather     Social History Social History   Tobacco Use  . Smoking status: Former Smoker    Packs/day: 0.25    Years: 1.00    Pack years: 0.25    Types: Cigarettes    Quit date: 06/17/2012    Years since quitting: 6.6  . Smokeless tobacco: Never Used  Substance Use Topics  . Alcohol use: Not Currently  . Drug use: Not Currently    Types: Marijuana     Allergies   Patient has no known allergies.   Review of Systems Review of Systems   Physical Exam Triage Vital Signs ED Triage Vitals  Enc Vitals Group     BP 02/19/19 1346 128/70     Pulse Rate 02/19/19 1346 91     Resp 02/19/19 1346 16     Temp 02/19/19 1452 97.7 F (36.5 C)      Temp Source 02/19/19 1452 Other     SpO2 02/19/19 1346 100 %     Weight --      Height --      Head Circumference --      Peak Flow --      Pain Score 02/19/19 1450 7     Pain Loc --      Pain Edu? --      Excl. in GC? --    No data found.  Updated Vital Signs BP 128/70   Pulse 91   Temp 97.7 F (36.5 C) (Other (Comment))   Resp 16   LMP 02/17/2019 (Exact Date)   SpO2 100%   Breastfeeding No    Physical Exam Constitutional:      General: She is not in acute distress.    Appearance: She is well-developed.     Comments: Patient feels "like she will pass out" with lung assessment  Cardiovascular:     Rate and Rhythm: Normal rate and regular rhythm.     Heart sounds: Normal heart sounds.  Pulmonary:     Effort: Pulmonary effort is normal. No respiratory distress.     Breath sounds: Normal breath sounds.  Skin:    General: Skin is warm and dry.  Neurological:     Mental Status: She is alert and oriented to person, place, and time.      UC Treatments / Results  Labs (all labs ordered are listed, but only abnormal results are displayed) Labs Reviewed  NOVEL CORONAVIRUS, NAA (HOSP ORDER, SEND-OUT TO REF LAB; TAT 18-24 HRS)    EKG   Radiology No results found.  Procedures Procedures (including critical care time)  Medications Ordered in UC Medications  ibuprofen (ADVIL) tablet 800 mg (800 mg Oral Given 02/19/19 1514)  ondansetron (ZOFRAN-ODT) disintegrating tablet 4 mg (4 mg Oral Given 02/19/19 1514)  ibuprofen (ADVIL) 800 MG tablet (has no administration in time range)  ondansetron (ZOFRAN-ODT) 4 MG disintegrating tablet (has no administration in time range)    Initial Impression / Assessment and Plan / UC Course  I have reviewed the triage vital signs and the nursing notes.  Pertinent labs & imaging results that were available during my care of the patient were reviewed by me and considered in my medical decision making (see chart for details).      Afebrile. No increased work of breathing. No tachycardia, tachypnea or hypoxia. Low risk for PE. Patient endorses feeling  flu like. Possible covid exposure with testing collected and pending. This does seem highly suspicious of covid. Isolation precautions provided. Supportive cares recommended. Return precautions provided. Patient verbalized understanding and agreeable to plan.   Final Clinical Impressions(s) / UC Diagnoses   Final diagnoses:  Viral upper respiratory tract infection  Diarrhea, unspecified type  Suspected COVID-19 virus infection     Discharge Instructions      Small frequent sips of fluids- Pedialyte, Gatorade, water, broth- to maintain hydration.   Zofran every 8 hours as needed for nausea or vomiting.   Bland diet as tolerated. Over the counter  medications as needed for symptoms.  Tylenol and/or ibuprofen as needed for pain or fevers.   Self isolate until covid results are back and negative.  Will notify you by phone of any positive findings. Your negative results will be sent through your MyChart.          Person Under Monitoring Name: Jalana Moore Laflamme  Location: [address] [place] [address]   Infection Prevention Recommendations for Individuals Confirmed to have, or Being Evaluated for, 2019 Novel Coronavirus (COVID-19) Infection Who Receive Care at Home  Individuals who are confirmed to have, or are being evaluated for, COVID-19 should follow the prevention steps below until a healthcare provider or local or state health department says they can return to normal activities.  Stay home except to get medical care You should restrict activities outside your home, except for getting medical care. Do not go to work, school, or public areas, and do not use public transportation or taxis.  Call ahead before visiting your doctor Before your medical appointment, call the healthcare provider and tell them that you have, or are being  evaluated for, COVID-19 infection. This will help the healthcare provider's office take steps to keep other people from getting infected. Ask your healthcare provider to call the local or state health department.  Monitor your symptoms Seek prompt medical attention if your illness is worsening (e.g., difficulty breathing). Before going to your medical appointment, call the healthcare provider and tell them that you have, or are being evaluated for, COVID-19 infection. Ask your healthcare provider to call the local or state health department.  Wear a facemask You should wear a facemask that covers your nose and mouth when you are in the same room with other people and when you visit a healthcare provider. People who live with or visit you should also wear a facemask while they are in the same room with you.  Separate yourself from other people in your home As much as possible, you should stay in a different room from other people in your home. Also, you should use a separate bathroom, if available.  Avoid sharing household items You should not share dishes, drinking glasses, cups, eating utensils, towels, bedding, or other items with other people in your home. After using these items, you should wash them thoroughly with soap and water.  Cover your coughs and sneezes Cover your mouth and nose with a tissue when you cough or sneeze, or you can cough or sneeze into your sleeve. Throw used tissues in a lined trash can, and immediately wash your hands with soap and water for at least 20 seconds or use an alcohol-based hand rub.  Wash your [employer] your hands often and thoroughly with soap and water for at least 20 seconds. You can use an alcohol-based hand sanitizer if soap and water are not available and if  your hands are not visibly dirty. Avoid touching your eyes, nose, and mouth with unwashed hands.   Prevention Steps for Caregivers and Household Members of Individuals Confirmed to  have, or Being Evaluated for, COVID-19 Infection Being Cared for in the Home  If you live with, or provide care at home for, a person confirmed to have, or being evaluated for, COVID-19 infection please follow these guidelines to prevent infection:  Follow healthcare provider's instructions Make sure that you understand and can help the patient follow any healthcare provider instructions for all care.  Provide for the patient's basic needs You should help the patient with basic needs in the home and provide support for getting groceries, prescriptions, and other personal needs.  Monitor the patient's symptoms If they are getting sicker, call his or her medical provider and tell them that the patient has, or is being evaluated for, COVID-19 infection. This will help the healthcare provider's office take steps to keep other people from getting infected. Ask the healthcare provider to call the local or state health department.  Limit the number of people who have contact with the patient  If possible, have only one caregiver for the patient.  Other household members should stay in another home or place of residence. If this is not possible, they should stay  in another room, or be separated from the patient as much as possible. Use a separate bathroom, if available.  Restrict visitors who do not have an essential need to be in the home.  Keep older adults, very young children, and other sick people away from the patient Keep older adults, very young children, and those who have compromised immune systems or chronic health conditions away from the patient. This includes people with chronic heart, lung, or kidney conditions, diabetes, and cancer.  Ensure good ventilation Make sure that shared spaces in the home have good air flow, such as from an air conditioner or an opened window, weather permitting.  Wash your hands often  Wash your hands often and thoroughly with soap and water for  at least 20 seconds. You can use an alcohol based hand sanitizer if soap and water are not available and if your hands are not visibly dirty.  Avoid touching your eyes, nose, and mouth with unwashed hands.  Use disposable paper towels to dry your hands. If not available, use dedicated cloth towels and replace them when they become wet.  Wear a facemask and gloves  Wear a disposable facemask at all times in the room and gloves when you touch or have contact with the patient's blood, body fluids, and/or secretions or excretions, such as sweat, saliva, sputum, nasal mucus, vomit, urine, or feces.  Ensure the mask fits over your nose and mouth tightly, and do not touch it during use.  Throw out disposable facemasks and gloves after using them. Do not reuse.  Wash your hands immediately after removing your facemask and gloves.  If your personal clothing becomes contaminated, carefully remove clothing and launder. Wash your hands after handling contaminated clothing.  Place all used disposable facemasks, gloves, and other waste in a lined container before disposing them with other household waste.  Remove gloves and wash your hands immediately after handling these items.  Do not share dishes, glasses, or other household items with the patient  Avoid sharing household items. You should not share dishes, drinking glasses, cups, eating utensils, towels, bedding, or other items with a patient who is confirmed to have, or being evaluated  for, COVID-19 infection.  After the person uses these items, you should wash them thoroughly with soap and water.  Wash laundry thoroughly  Immediately remove and wash clothes or bedding that have blood, body fluids, and/or secretions or excretions, such as sweat, saliva, sputum, nasal mucus, vomit, urine, or feces, on them.  Wear gloves when handling laundry from the patient.  Read and follow directions on labels of laundry or clothing items and detergent. In  general, wash and dry with the warmest temperatures recommended on the label.  Clean all areas the individual has used often  Clean all touchable surfaces, such as counters, tabletops, doorknobs, bathroom fixtures, toilets, phones, keyboards, tablets, and bedside tables, every day. Also, clean any surfaces that may have blood, body fluids, and/or secretions or excretions on them.  Wear gloves when cleaning surfaces the patient has come in contact with.  Use a diluted bleach solution (e.g., dilute bleach with 1 part bleach and 10 parts water) or a household disinfectant with a label that says EPA-registered for coronaviruses. To make a bleach solution at home, add 1 tablespoon of bleach to 1 quart (4 cups) of water. For a larger supply, add  cup of bleach to 1 gallon (16 cups) of water.  Read labels of cleaning products and follow recommendations provided on product labels. Labels contain instructions for safe and effective use of the cleaning product including precautions you should take when applying the product, such as wearing gloves or eye protection and making sure you have good ventilation during use of the product.  Remove gloves and wash hands immediately after cleaning.  Monitor yourself for signs and symptoms of illness Caregivers and household members are considered close contacts, should monitor their health, and will be asked to limit movement outside of the home to the extent possible. Follow the monitoring steps for close contacts listed on the symptom monitoring form.   ? If you have additional questions, contact your local health department or call the epidemiologist on call at [phone] (available 24/7). ? This guidance is subject to change. For the most up-to-date guidance from [facility], please refer to their website: TripMetro.huhttps://www.cdc.gov/coronavirus/2019-ncov/hcp/guidance-prevent-spread.html     ED Prescriptions    Medication Sig Dispense Auth. Provider   ondansetron  (ZOFRAN-ODT) 4 MG disintegrating tablet Take 1 tablet (4 mg total) by mouth every 8 (eight) hours as needed for nausea or vomiting. 12 tablet Georgetta HaberBurky, Laityn Bensen B, NP     PDMP not reviewed this encounter.   Georgetta HaberBurky, Arvon Schreiner B, NP 02/19/19 1517

## 2019-02-20 ENCOUNTER — Telehealth: Payer: Self-pay | Admitting: Emergency Medicine

## 2019-02-20 ENCOUNTER — Encounter (HOSPITAL_COMMUNITY): Payer: Self-pay

## 2019-02-20 LAB — NOVEL CORONAVIRUS, NAA (HOSP ORDER, SEND-OUT TO REF LAB; TAT 18-24 HRS): SARS-CoV-2, NAA: DETECTED — AB

## 2019-02-20 NOTE — Telephone Encounter (Signed)
Positive covid detected on sample. Attempted to reach patient. No answer at this time. Voicemail left.

## 2019-02-21 ENCOUNTER — Other Ambulatory Visit: Payer: Self-pay

## 2019-02-21 ENCOUNTER — Emergency Department (HOSPITAL_COMMUNITY): Payer: Medicaid Other

## 2019-02-21 ENCOUNTER — Emergency Department (HOSPITAL_COMMUNITY)
Admission: EM | Admit: 2019-02-21 | Discharge: 2019-02-21 | Disposition: A | Discharge status: Home / Self Care | Payer: Medicaid Other | Attending: Emergency Medicine | Admitting: Emergency Medicine

## 2019-02-21 ENCOUNTER — Telehealth (HOSPITAL_COMMUNITY): Payer: Self-pay | Admitting: Internal Medicine

## 2019-02-21 ENCOUNTER — Encounter (HOSPITAL_COMMUNITY): Payer: Self-pay

## 2019-02-21 DIAGNOSIS — Z791 Long term (current) use of non-steroidal anti-inflammatories (NSAID): Secondary | ICD-10-CM | POA: Diagnosis not present

## 2019-02-21 DIAGNOSIS — U071 COVID-19: Secondary | ICD-10-CM | POA: Insufficient documentation

## 2019-02-21 DIAGNOSIS — J45909 Unspecified asthma, uncomplicated: Secondary | ICD-10-CM | POA: Insufficient documentation

## 2019-02-21 DIAGNOSIS — R0602 Shortness of breath: Secondary | ICD-10-CM | POA: Diagnosis present

## 2019-02-21 DIAGNOSIS — Z79899 Other long term (current) drug therapy: Secondary | ICD-10-CM | POA: Diagnosis not present

## 2019-02-21 DIAGNOSIS — Z87891 Personal history of nicotine dependence: Secondary | ICD-10-CM | POA: Diagnosis not present

## 2019-02-21 DIAGNOSIS — J189 Pneumonia, unspecified organism: Secondary | ICD-10-CM | POA: Diagnosis not present

## 2019-02-21 MED ORDER — BENZONATATE 100 MG PO CAPS: 100.0000 mg | ORAL_CAPSULE | Freq: Three times a day (TID) | ORAL | 0 refills | Status: DC | PRN

## 2019-02-21 MED ORDER — ALBUTEROL SULFATE HFA 108 (90 BASE) MCG/ACT IN AERS: 1.0000 | INHALATION_SPRAY | Freq: Four times a day (QID) | RESPIRATORY_TRACT | 0 refills | Status: DC | PRN

## 2019-02-21 MED ORDER — ALBUTEROL SULFATE HFA 108 (90 BASE) MCG/ACT IN AERS: 1.0000 | INHALATION_SPRAY | Freq: Four times a day (QID) | RESPIRATORY_TRACT | 1 refills | Status: DC | PRN

## 2019-02-21 MED ORDER — ALBUTEROL SULFATE HFA 108 (90 BASE) MCG/ACT IN AERS
2.0000 | INHALATION_SPRAY | Freq: Once | RESPIRATORY_TRACT | Status: AC
  Administered 2019-02-21: 2 via RESPIRATORY_TRACT
  Filled 2019-02-21: qty 6.7

## 2019-02-21 MED ORDER — PREDNISONE 10 MG PO TABS: 40.0000 mg | ORAL_TABLET | Freq: Every day | ORAL | 0 refills | Status: DC

## 2019-02-21 MED ORDER — AZITHROMYCIN 250 MG PO TABS
500.0000 mg | ORAL_TABLET | Freq: Once | ORAL | Status: AC
  Administered 2019-02-21: 500 mg via ORAL
  Filled 2019-02-21: qty 2

## 2019-02-21 MED ORDER — ONDANSETRON 4 MG PO TBDP
4.0000 mg | ORAL_TABLET | Freq: Once | ORAL | Status: AC
  Administered 2019-02-21: 4 mg via ORAL
  Filled 2019-02-21: qty 1

## 2019-02-21 MED ORDER — PREDNISONE 20 MG PO TABS
40.0000 mg | ORAL_TABLET | Freq: Once | ORAL | Status: AC
  Administered 2019-02-21: 40 mg via ORAL
  Filled 2019-02-21: qty 2

## 2019-02-21 MED ORDER — AZITHROMYCIN 250 MG PO TABS: 250.0000 mg | ORAL_TABLET | Freq: Every day | ORAL | 0 refills | Status: DC

## 2019-02-21 NOTE — Telephone Encounter (Signed)
Pt requesting inhaler

## 2019-02-21 NOTE — Telephone Encounter (Signed)
Patient called urgent care requesting albuterol inhaler refill. Patient tested positive for covid-19 on 02/19/2019. She has a history of asthma and lost her inhaler last week. She is requesting refill in case she needs it.

## 2019-02-21 NOTE — ED Triage Notes (Signed)
Patient arrived stating that on Saturday she was diagnosed with Covid-19. Reports feeling short of breath, nausea, a cough, loss of appetite, and diarrhea. Patient has asthma and reporting taking her Albuterol inhaler once today and an ibuprofen. Was given Zofran but has not taken it yet.

## 2019-02-21 NOTE — ED Provider Notes (Signed)
[facility]-EMERGENCY DEPT Provider Note   CSN: [id number] Arrival date & time: 02/21/19  1923     History   Chief Complaint Chief Complaint  Patient presents with  . Shortness of Breath    HPI Claudia Gonzales is a 28 y.o. female.     The history is provided by the patient and medical records. No language interpreter was used.  Shortness of Breath  Claudia Gonzales is a 28 y.o. female who presents to the Emergency Department complaining of shortness of breath, history of asthma. She presents the emergency department complaining of increase shortness of breath similar to her prior asthma exacerbations starting on Saturday of this week. She was diagnosed with COVID-19 infection on Saturday. She complains of body aches, nausea, poor appetite, diarrhea, cough. No fevers, chest pain, leg swelling. She was prescribed Zofran but has not taken this medication. She presents the emergency department because of persistent shortness of breath. She does use an albuterol MDI at home and used it once today. Symptoms are moderate and constant nature. Past Medical History:  Diagnosis Date  . Anxiety   . Asthma   . Chlamydia   . Depression   . Polyarthralgia 04/20/2018  . Preterm labor 2011    Patient Active Problem List   Diagnosis Date Noted  . Bacterial vaginosis 04/22/2018  . DGI (disseminated gonococcal infection) (HCC) 04/21/2018  . Polyarthralgia 04/20/2018  . Encounter for insertion of mirena IUD 10/03/2012  . Traumatic injury during pregnancy 07/21/2012  . Supervision of high risk pregnancy in third trimester 06/13/2012  . H/O premature delivery 05/02/2012  . Anxiety and depression 05/02/2012    Past Surgical History:  Procedure Laterality Date  . APPENDECTOMY    . WISDOM TOOTH EXTRACTION       OB History    Gravida  3   Para  2   Term  1   Preterm  1   AB      Living  2     SAB      TAB      Ectopic      Multiple      Live  Births  2            Home Medications    Prior to Admission medications   Medication Sig Start Date End Date Taking? Authorizing Provider  albuterol (VENTOLIN HFA) 108 (90 Base) MCG/ACT inhaler Inhale 1-2 puffs into the lungs every 6 (six) hours as needed for wheezing or shortness of breath. 02/21/19   Lamptey, Britta MccreedyPhilip O, MD  azithromycin (ZITHROMAX) 250 MG tablet Take 1 tablet (250 mg total) by mouth daily. Take 1 tablet daily 02/21/19   Tilden Fossaees, Verner Kopischke, MD  benzonatate (TESSALON) 100 MG capsule Take 1 capsule (100 mg total) by mouth 3 (three) times daily as needed for cough. 02/21/19   Tilden Fossaees, Johnatan Baskette, MD  cefTRIAXone (ROCEPHIN) 500 MG injection Inject 250 mg into the muscle daily. For IM use in large muscle mass 04/25/18   Dorrell, Cathleen Cortieborah N, MD  cyclobenzaprine (FLEXERIL) 10 MG tablet Take 1 tablet (10 mg total) by mouth 3 (three) times daily as needed for muscle spasms. 11/24/18   Merrilee JanskyLamptey, Philip O, MD  fluticasone (FLONASE) 50 MCG/ACT nasal spray Place 1 spray into both nostrils daily. Patient taking differently: Place 1 spray into both nostrils daily as needed for allergies or rhinitis.  01/23/18   Maxwell CaulLayden, Lindsey A, PA-C  ibuprofen (ADVIL) 400 MG tablet Take 1 tablet (400 mg total)  by mouth every 6 (six) hours as needed. 11/24/18   Chase Picket, MD  naproxen (NAPROSYN) 500 MG tablet Take 1 tablet (500 mg total) by mouth 2 (two) times daily. 05/11/18   Dorie Rank, MD  ondansetron (ZOFRAN-ODT) 4 MG disintegrating tablet Take 1 tablet (4 mg total) by mouth every 8 (eight) hours as needed for nausea or vomiting. 02/19/19   Zigmund Gottron, NP  predniSONE (DELTASONE) 10 MG tablet Take 4 tablets (40 mg total) by mouth daily. 02/21/19   Quintella Reichert, MD    Family History Family History  Problem Relation Age of Onset  . Hypertension Mother   . Cancer Father        colon  . Cancer Maternal Grandmother   . Cancer Maternal Grandfather   . Cancer Paternal Grandmother   . Cancer  Paternal Grandfather     Social History Social History   Tobacco Use  . Smoking status: Former Smoker    Packs/day: 0.25    Years: 1.00    Pack years: 0.25    Types: Cigarettes    Quit date: 06/17/2012    Years since quitting: 6.6  . Smokeless tobacco: Never Used  Substance Use Topics  . Alcohol use: Not Currently  . Drug use: Not Currently    Types: Marijuana     Allergies   Patient has no known allergies.   Review of Systems Review of Systems  Respiratory: Positive for shortness of breath.   All other systems reviewed and are negative.    Physical Exam Updated Vital Signs BP 124/78   Pulse 76   Temp 98.3 F (36.8 C) (Oral)   Resp 20   LMP 02/17/2019 (Exact Date)   SpO2 100%   Physical Exam Vitals signs and nursing note reviewed.  Constitutional:      Appearance: She is well-developed.  HENT:     Head: Normocephalic and atraumatic.  Cardiovascular:     Rate and Rhythm: Normal rate and regular rhythm.     Heart sounds: No murmur.  Pulmonary:     Effort: Pulmonary effort is normal. No respiratory distress.     Breath sounds: Normal breath sounds.  Abdominal:     Palpations: Abdomen is soft.     Tenderness: There is no abdominal tenderness. There is no guarding or rebound.  Musculoskeletal:        General: No swelling or tenderness.  Skin:    General: Skin is warm and dry.  Neurological:     Mental Status: She is alert and oriented to person, place, and time.  Psychiatric:        Behavior: Behavior normal.      ED Treatments / Results  Labs (all labs ordered are listed, but only abnormal results are displayed) Labs Reviewed - No data to display  EKG EKG Interpretation  Date/Time:  Tuesday February 21 2019 20:21:59 EST Ventricular Rate:  73 PR Interval:    QRS Duration: 103 QT Interval:  387 QTC Calculation: 427 R Axis:   14 Text Interpretation: Sinus rhythm Low voltage, precordial leads Confirmed by Quintella Reichert [phone]) on  02/21/2019 9:06:29 PM   Radiology Dg Chest Port 1 View  Result Date: 02/21/2019 CLINICAL DATA:  Shortness of breath, COVID-19 positive, history of asthma EXAM: PORTABLE CHEST 1 VIEW COMPARISON:  Radiograph 04/20/2018 FINDINGS: Mild airways thickening with developing patchy opacity in the right infrahilar lung. No pneumothorax or effusion. Cardiomediastinal contours are unremarkable. No acute osseous or soft tissue abnormality. IMPRESSION: Mild  airways thickening with developing patchy opacity in the right infrahilar lung, suspicious for pneumonia. Electronically Signed   By: Kreg Shropshire M.D.   On: 02/21/2019 20:45    Procedures Procedures (including critical care time)  Medications Ordered in ED Medications  albuterol (VENTOLIN HFA) 108 (90 Base) MCG/ACT inhaler 2 puff (2 puffs Inhalation Given 02/21/19 2020)  ondansetron (ZOFRAN-ODT) disintegrating tablet 4 mg (4 mg Oral Given 02/21/19 2020)  predniSONE (DELTASONE) tablet 40 mg (40 mg Oral Given 02/21/19 2212)  azithromycin (ZITHROMAX) tablet 500 mg (500 mg Oral Given 02/21/19 2212)     Initial Impression / Assessment and Plan / ED Course  I have reviewed the triage vital signs and the nursing notes.  Pertinent labs & imaging results that were available during my care of the patient were reviewed by me and considered in my medical decision making (see chart for details).        Patient with recent COVID-19 diagnoses here for evaluation of persistent shortness of breath, cough and nausea. She is non-toxic appearing on evaluation with no respiratory distress. Chest x-ray with possible right lower lobe infiltrate. Will treat for potential community acquired pneumonia given the focal infiltrate finding. Given her history of steroids will treat with prednisone burst. Counseled patient on home care for COVID as well is pneumonia. Discussed outpatient follow-up and return precautions. Current presentation is not consistent with sepsis, PE,  ACS.  Claudia Gonzales was evaluated in Emergency Department on 02/21/2019 for the symptoms described in the history of present illness. She was evaluated in the context of the global COVID-19 pandemic, which necessitated consideration that the patient might be at risk for infection with the SARS-CoV-2 virus that causes COVID-19. Institutional protocols and algorithms that pertain to the evaluation of patients at risk for COVID-19 are in a state of rapid change based on information released by regulatory bodies including the CDC and federal and state organizations. These policies and algorithms were followed during the patient's care in the ED.   Final Clinical Impressions(s) / ED Diagnoses   Final diagnoses:  COVID-19  Community acquired pneumonia of right lower lobe of lung    ED Discharge Orders         Ordered    predniSONE (DELTASONE) 10 MG tablet  Daily     02/21/19 2152    benzonatate (TESSALON) 100 MG capsule  3 times daily PRN     02/21/19 2152    azithromycin (ZITHROMAX) 250 MG tablet  Daily     02/21/19 2152           Tilden Fossa, MD 02/21/19 2310

## 2019-03-06 ENCOUNTER — Other Ambulatory Visit: Payer: Self-pay

## 2019-03-06 DIAGNOSIS — Z87891 Personal history of nicotine dependence: Secondary | ICD-10-CM | POA: Insufficient documentation

## 2019-03-06 DIAGNOSIS — Z0279 Encounter for issue of other medical certificate: Secondary | ICD-10-CM | POA: Diagnosis present

## 2019-03-06 DIAGNOSIS — J4520 Mild intermittent asthma, uncomplicated: Secondary | ICD-10-CM | POA: Diagnosis not present

## 2019-03-06 NOTE — ED Triage Notes (Addendum)
Patient reports she is here to get retested for COVID so she can return to work. Also reports chest congestion has not resolved from COVID pneumonia

## 2019-03-07 ENCOUNTER — Emergency Department (HOSPITAL_COMMUNITY)
Admission: EM | Admit: 2019-03-07 | Discharge: 2019-03-07 | Disposition: A | Discharge status: Home / Self Care | Payer: Medicaid Other | Attending: Emergency Medicine | Admitting: Emergency Medicine

## 2019-03-07 ENCOUNTER — Other Ambulatory Visit: Payer: Self-pay

## 2019-03-07 ENCOUNTER — Emergency Department (HOSPITAL_COMMUNITY): Payer: Medicaid Other

## 2019-03-07 DIAGNOSIS — J4520 Mild intermittent asthma, uncomplicated: Secondary | ICD-10-CM

## 2019-03-07 MED ORDER — FLUCONAZOLE 150 MG PO TABS
150.0000 mg | ORAL_TABLET | Freq: Once | ORAL | Status: AC
  Administered 2019-03-07: 150 mg via ORAL
  Filled 2019-03-07: qty 1

## 2019-03-07 NOTE — ED Notes (Signed)
X-ray at bedside

## 2019-03-07 NOTE — ED Provider Notes (Signed)
[facility]-EMERGENCY DEPT Provider Note   CSN: [id number] Arrival date & time: 03/06/19  2316     History   Chief Complaint No chief complaint on file.   HPI Claudia Landsberghyllis E Lowrimore is a 28 y.o. female.     Patient presents to the emergency department asking for clearance to return to work.  She was diagnosed with COVID-19 2 weeks ago.  Patient also has asthma.  She has had some congestion still, but is not experiencing any shortness of breath and symptoms overall are much improved.     Past Medical History:  Diagnosis Date  . Anxiety   . Asthma   . Chlamydia   . Depression   . Polyarthralgia 04/20/2018  . Preterm labor 2011    Patient Active Problem List   Diagnosis Date Noted  . Bacterial vaginosis 04/22/2018  . DGI (disseminated gonococcal infection) (HCC) 04/21/2018  . Polyarthralgia 04/20/2018  . Encounter for insertion of mirena IUD 10/03/2012  . Traumatic injury during pregnancy 07/21/2012  . Supervision of high risk pregnancy in third trimester 06/13/2012  . H/O premature delivery 05/02/2012  . Anxiety and depression 05/02/2012    Past Surgical History:  Procedure Laterality Date  . APPENDECTOMY    . WISDOM TOOTH EXTRACTION       OB History    Gravida  3   Para  2   Term  1   Preterm  1   AB      Living  2     SAB      TAB      Ectopic      Multiple      Live Births  2            Home Medications    Prior to Admission medications   Medication Sig Start Date End Date Taking? Authorizing Provider  albuterol (VENTOLIN HFA) 108 (90 Base) MCG/ACT inhaler Inhale 1-2 puffs into the lungs every 6 (six) hours as needed for wheezing or shortness of breath. 02/21/19   Lamptey, Britta MccreedyPhilip O, MD  azithromycin (ZITHROMAX) 250 MG tablet Take 1 tablet (250 mg total) by mouth daily. Take 1 tablet daily 02/21/19   Tilden Fossaees, Elizabeth, MD  benzonatate (TESSALON) 100 MG capsule Take 1 capsule (100 mg total) by mouth 3 (three) times  daily as needed for cough. 02/21/19   Tilden Fossaees, Elizabeth, MD  cefTRIAXone (ROCEPHIN) 500 MG injection Inject 250 mg into the muscle daily. For IM use in large muscle mass 04/25/18   Dorrell, Cathleen Cortieborah N, MD  cyclobenzaprine (FLEXERIL) 10 MG tablet Take 1 tablet (10 mg total) by mouth 3 (three) times daily as needed for muscle spasms. 11/24/18   Merrilee JanskyLamptey, Philip O, MD  fluticasone (FLONASE) 50 MCG/ACT nasal spray Place 1 spray into both nostrils daily. Patient taking differently: Place 1 spray into both nostrils daily as needed for allergies or rhinitis.  01/23/18   Maxwell CaulLayden, Lindsey A, PA-C  ibuprofen (ADVIL) 400 MG tablet Take 1 tablet (400 mg total) by mouth every 6 (six) hours as needed. 11/24/18   Merrilee JanskyLamptey, Philip O, MD  naproxen (NAPROSYN) 500 MG tablet Take 1 tablet (500 mg total) by mouth 2 (two) times daily. 05/11/18   Linwood DibblesKnapp, Jon, MD  ondansetron (ZOFRAN-ODT) 4 MG disintegrating tablet Take 1 tablet (4 mg total) by mouth every 8 (eight) hours as needed for nausea or vomiting. 02/19/19   Georgetta HaberBurky, Natalie B, NP  predniSONE (DELTASONE) 10 MG tablet Take 4 tablets (40 mg total) by  mouth daily. 02/21/19   Quintella Reichert, MD    Family History Family History  Problem Relation Age of Onset  . Hypertension Mother   . Cancer Father        colon  . Cancer Maternal Grandmother   . Cancer Maternal Grandfather   . Cancer Paternal Grandmother   . Cancer Paternal Grandfather     Social History Social History   Tobacco Use  . Smoking status: Former Smoker    Packs/day: 0.25    Years: 1.00    Pack years: 0.25    Types: Cigarettes    Quit date: 06/17/2012    Years since quitting: 6.7  . Smokeless tobacco: Never Used  Substance Use Topics  . Alcohol use: Not Currently  . Drug use: Not Currently    Types: Marijuana     Allergies   Patient has no known allergies.   Review of Systems Review of Systems  Constitutional: Negative for fever.  Respiratory: Positive for wheezing.   All other systems  reviewed and are negative.    Physical Exam Updated Vital Signs BP (!) 148/115 (BP Location: Left Arm)   Pulse 80   Temp 98.2 F (36.8 C) (Oral)   Resp 18   Ht 5\' 6"  (1.676 m)   Wt 115.7 kg   LMP 02/17/2019 (Exact Date)   SpO2 99%   BMI 41.16 kg/m   Physical Exam Vitals signs and nursing note reviewed.  Constitutional:      General: She is not in acute distress.    Appearance: Normal appearance. She is well-developed.  HENT:     Head: Normocephalic and atraumatic.     Right Ear: Hearing normal.     Left Ear: Hearing normal.     Nose: Nose normal.  Eyes:     Conjunctiva/sclera: Conjunctivae normal.     Pupils: Pupils are equal, round, and reactive to light.  Neck:     Musculoskeletal: Normal range of motion and neck supple.  Cardiovascular:     Rate and Rhythm: Regular rhythm.     Heart sounds: S1 normal and S2 normal. No murmur. No friction rub. No gallop.   Pulmonary:     Effort: Pulmonary effort is normal. No respiratory distress.     Breath sounds: Normal breath sounds.  Chest:     Chest wall: No tenderness.  Abdominal:     General: Bowel sounds are normal.     Palpations: Abdomen is soft.     Tenderness: There is no abdominal tenderness. There is no guarding or rebound. Negative signs include Murphy's sign and McBurney's sign.     Hernia: No hernia is present.  Musculoskeletal: Normal range of motion.  Skin:    General: Skin is warm and dry.     Findings: No rash.  Neurological:     Mental Status: She is alert and oriented to person, place, and time.     GCS: GCS eye subscore is 4. GCS verbal subscore is 5. GCS motor subscore is 6.     Cranial Nerves: No cranial nerve deficit.     Sensory: No sensory deficit.     Coordination: Coordination normal.  Psychiatric:        Speech: Speech normal.        Behavior: Behavior normal.        Thought Content: Thought content normal.      ED Treatments / Results  Labs (all labs ordered are listed, but only  abnormal results are displayed) Labs  Reviewed - No data to display  EKG None  Radiology Dg Chest Port 1 View  Result Date: 03/07/2019 CLINICAL DATA:  COVID.  Congestion EXAM: PORTABLE CHEST 1 VIEW COMPARISON:  02/21/2019 FINDINGS: Heart and mediastinal contours are within normal limits. No focal opacities or effusions. No acute bony abnormality. IMPRESSION: No active disease. Electronically Signed   By: Charlett Nose M.D.   On: 03/07/2019 03:12    Procedures Procedures (including critical care time)  Medications Ordered in ED Medications  fluconazole (DIFLUCAN) tablet 150 mg (has no administration in time range)     Initial Impression / Assessment and Plan / ED Course  I have reviewed the triage vital signs and the nursing notes.  Pertinent labs & imaging results that were available during my care of the patient were reviewed by me and considered in my medical decision making (see chart for details).        Patient diagnosed with Covid pneumonitis just over 2 weeks ago.  She also has asthma.  She has stayed out of work for the recommended 2 weeks and is now here asking for a return to work note.  Her chest x-ray is now clear, no persistent findings on the x-ray.  Examination is also normal, she is comfortable and healthy-appearing, lungs are clear, normal oxygenation.  Return to work.  Final Clinical Impressions(s) / ED Diagnoses   Final diagnoses:  Mild intermittent asthma, unspecified whether complicated    ED Discharge Orders    None       Gilda Crease, MD 03/07/19 [phone]

## 2019-03-11 ENCOUNTER — Other Ambulatory Visit: Payer: Self-pay

## 2019-03-11 ENCOUNTER — Encounter (HOSPITAL_COMMUNITY): Payer: Self-pay | Admitting: Emergency Medicine

## 2019-03-11 DIAGNOSIS — R300 Dysuria: Secondary | ICD-10-CM | POA: Diagnosis not present

## 2019-03-11 DIAGNOSIS — R1031 Right lower quadrant pain: Secondary | ICD-10-CM | POA: Diagnosis present

## 2019-03-11 DIAGNOSIS — Z5321 Procedure and treatment not carried out due to patient leaving prior to being seen by health care provider: Secondary | ICD-10-CM | POA: Insufficient documentation

## 2019-03-11 LAB — URINALYSIS, ROUTINE W REFLEX MICROSCOPIC
Bacteria, UA: NONE SEEN
Bilirubin Urine: NEGATIVE
Glucose, UA: NEGATIVE mg/dL
Ketones, ur: NEGATIVE mg/dL
Nitrite: NEGATIVE
Protein, ur: NEGATIVE mg/dL
Specific Gravity, Urine: 1.02 (ref 1.005–1.030)
pH: 6 (ref 5.0–8.0)

## 2019-03-11 LAB — PREGNANCY, URINE: Preg Test, Ur: NEGATIVE

## 2019-03-11 NOTE — ED Provider Notes (Signed)
MSE was initiated and I personally evaluated the patient and placed orders (if any) at  7:28 PM on March 18, 7654.  28 year old female who presents for evaluation of 2 days of dysuria, increased urinary frequency.  She is unsure if she has had some hematuria but noticed she has had some light spotting.  Her LMP she thinks was around 11/12-11/13.  Also reports some right-sided flank pain.  She has not had any nausea/vomiting.  She denies any fever.  The patient appears stable so that the remainder of the MSE may be completed by another provider.   Claudia Napoleon, PA-C 03/11/19 Orland Jarred, MD 03/11/19 2154

## 2019-03-11 NOTE — ED Triage Notes (Signed)
Patient here from home with complaints of right sided flank pain. Reports "irritation" when urinating x2 days. Denies n/v.

## 2019-03-12 ENCOUNTER — Ambulatory Visit (HOSPITAL_COMMUNITY)
Admission: EM | Admit: 2019-03-12 | Discharge: 2019-03-12 | Disposition: A | Discharge status: Home / Self Care | Payer: Medicaid Other | Attending: Family Medicine | Admitting: Family Medicine

## 2019-03-12 ENCOUNTER — Encounter (HOSPITAL_COMMUNITY): Payer: Self-pay

## 2019-03-12 ENCOUNTER — Emergency Department (HOSPITAL_COMMUNITY)
Admission: EM | Admit: 2019-03-12 | Discharge: 2019-03-12 | Disposition: A | Discharge status: Home / Self Care | Payer: Medicaid Other | Attending: Emergency Medicine | Admitting: Emergency Medicine

## 2019-03-12 ENCOUNTER — Other Ambulatory Visit: Payer: Self-pay

## 2019-03-12 DIAGNOSIS — R32 Unspecified urinary incontinence: Secondary | ICD-10-CM

## 2019-03-12 DIAGNOSIS — N10 Acute tubulo-interstitial nephritis: Secondary | ICD-10-CM | POA: Insufficient documentation

## 2019-03-12 DIAGNOSIS — R3915 Urgency of urination: Secondary | ICD-10-CM | POA: Diagnosis present

## 2019-03-12 DIAGNOSIS — R350 Frequency of micturition: Secondary | ICD-10-CM | POA: Diagnosis present

## 2019-03-12 LAB — BASIC METABOLIC PANEL
Anion gap: 6 (ref 5–15)
BUN: 12 mg/dL (ref 6–20)
CO2: 25 mmol/L (ref 22–32)
Calcium: 9.5 mg/dL (ref 8.9–10.3)
Chloride: 105 mmol/L (ref 98–111)
Creatinine, Ser: 0.65 mg/dL (ref 0.44–1.00)
GFR calc Af Amer: >60 mL/min (ref >60)
GFR calc non Af Amer: >60 mL/min (ref >60)
Glucose, Bld: 94 mg/dL (ref 70–99)
Potassium: 4 mmol/L (ref 3.5–5.1)
Sodium: 136 mmol/L (ref 135–145)

## 2019-03-12 LAB — CBC
HCT: 37.1 % (ref 36.0–46.0)
Hemoglobin: 12.4 g/dL (ref 12.0–15.0)
MCH: 29.2 pg (ref 26.0–34.0)
MCHC: 33.4 g/dL (ref 30.0–36.0)
MCV: 87.5 fL (ref 80.0–100.0)
Platelets: 276 10*3/uL (ref 150–400)
RBC: 4.24 MIL/uL (ref 3.87–5.11)
RDW: 12.9 % (ref 11.5–15.5)
WBC: 6.7 10*3/uL (ref 4.0–10.5)
nRBC: 0 % (ref 0.0–0.2)

## 2019-03-12 LAB — POCT URINALYSIS DIP (DEVICE)
Bilirubin Urine: NEGATIVE
Glucose, UA: NEGATIVE mg/dL
Ketones, ur: NEGATIVE mg/dL
Nitrite: NEGATIVE
Protein, ur: NEGATIVE mg/dL
Specific Gravity, Urine: >=1.03 (ref 1.005–1.030)
Urobilinogen, UA: 0.2 mg/dL (ref 0.0–1.0)
pH: 5.5 (ref 5.0–8.0)

## 2019-03-12 MED ORDER — CEFTRIAXONE SODIUM 1 G IJ SOLR
INTRAMUSCULAR | Status: AC
  Filled 2019-03-12: qty 10

## 2019-03-12 MED ORDER — CIPROFLOXACIN HCL 500 MG PO TABS: 500.0000 mg | ORAL_TABLET | Freq: Two times a day (BID) | ORAL | 0 refills | Status: DC

## 2019-03-12 MED ORDER — CEFTRIAXONE SODIUM 1 G IJ SOLR
1.0000 g | Freq: Once | INTRAMUSCULAR | Status: AC
  Administered 2019-03-12: 1 g via INTRAMUSCULAR

## 2019-03-12 MED ORDER — LIDOCAINE HCL (PF) 1 % IJ SOLN
INTRAMUSCULAR | Status: AC
  Filled 2019-03-12: qty 2

## 2019-03-12 NOTE — Discharge Instructions (Addendum)
Hydrate well with at least 2 liters (64 ounces) of water daily. You may take 500mg Tylenol every 6 hours for pain and inflammation. ° ° °

## 2019-03-12 NOTE — ED Provider Notes (Signed)
[facility]   MRN: [id number] DOB: 03-13-1991  Subjective:   Claudia Gonzales is a 28 y.o. female presenting for 3-day history of cute onset worsening dysuria, urinary frequency, urinary urgency now having urinary incontinence, right-sided flank pain.  Has had some nausea without vomiting.  Patient was in the ER yesterday, had signs of cystitis, negative pregnancy test.  She did leave without being seen but wanted to come back due to the results she is on my chart.  Of note patient tests positive for COVID-19 on 02/19/2019.  She underwent a lot of medications but has since cleared the infection.  She would like a work note as she has to perform strenuous work activities at work and work restrictions are really approved.  No current facility-administered medications for this encounter.   Current Outpatient Medications:  .  albuterol (VENTOLIN HFA) 108 (90 Base) MCG/ACT inhaler, Inhale 1-2 puffs into the lungs every 6 (six) hours as needed for wheezing or shortness of breath., Disp: 18 g, Rfl: 1 .  azithromycin (ZITHROMAX) 250 MG tablet, Take 1 tablet (250 mg total) by mouth daily. Take 1 tablet daily, Disp: 4 tablet, Rfl: 0 .  benzonatate (TESSALON) 100 MG capsule, Take 1 capsule (100 mg total) by mouth 3 (three) times daily as needed for cough., Disp: 15 capsule, Rfl: 0 .  cefTRIAXone (ROCEPHIN) 500 MG injection, Inject 250 mg into the muscle daily. For IM use in large muscle mass, Disp: 1 each, Rfl: 0 .  cyclobenzaprine (FLEXERIL) 10 MG tablet, Take 1 tablet (10 mg total) by mouth 3 (three) times daily as needed for muscle spasms., Disp: 20 tablet, Rfl: 0 .  fluticasone (FLONASE) 50 MCG/ACT nasal spray, Place 1 spray into both nostrils daily. (Patient taking differently: Place 1 spray into both nostrils daily as needed for allergies or rhinitis. ), Disp: 16 g, Rfl: 2 .  ibuprofen (ADVIL) 400 MG tablet, Take 1 tablet (400 mg total) by mouth every 6 (six) hours as needed., Disp: 30  tablet, Rfl: 0 .  naproxen (NAPROSYN) 500 MG tablet, Take 1 tablet (500 mg total) by mouth 2 (two) times daily., Disp: 30 tablet, Rfl: 0 .  ondansetron (ZOFRAN-ODT) 4 MG disintegrating tablet, Take 1 tablet (4 mg total) by mouth every 8 (eight) hours as needed for nausea or vomiting., Disp: 12 tablet, Rfl: 0 .  predniSONE (DELTASONE) 10 MG tablet, Take 4 tablets (40 mg total) by mouth daily., Disp: 16 tablet, Rfl: 0   No Known Allergies  Past Medical History:  Diagnosis Date  . Anxiety   . Asthma   . Chlamydia   . Depression   . Polyarthralgia 04/20/2018  . Preterm labor 2011     Past Surgical History:  Procedure Laterality Date  . APPENDECTOMY    . WISDOM TOOTH EXTRACTION      Family History  Problem Relation Age of Onset  . Hypertension Mother   . Cancer Father        colon  . Cancer Maternal Grandmother   . Cancer Maternal Grandfather   . Cancer Paternal Grandmother   . Cancer Paternal Grandfather     Social History   Tobacco Use  . Smoking status: Former Smoker    Packs/day: 0.25    Years: 1.00    Pack years: 0.25    Types: Cigarettes    Quit date: 06/17/2012    Years since quitting: 6.7  . Smokeless tobacco: Never Used  Substance Use Topics  . Alcohol use: Yes  Comment: rarely  . Drug use: Not Currently    Types: Marijuana    Review of Systems  Constitutional: Negative for fever and malaise/fatigue.  HENT: Negative for congestion, ear pain, sinus pain and sore throat.   Eyes: Negative for discharge and redness.  Respiratory: Negative for cough, hemoptysis, shortness of breath and wheezing.   Cardiovascular: Negative for chest pain.  Gastrointestinal: Positive for nausea. Negative for abdominal pain, constipation, diarrhea and vomiting.  Genitourinary: Positive for dysuria, flank pain, frequency and urgency. Negative for hematuria.  Musculoskeletal: Negative for myalgias.  Skin: Negative for rash.  Neurological: Negative for dizziness, weakness and  headaches.  Psychiatric/Behavioral: Negative for depression and substance abuse.     Objective:   Vitals: BP 116/68 (BP Location: Right Arm)   Pulse 86   Temp 98.4 F (36.9 C) (Oral)   Resp 15   LMP 02/17/2019 (Exact Date)   SpO2 100%   Physical Exam Constitutional:      General: She is not in acute distress.    Appearance: Normal appearance. She is well-developed and normal weight. She is not ill-appearing, toxic-appearing or diaphoretic.  HENT:     Head: Normocephalic and atraumatic.     Right Ear: External ear normal.     Left Ear: External ear normal.     Nose: Nose normal.     Mouth/Throat:     Mouth: Mucous membranes are moist.     Pharynx: Oropharynx is clear.  Eyes:     General: No scleral icterus.    Extraocular Movements: Extraocular movements intact.     Pupils: Pupils are equal, round, and reactive to light.  Cardiovascular:     Rate and Rhythm: Normal rate and regular rhythm.     Heart sounds: Normal heart sounds. No murmur. No friction rub. No gallop.   Pulmonary:     Effort: Pulmonary effort is normal. No respiratory distress.     Breath sounds: Normal breath sounds. No stridor. No wheezing, rhonchi or rales.  Abdominal:     General: Bowel sounds are normal. There is no distension.     Palpations: Abdomen is soft. There is no mass.     Tenderness: There is no abdominal tenderness. There is right CVA tenderness. There is no left CVA tenderness, guarding or rebound.  Skin:    General: Skin is warm and dry.     Coloration: Skin is not pale.     Findings: No rash.  Neurological:     General: No focal deficit present.     Mental Status: She is alert and oriented to person, place, and time.  Psychiatric:        Mood and Affect: Mood normal.        Behavior: Behavior normal.        Thought Content: Thought content normal.        Judgment: Judgment normal.     Results for orders placed or performed during the hospital encounter of 03/12/19 (from the  past 24 hour(s))  POCT urinalysis dip (device)     Status: Abnormal   Collection Time: 03/12/19  1:53 PM  Result Value Ref Range   Glucose, UA NEGATIVE NEGATIVE mg/dL   Bilirubin Urine NEGATIVE NEGATIVE   Ketones, ur NEGATIVE NEGATIVE mg/dL   Specific Gravity, Urine >=1.030 1.005 - 1.030   Hgb urine dipstick SMALL (A) NEGATIVE   pH 5.5 5.0 - 8.0   Protein, ur NEGATIVE NEGATIVE mg/dL   Urobilinogen, UA 0.2 0.0 - 1.0 mg/dL  Nitrite NEGATIVE NEGATIVE   Leukocytes,Ua TRACE (A) NEGATIVE    Assessment and Plan :   1. Acute pyelonephritis   2. Urinary frequency   3. Urinary urgency   4. Urinary incontinence, unspecified type     Will cover for pyelonephritis with IM ceftriaxone, ciprofloxacin as an outpatient.  Urine cultures pending.  Recommended daily adequate hydration with plain water.  Labs pending in light of recent recovery from COVID-19. Counseled patient on potential for adverse effects with medications prescribed/recommended today, ER and return-to-clinic precautions discussed, patient verbalized understanding.    Jaynee Eagles, PA-C 03/12/19 1426

## 2019-03-12 NOTE — ED Triage Notes (Signed)
Patient presents to Urgent Care with complaints of possible uti since 3 days of urinary incontinence and urgency. Patient reports she went to [facility] last night and [facility] but her my chart indicated she may have an infection.  Pt endorses lower back pain and burning w/ urination.

## 2019-03-13 LAB — URINE CULTURE: Culture: <10000 — AB

## 2019-08-02 ENCOUNTER — Other Ambulatory Visit: Payer: Self-pay

## 2019-08-02 ENCOUNTER — Ambulatory Visit (HOSPITAL_COMMUNITY)
Admission: EM | Admit: 2019-08-02 | Discharge: 2019-08-02 | Disposition: A | Discharge status: Home / Self Care | Payer: Medicaid Other | Attending: Family Medicine | Admitting: Family Medicine

## 2019-08-02 ENCOUNTER — Encounter (HOSPITAL_COMMUNITY): Payer: Self-pay

## 2019-08-02 DIAGNOSIS — N760 Acute vaginitis: Secondary | ICD-10-CM | POA: Insufficient documentation

## 2019-08-02 MED ORDER — NYSTATIN-TRIAMCINOLONE 100000-0.1 UNIT/GM-% EX CREA: TOPICAL_CREAM | CUTANEOUS | 0 refills | Status: DC

## 2019-08-02 MED ORDER — FLUCONAZOLE 150 MG PO TABS: 150.0000 mg | ORAL_TABLET | Freq: Every day | ORAL | 0 refills | Status: DC

## 2019-08-02 NOTE — Discharge Instructions (Addendum)
Treating you for a vaginal yeast infection. Use the cream and medication as prescribed. Recommend baking soda bath to help soothe the area.  You do 1/3 cup of baking soda sprinkled in warm water and sit Swab sent for testing with labs pending We will call with any positive results.

## 2019-08-02 NOTE — ED Triage Notes (Signed)
Pt states she has vaginal irration x 3 days. Pt states she has some vaginal discharge.

## 2019-08-02 NOTE — ED Provider Notes (Signed)
[facility] [facility]    CSN: [id number] Arrival date & time: 08/02/19  3419      History   Chief Complaint Chief Complaint  Patient presents with  . Vaginal Discharge    HPI Claudia Gonzales is a 29 y.o. female.   Patient is a 29 year old female who presents today for vaginal irritation, itching, pain and swelling x3 days.  Symptoms have been constant and worsening.  She tried using Monistat cream without any relief.  Reported last sexual intercourse was approximate 3 weeks ago unprotected.  Reporting no new partners.  Does have a history of gonorrhea, chlamydia.  No associate abdominal pain, back pain, fever, dysuria, hematuria or urinary frequency. Patient's last menstrual period was 07/07/2019.  ROS per HPI      Past Medical History:  Diagnosis Date  . Anxiety   . Asthma   . Chlamydia   . Depression   . Polyarthralgia 04/20/2018  . Preterm labor 2011    Patient Active Problem List   Diagnosis Date Noted  . Bacterial vaginosis 04/22/2018  . DGI (disseminated gonococcal infection) ([place]) 04/21/2018  . Polyarthralgia 04/20/2018  . Encounter for insertion of mirena IUD 10/03/2012  . Traumatic injury during pregnancy 07/21/2012  . Supervision of high risk pregnancy in third trimester 06/13/2012  . H/O premature delivery 05/02/2012  . Anxiety and depression 05/02/2012    Past Surgical History:  Procedure Laterality Date  . APPENDECTOMY    . WISDOM TOOTH EXTRACTION      OB History    Gravida  3   Para  2   Term  1   Preterm  1   AB      Living  2     SAB      TAB      Ectopic      Multiple      Live Births  2            Home Medications    Prior to Admission medications   Medication Sig Start Date End Date Taking? Authorizing Provider  fluconazole (DIFLUCAN) 150 MG tablet Take 1 tablet (150 mg total) by mouth daily. 08/02/19   Orvan July, NP  nystatin-triamcinolone (MYCOLOG II) cream Apply to affected area daily 08/02/19   Loura Halt A, NP  albuterol (VENTOLIN HFA) 108 (90 Base) MCG/ACT inhaler Inhale 1-2 puffs into the lungs every 6 (six) hours as needed for wheezing or shortness of breath. 02/21/19 03/12/19  Chase Picket, MD  fluticasone (FLONASE) 50 MCG/ACT nasal spray Place 1 spray into both nostrils daily. Patient taking differently: Place 1 spray into both nostrils daily as needed for allergies or rhinitis.  01/23/18 03/12/19  Volanda Napoleon, PA-C    Family History Family History  Problem Relation Age of Onset  . Hypertension Mother   . Cancer Father        colon  . Cancer Maternal Grandmother   . Cancer Maternal Grandfather   . Cancer Paternal Grandmother   . Cancer Paternal Grandfather     Social History Social History   Tobacco Use  . Smoking status: Former Smoker    Packs/day: 0.25    Years: 1.00    Pack years: 0.25    Types: Cigarettes    Quit date: 06/17/2012    Years since quitting: 7.1  . Smokeless tobacco: Never Used  Substance Use Topics  . Alcohol use: Yes    Comment: rarely  . Drug use: Not Currently  Types: Marijuana     Allergies   Patient has no known allergies.   Review of Systems Review of Systems   Physical Exam Triage Vital Signs ED Triage Vitals  Enc Vitals Group     BP 08/02/19 0837 124/84     Pulse Rate 08/02/19 0837 83     Resp 08/02/19 0837 18     Temp 08/02/19 0837 98.5 F (36.9 C)     Temp Source 08/02/19 0837 Oral     SpO2 08/02/19 0837 98 %     Weight 08/02/19 0835 263 lb (119.3 kg)     Height --      Head Circumference --      Peak Flow --      Pain Score 08/02/19 0835 10     Pain Loc --      Pain Edu? --      Excl. in GC? --    No data found.  Updated Vital Signs BP 124/84 (BP Location: Right Arm)   Pulse 83   Temp 98.5 F (36.9 C) (Oral)   Resp 18   Wt 263 lb (119.3 kg)   LMP 07/07/2019   SpO2 98%   BMI 42.45 kg/m   Visual Acuity Right Eye Distance:   Left Eye Distance:   Bilateral Distance:    Right Eye Near:     Left Eye Near:    Bilateral Near:     Physical Exam Vitals and nursing note reviewed.  Constitutional:      General: She is not in acute distress.    Appearance: Normal appearance. She is not ill-appearing, toxic-appearing or diaphoretic.  HENT:     Head: Normocephalic.     Nose: Nose normal.  Eyes:     Conjunctiva/sclera: Conjunctivae normal.  Pulmonary:     Effort: Pulmonary effort is normal.  Abdominal:     Palpations: Abdomen is soft.     Tenderness: There is no abdominal tenderness.  Genitourinary:    Vagina: Vaginal discharge present.     Comments: External vaginal exam with swollen, labia minora and majora. White discharge(most likely monistat cream) Erythematous near vaginal opening and tender to touch. No rashes Musculoskeletal:        General: Normal range of motion.     Cervical back: Normal range of motion.  Skin:    General: Skin is warm and dry.     Findings: No rash.  Neurological:     Mental Status: She is alert.  Psychiatric:        Mood and Affect: Mood normal.      UC Treatments / Results  Labs (all labs ordered are listed, but only abnormal results are displayed) Labs Reviewed  CERVICOVAGINAL ANCILLARY ONLY    EKG   Radiology No results found.  Procedures Procedures (including critical care time)  Medications Ordered in UC Medications - No data to display  Initial Impression / Assessment and Plan / UC Course  I have reviewed the triage vital signs and the nursing notes.  Pertinent labs & imaging results that were available during my care of the patient were reviewed by me and considered in my medical decision making (see chart for details).     Vaginitis Most likely yeast infection Treating with Mycolog cream and Diflucan Recommended baking soda bath for discomfort Sending swab for testing with  labs pending. Final Clinical Impressions(s) / UC Diagnoses   Final diagnoses:  Acute vaginitis     Discharge Instructions  Treating you for a vaginal yeast infection. Use the cream and medication as prescribed. Recommend baking soda bath to help soothe the area.  You do 1/3 cup of baking soda sprinkled in warm water and sit Swab sent for testing with labs pending We will call with any positive results.     ED Prescriptions    Medication Sig Dispense Auth. Provider   nystatin-triamcinolone (MYCOLOG II) cream Apply to affected area daily 15 g Nalany Steedley A, NP   fluconazole (DIFLUCAN) 150 MG tablet Take 1 tablet (150 mg total) by mouth daily. 2 tablet Dahlia Byes A, NP     PDMP not reviewed this encounter.   Janace Aris, NP 08/02/19 [phone]

## 2019-08-03 LAB — CERVICOVAGINAL ANCILLARY ONLY
Bacterial Vaginitis (gardnerella): NEGATIVE
Candida Glabrata: NEGATIVE
Candida Vaginitis: POSITIVE — AB
Chlamydia: NEGATIVE
Comment: NEGATIVE
Comment: NEGATIVE
Comment: NEGATIVE
Comment: NEGATIVE
Comment: NEGATIVE
Comment: NORMAL
Neisseria Gonorrhea: NEGATIVE
Trichomonas: NEGATIVE

## 2019-09-11 ENCOUNTER — Emergency Department (HOSPITAL_COMMUNITY)
Admission: EM | Admit: 2019-09-11 | Discharge: 2019-09-12 | Disposition: A | Discharge status: Home / Self Care | Payer: Medicaid Other | Attending: Emergency Medicine | Admitting: Emergency Medicine

## 2019-09-11 ENCOUNTER — Other Ambulatory Visit: Payer: Self-pay

## 2019-09-11 ENCOUNTER — Encounter (HOSPITAL_COMMUNITY): Payer: Self-pay

## 2019-09-11 DIAGNOSIS — Z5321 Procedure and treatment not carried out due to patient leaving prior to being seen by health care provider: Secondary | ICD-10-CM | POA: Insufficient documentation

## 2019-09-11 DIAGNOSIS — R109 Unspecified abdominal pain: Secondary | ICD-10-CM | POA: Diagnosis present

## 2019-09-11 NOTE — ED Triage Notes (Addendum)
Pt arrives POV for eval s/p medical abortion on 6/3 which was performed at a clinic. Pt reports since that time she has had no vaginal bleeding at all, no spotting. Pt reports yesterday she started with tenderness on the top of her stomach. Pt reports swelling/bloating and tender to touch. Pt reports subjective fevers at home

## 2019-09-12 LAB — CBC WITH DIFFERENTIAL/PLATELET
Abs Immature Granulocytes: 0.02 10*3/uL (ref 0.00–0.07)
Basophils Absolute: 0 10*3/uL (ref 0.0–0.1)
Basophils Relative: 0 %
Eosinophils Absolute: 0.2 10*3/uL (ref 0.0–0.5)
Eosinophils Relative: 2 %
HCT: 33.5 % — ABNORMAL LOW (ref 36.0–46.0)
Hemoglobin: 10.9 g/dL — ABNORMAL LOW (ref 12.0–15.0)
Immature Granulocytes: 0 %
Lymphocytes Relative: 38 %
Lymphs Abs: 3.4 10*3/uL (ref 0.7–4.0)
MCH: 29.9 pg (ref 26.0–34.0)
MCHC: 32.5 g/dL (ref 30.0–36.0)
MCV: 91.8 fL (ref 80.0–100.0)
Monocytes Absolute: 0.6 10*3/uL (ref 0.1–1.0)
Monocytes Relative: 7 %
Neutro Abs: 4.7 10*3/uL (ref 1.7–7.7)
Neutrophils Relative %: 53 %
Platelets: 232 10*3/uL (ref 150–400)
RBC: 3.65 MIL/uL — ABNORMAL LOW (ref 3.87–5.11)
RDW: 13.4 % (ref 11.5–15.5)
WBC: 9 10*3/uL (ref 4.0–10.5)
nRBC: 0 % (ref 0.0–0.2)

## 2019-09-12 LAB — COMPREHENSIVE METABOLIC PANEL
ALT: 27 U/L (ref 0–44)
AST: 22 U/L (ref 15–41)
Albumin: 3 g/dL — ABNORMAL LOW (ref 3.5–5.0)
Alkaline Phosphatase: 50 U/L (ref 38–126)
Anion gap: 7 (ref 5–15)
BUN: 10 mg/dL (ref 6–20)
CO2: 23 mmol/L (ref 22–32)
Calcium: 9.1 mg/dL (ref 8.9–10.3)
Chloride: 104 mmol/L (ref 98–111)
Creatinine, Ser: 0.71 mg/dL (ref 0.44–1.00)
GFR calc Af Amer: >60 mL/min (ref >60)
GFR calc non Af Amer: >60 mL/min (ref >60)
Glucose, Bld: 96 mg/dL (ref 70–99)
Potassium: 4.1 mmol/L (ref 3.5–5.1)
Sodium: 134 mmol/L — ABNORMAL LOW (ref 135–145)
Total Bilirubin: 0.1 mg/dL — ABNORMAL LOW (ref 0.3–1.2)
Total Protein: 6.7 g/dL (ref 6.5–8.1)

## 2019-09-12 LAB — URINALYSIS, ROUTINE W REFLEX MICROSCOPIC
Bilirubin Urine: NEGATIVE
Glucose, UA: NEGATIVE mg/dL
Ketones, ur: NEGATIVE mg/dL
Leukocytes,Ua: NEGATIVE
Nitrite: NEGATIVE
Protein, ur: NEGATIVE mg/dL
Specific Gravity, Urine: 1.024 (ref 1.005–1.030)
pH: 6 (ref 5.0–8.0)

## 2019-09-12 NOTE — ED Notes (Signed)
Pt advised against checking out. Pt checked out. 

## 2020-08-09 ENCOUNTER — Ambulatory Visit (HOSPITAL_COMMUNITY)
Admission: EM | Admit: 2020-08-09 | Discharge: 2020-08-09 | Disposition: A | Discharge status: Home / Self Care | Payer: Medicaid Other

## 2020-08-09 ENCOUNTER — Encounter (HOSPITAL_COMMUNITY): Payer: Self-pay

## 2020-08-09 ENCOUNTER — Emergency Department (HOSPITAL_COMMUNITY): Payer: Medicaid Other

## 2020-08-09 ENCOUNTER — Emergency Department (HOSPITAL_COMMUNITY)
Admission: EM | Admit: 2020-08-09 | Discharge: 2020-08-10 | Disposition: A | Discharge status: Home / Self Care | Payer: Medicaid Other | Attending: Emergency Medicine | Admitting: Emergency Medicine

## 2020-08-09 ENCOUNTER — Other Ambulatory Visit: Payer: Self-pay

## 2020-08-09 DIAGNOSIS — R064 Hyperventilation: Secondary | ICD-10-CM | POA: Diagnosis not present

## 2020-08-09 DIAGNOSIS — R0989 Other specified symptoms and signs involving the circulatory and respiratory systems: Secondary | ICD-10-CM | POA: Diagnosis not present

## 2020-08-09 DIAGNOSIS — R202 Paresthesia of skin: Secondary | ICD-10-CM | POA: Insufficient documentation

## 2020-08-09 DIAGNOSIS — J45909 Unspecified asthma, uncomplicated: Secondary | ICD-10-CM | POA: Diagnosis not present

## 2020-08-09 DIAGNOSIS — Z87891 Personal history of nicotine dependence: Secondary | ICD-10-CM | POA: Diagnosis not present

## 2020-08-09 DIAGNOSIS — R0602 Shortness of breath: Secondary | ICD-10-CM | POA: Diagnosis present

## 2020-08-09 DIAGNOSIS — D649 Anemia, unspecified: Secondary | ICD-10-CM | POA: Insufficient documentation

## 2020-08-09 DIAGNOSIS — F458 Other somatoform disorders: Secondary | ICD-10-CM

## 2020-08-09 LAB — COMPREHENSIVE METABOLIC PANEL
ALT: 28 U/L (ref 0–44)
AST: 21 U/L (ref 15–41)
Albumin: 3.1 g/dL — ABNORMAL LOW (ref 3.5–5.0)
Alkaline Phosphatase: 55 U/L (ref 38–126)
Anion gap: 5 (ref 5–15)
BUN: 7 mg/dL (ref 6–20)
CO2: 25 mmol/L (ref 22–32)
Calcium: 8.9 mg/dL (ref 8.9–10.3)
Chloride: 104 mmol/L (ref 98–111)
Creatinine, Ser: 0.64 mg/dL (ref 0.44–1.00)
GFR, Estimated: >60 mL/min (ref >60)
Glucose, Bld: 86 mg/dL (ref 70–99)
Potassium: 3.7 mmol/L (ref 3.5–5.1)
Sodium: 134 mmol/L — ABNORMAL LOW (ref 135–145)
Total Bilirubin: 0.2 mg/dL — ABNORMAL LOW (ref 0.3–1.2)
Total Protein: 6.4 g/dL — ABNORMAL LOW (ref 6.5–8.1)

## 2020-08-09 LAB — CBC WITH DIFFERENTIAL/PLATELET
Abs Immature Granulocytes: 0.02 10*3/uL (ref 0.00–0.07)
Basophils Absolute: 0.1 10*3/uL (ref 0.0–0.1)
Basophils Relative: 1 %
Eosinophils Absolute: 0.2 10*3/uL (ref 0.0–0.5)
Eosinophils Relative: 2 %
HCT: 34.3 % — ABNORMAL LOW (ref 36.0–46.0)
Hemoglobin: 11.2 g/dL — ABNORMAL LOW (ref 12.0–15.0)
Immature Granulocytes: 0 %
Lymphocytes Relative: 48 %
Lymphs Abs: 4.2 10*3/uL — ABNORMAL HIGH (ref 0.7–4.0)
MCH: 29.5 pg (ref 26.0–34.0)
MCHC: 32.7 g/dL (ref 30.0–36.0)
MCV: 90.3 fL (ref 80.0–100.0)
Monocytes Absolute: 0.7 10*3/uL (ref 0.1–1.0)
Monocytes Relative: 7 %
Neutro Abs: 3.7 10*3/uL (ref 1.7–7.7)
Neutrophils Relative %: 42 %
Platelets: 239 10*3/uL (ref 150–400)
RBC: 3.8 MIL/uL — ABNORMAL LOW (ref 3.87–5.11)
RDW: 13.4 % (ref 11.5–15.5)
WBC: 8.8 10*3/uL (ref 4.0–10.5)
nRBC: 0 % (ref 0.0–0.2)

## 2020-08-09 LAB — I-STAT BETA HCG BLOOD, ED (MC, WL, AP ONLY): I-stat hCG, quantitative: <5 m[IU]/mL (ref <5)

## 2020-08-09 LAB — BRAIN NATRIURETIC PEPTIDE: B Natriuretic Peptide: 24 pg/mL (ref 0.0–100.0)

## 2020-08-09 LAB — TSH: TSH: 2.119 u[IU]/mL (ref 0.350–4.500)

## 2020-08-09 LAB — I-STAT CREATININE, ED: Creatinine, Ser: 0.6 mg/dL (ref 0.44–1.00)

## 2020-08-09 NOTE — ED Notes (Signed)
Patient is being discharged from the Urgent Care and sent to the Emergency Department via POV . Per Dorann Ou, PA, patient is in need of higher level of care due to arm & leg numbness, sob, seeing spots. Patient is aware and verbalizes understanding of plan of care. There were no vitals filed for this visit.

## 2020-08-09 NOTE — ED Triage Notes (Signed)
Pt reports she came home today, laid down in her bed and felt like someone was choking her, pt feels like she was unable to breathe, she feels spaced out, unable to stay connected to what she is talking about. Pt felt SOB, used her inhaler an felt tingly all over. Pt also reports an overwhelming sense of depression but states she has no reason to be sad. Pt denies SI/HI

## 2020-08-09 NOTE — ED Provider Notes (Signed)
[facility] EMERGENCY DEPARTMENT Provider Note   CSN: [id number] Arrival date & time: 08/09/20  1819     History Chief Complaint  Patient presents with  . Shortness of Breath  . Depression    Claudia Gonzales is a 30 y.o. female resents the emergency department with a chief complaint of shortness of breath.  Patient states that she awoke from sleep feeling like someone was choking her.  She began noticing that her both of her hands were tingling and cramping.  She felt like she was going to pass out.  She had numbness and tingling over her face as well.  Patient states that this lasted for short period of time and then resolved.  She did feel panicked at the time.  Has never had anything like this before.  She denies other episodes of orthopnea or PND.  Patient does note that she has been extremely fatigued and felt like she has had more exertional dyspnea but that she has had rapid weight gain of unknown etiology.  She has been attributing her fatigue to this and states that she has been feeling depressed because of her weight gain.  Patient is seeing a Veterinary surgeon.  Denies peripheral edema.  She does have heavy periods.  HPI     Past Medical History:  Diagnosis Date  . Anxiety   . Asthma   . Chlamydia   . Depression   . Polyarthralgia 04/20/2018  . Preterm labor 2011    Patient Active Problem List   Diagnosis Date Noted  . Bacterial vaginosis 04/22/2018  . DGI (disseminated gonococcal infection) ([facility]) 04/21/2018  . Polyarthralgia 04/20/2018  . Encounter for insertion of mirena IUD 10/03/2012  . Traumatic injury during pregnancy 07/21/2012  . Supervision of high risk pregnancy in third trimester 06/13/2012  . H/O premature delivery 05/02/2012  . Anxiety and depression 05/02/2012    Past Surgical History:  Procedure Laterality Date  . APPENDECTOMY    . WISDOM TOOTH EXTRACTION       OB History    Gravida  3   Para  2   Term  1   Preterm  1   AB       Living  2     SAB      IAB      Ectopic      Multiple      Live Births  2           Family History  Problem Relation Age of Onset  . Hypertension Mother   . Cancer Father        colon  . Cancer Maternal Grandmother   . Cancer Maternal Grandfather   . Cancer Paternal Grandmother   . Cancer Paternal Grandfather     Social History   Tobacco Use  . Smoking status: Former Smoker    Packs/day: 0.25    Years: 1.00    Pack years: 0.25    Types: Cigarettes    Quit date: 06/17/2012    Years since quitting: 8.1  . Smokeless tobacco: Never Used  Vaping Use  . Vaping Use: Never used  Substance Use Topics  . Alcohol use: Yes    Comment: rarely  . Drug use: Not Currently    Types: Marijuana    Home Medications Prior to Admission medications   Medication Sig Start Date End Date Taking? Authorizing Provider  PROAIR HFA 108 (90 Base) MCG/ACT inhaler Inhale 1 puff into the lungs every 6 (six)  hours as needed for wheezing or shortness of breath. 03/28/20  Yes [provider]  fluticasone (FLONASE) 50 MCG/ACT nasal spray Place 1 spray into both nostrils daily. Patient taking differently: Place 1 spray into both nostrils daily as needed for allergies or rhinitis.  01/23/18 03/12/19  Maxwell Caul, PA-C    Allergies    Patient has no known allergies.  Review of Systems   Review of Systems Ten systems reviewed and are negative for acute change, except as noted in the HPI.   Physical Exam Updated Vital Signs BP 106/62   Pulse 78   Temp 99 F (37.2 C) (Oral)   Resp 18   SpO2 100%   Physical Exam Vitals and nursing note reviewed.  Constitutional:      General: She is not in acute distress.    Appearance: She is well-developed. She is not diaphoretic.  HENT:     Head: Normocephalic and atraumatic.  Eyes:     General: No scleral icterus.    Conjunctiva/sclera: Conjunctivae normal.  Cardiovascular:     Rate and Rhythm: Normal rate and regular  rhythm.     Heart sounds: Normal heart sounds. No murmur heard. No friction rub. No gallop.   Pulmonary:     Effort: Pulmonary effort is normal. No respiratory distress.     Breath sounds: Normal breath sounds.  Abdominal:     General: Bowel sounds are normal. There is no distension.     Palpations: Abdomen is soft. There is no mass.     Tenderness: There is no abdominal tenderness. There is no guarding.  Musculoskeletal:     Cervical back: Normal range of motion.     Right lower leg: No edema.     Left lower leg: No edema.  Skin:    General: Skin is warm and dry.  Neurological:     Mental Status: She is alert and oriented to person, place, and time.  Psychiatric:        Behavior: Behavior normal.     ED Results / Procedures / Treatments   Labs (all labs ordered are listed, but only abnormal results are displayed) Labs Reviewed  COMPREHENSIVE METABOLIC PANEL - Abnormal; Notable for the following components:      Result Value   Sodium 134 (*)    Total Protein 6.4 (*)    Albumin 3.1 (*)    Total Bilirubin 0.2 (*)    All other components within normal limits  CBC WITH DIFFERENTIAL/PLATELET - Abnormal; Notable for the following components:   RBC 3.80 (*)    Hemoglobin 11.2 (*)    HCT 34.3 (*)    Lymphs Abs 4.2 (*)    All other components within normal limits  BRAIN NATRIURETIC PEPTIDE  TSH  I-STAT CREATININE, ED  I-STAT BETA HCG BLOOD, ED ([facility], [facility], [facility] ONLY)    EKG EKG Interpretation  Date/Time:  Friday Aug 09 2020 18:24:55 EDT Ventricular Rate:  79 PR Interval:  144 QRS Duration: 96 QT Interval:  394 QTC Calculation: 451 R Axis:   1 Text Interpretation: Sinus rhythm with occasional Premature ventricular complexes Minimal voltage criteria for LVH, may be normal variant ( R in aVL ) Borderline ECG No significant change since last tracing Confirmed by Melene Plan [phone]) on 08/10/2020 7:53:55 AM   Radiology DG Chest 2 View  Result Date: 08/09/2020 CLINICAL DATA:   Shortness of breath. EXAM: CHEST - 2 VIEW COMPARISON:  March 07, 2019 FINDINGS: The heart size and mediastinal  contours are within normal limits. Both lungs are clear. The visualized skeletal structures are unremarkable. IMPRESSION: No active cardiopulmonary disease. Electronically Signed   By: Aram Candela M.D.   On: 08/09/2020 20:08    Procedures Procedures   Medications Ordered in ED Medications - No data to display  ED Course  I have reviewed the triage vital signs and the nursing notes.  Pertinent labs & imaging results that were available during my care of the patient were reviewed by me and considered in my medical decision making (see chart for details).    MDM Rules/Calculators/A&P                          CC:sob VS:  Vitals:   08/09/20 1847 08/09/20 2048 08/09/20 2200 08/09/20 2340  BP: 107/67 117/78 (!) 104/59 106/62  Pulse: 75 74 82 78  Resp: 16 16 17 18   Temp: 99 F (37.2 C)     TempSrc: Oral     SpO2: 100% 100% 100% 100%    is gathered by pt and emr. Previous records obtained and reviewed. DDX:The patient's complaint of sob involves an extensive number of diagnostic and treatment options, and is a complaint that carries with it a high risk of complications, morbidity, and potential mortality. Given the large differential diagnosis, medical decision making is of high complexity. The emergent differential diagnosis for shortness of breath includes, but is not limited to, Pulmonary edema, bronchoconstriction, Pneumonia, Pulmonary embolism, Pneumotherax/ Hemothorax, Dysrythmia, ACS, anemia.   Labs: I ordered reviewed and interpreted labs which include Cbc- mild anemai cmp- mildly low protein- insignificant TSH,HCG, BNP- WNL  Imaging: I ordered and reviewed images which included  2 v cxr. I independently visualized and interpreted all imaging. There are no acute, significant findings on today's images. EKG: NSR --r of 79 Consults: MDM: Patient here  with sob- suspect anxiety / hyperventilation syndrome- recent weight gain- could be sleep apnea. Does not appear to be emergent- acs, PE, CHF, symptomatic anemia. F/U w/ OP PCP. Discussed return precautions Patient disposition:The patient appears reasonably screened and/or stabilized for discharge and I doubt any other medical condition or other [facility] requiring further screening, evaluation, or treatment in the ED at this time prior to discharge. I have discussed lab and/or imaging findings with the patient and answered all questions/concerns to the best of my ability.I have discussed return precautions and OP follow up.    Final Clinical Impression(s) / ED Diagnoses Final diagnoses:  Acute hyperventilation syndrome    Rx / DC Orders ED Discharge Orders    None       [facility], PA-C 08/10/20 1004    10/10/20, MD 08/10/20 1904

## 2020-08-09 NOTE — ED Provider Notes (Addendum)
Emergency Medicine Provider Triage Evaluation Note  Claudia Gonzales , a 30 y.o. female  was evaluated in triage.  Pt complains of shortness of breath.  Patient reports that about 30-45 minutes prior to arrival she laid down and then suddenly woke up feeling like she was being choked and could not breathe, she was not being choked, symptoms lasted 10 to 15 seconds but since then she has been feeling a heavy sensation throughout her body and still feels a bit short of breath.  She tried using her inhaler and that made her feel jittery but did not really improve her symptoms.  Upon arriving here in triage she reports that she thinks her symptoms may all be related to depression but denies any SI or HI.  Patient is tearful.  Review of Systems  Positive: Shortness of breath, Depression Negative: Chest pain, HI, SI, abdominal pain, vomiting  Physical Exam  BP 107/67 (BP Location: Left Arm)   Pulse 75   Temp 99 F (37.2 C) (Oral)   Resp 16   SpO2 100%  Gen:   Awake, no distress   Resp:  Normal effort, CTA bilat MSK:   Moves extremities without difficulty  Other:    Medical Decision Making  Medically screening exam initiated at 6:31 PM.  Appropriate orders placed.  Claudia Gonzales was informed that the remainder of the evaluation will be completed by another provider, this initial triage assessment does not replace that evaluation, and the importance of remaining in the ED until their evaluation is complete.     Dartha Lodge, PA-C 08/09/20 1845    Dartha Lodge, PA-C 08/09/20 1851    Gerhard Munch, MD 08/09/20 2007

## 2020-08-10 NOTE — Discharge Instructions (Addendum)
Your show very mild anemia should continue taking your iron supplementation.  I do not think this is the cause of your fatigue or shortness of breath.  Your thyroid is within normal limits.  The remainder of your labs are unremarkable and show no evidence of infection or other significant abnormality.  Your pregnancy test is negative.  Your EKG is normal and shows no abnormalities and have a normal chest x-ray. I suspect that your symptoms are related to potential panic attack or stress.  Please follow-up with your therapist.  It does not appear that you have an emergent cause of your symptoms.  You should follow-up with the primary care physician as soon as possible.  Contact a health care provider if: You continue to have episodes of hyperventilation. Your hyperventilation gets worse. Get help right away if: You pass out due to hyperventilation. You have continued numbness after a period of hyperventilation.

## 2020-08-10 NOTE — ED Notes (Signed)
Discharge instructions discussed with pt. Pt verbalized understanding. Pt stable and ambulatory. No signature pad available. 

## 2020-10-15 IMAGING — DX DG CHEST 1V PORT
1 series · 1 of 1 positions shown · non-contrast
Comparison: 02/21/2019

CLINICAL DATA: COVID.  Congestion

EXAM:
PORTABLE CHEST 1 VIEW

[chest ap]
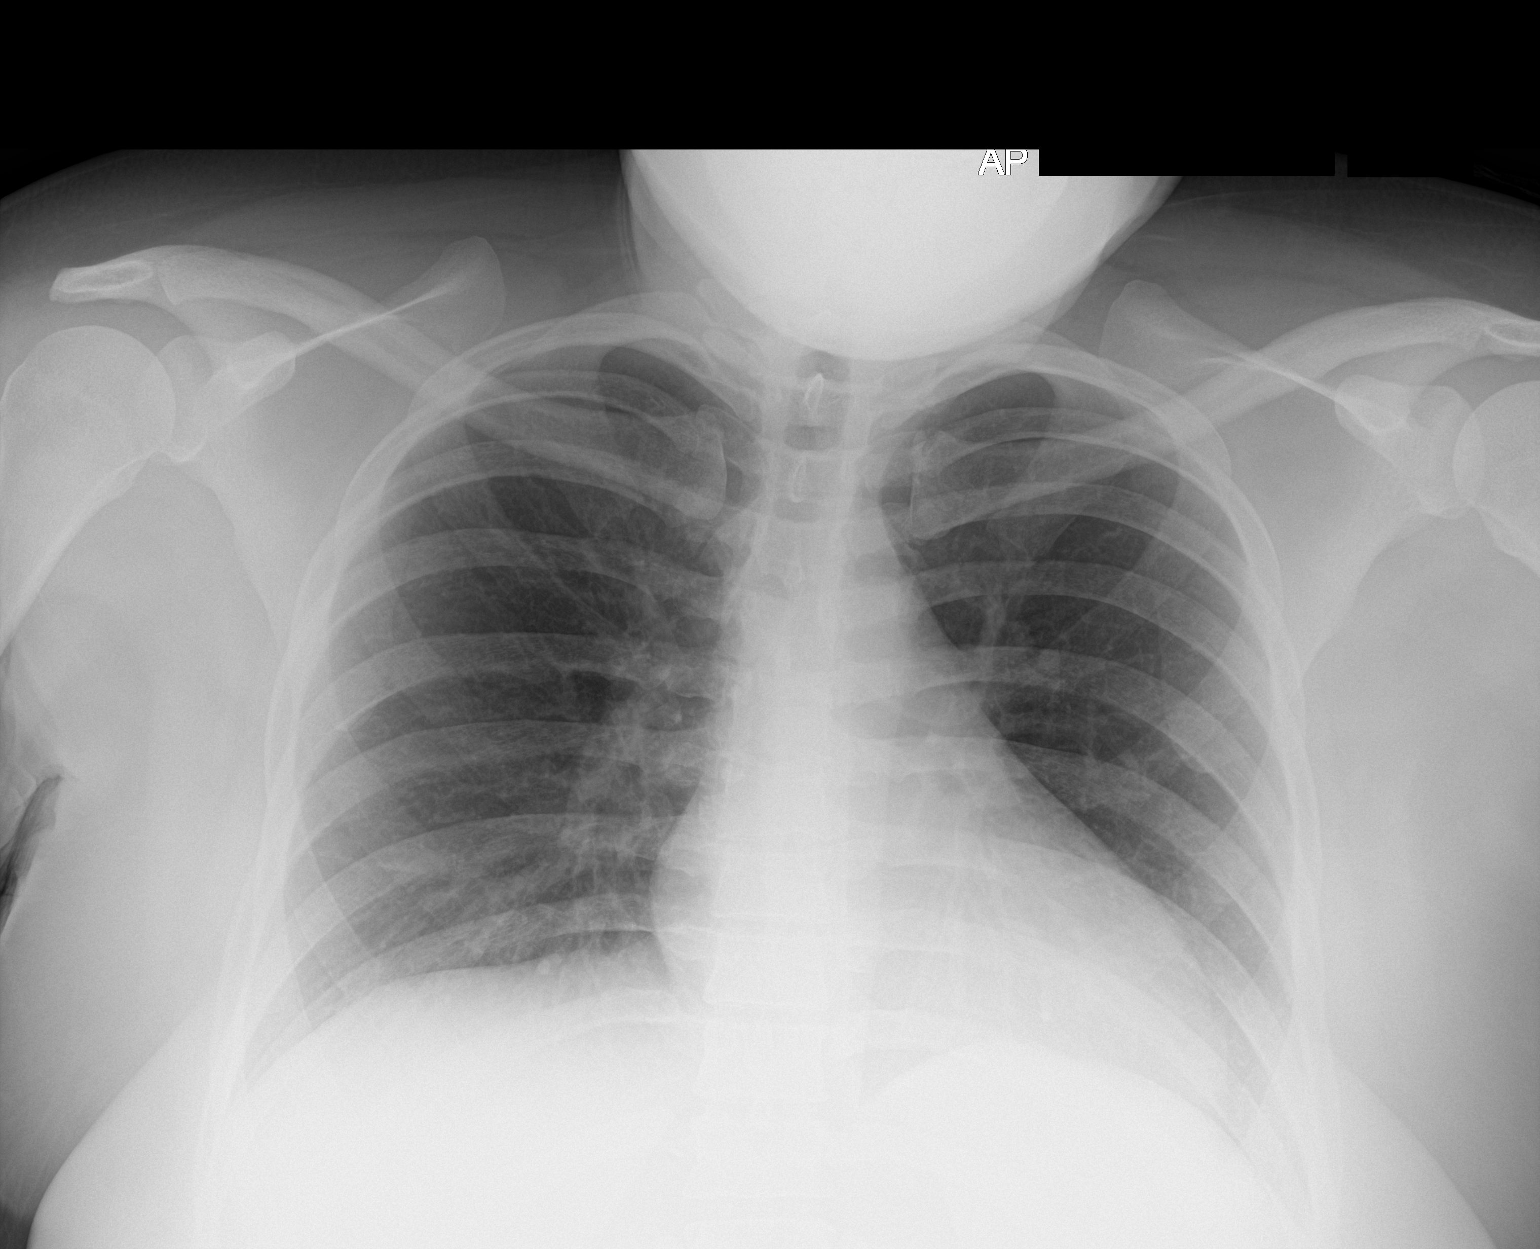

[1 of 1 positions shown; findings below may reference images not displayed]

FINDINGS: Heart and mediastinal contours are within normal limits. No focal
opacities or effusions. No acute bony abnormality.
IMPRESSION: No active disease.

## 2021-03-10 ENCOUNTER — Other Ambulatory Visit: Payer: Self-pay

## 2021-03-10 ENCOUNTER — Emergency Department (HOSPITAL_COMMUNITY): Payer: Medicaid Other

## 2021-03-10 ENCOUNTER — Emergency Department (HOSPITAL_COMMUNITY)
Admission: EM | Admit: 2021-03-10 | Discharge: 2021-03-10 | Discharge status: Against Medical Advice | Payer: Medicaid Other | Source: Home / Self Care

## 2021-03-10 ENCOUNTER — Encounter (HOSPITAL_COMMUNITY): Payer: Self-pay

## 2021-03-10 ENCOUNTER — Emergency Department (HOSPITAL_COMMUNITY)
Admission: EM | Admit: 2021-03-10 | Discharge: 2021-03-11 | Disposition: A | Discharge status: Home / Self Care | Payer: Medicaid Other | Attending: Physician Assistant | Admitting: Physician Assistant

## 2021-03-10 DIAGNOSIS — M545 Low back pain, unspecified: Secondary | ICD-10-CM | POA: Diagnosis present

## 2021-03-10 DIAGNOSIS — W01198A Fall on same level from slipping, tripping and stumbling with subsequent striking against other object, initial encounter: Secondary | ICD-10-CM | POA: Diagnosis not present

## 2021-03-10 DIAGNOSIS — Z5321 Procedure and treatment not carried out due to patient leaving prior to being seen by health care provider: Secondary | ICD-10-CM | POA: Diagnosis not present

## 2021-03-10 NOTE — ED Provider Notes (Signed)
Emergency Medicine Provider Triage Evaluation Note  Claudia Gonzales , a 30 y.o. female  was evaluated in triage.  Pt complains of low back pain.  Reports a history of intermittent low back pain.  Typically worse just prior to her menstrual cycle.  Yesterday she fell on the stairs striking her buttock.  She states that initially she was not having much pain but woke up this morning with tingling in the lower extremities.  Reports some associated weakness in the legs.  States the pain is now intermittent.  Physical Exam  BP 129/85   Pulse 82   Temp 97.9 F (36.6 C) (Oral)   Resp 18   SpO2 99%  Gen:   Awake, no distress   Resp:  Normal effort  MSK:   Moves extremities without difficulty  Other:  Strength is 5/5 in the bilateral lower extremities.  Distal sensation intact.  2+ pedal pulses.  Medical Decision Making  Medically screening exam initiated at 11:10 PM.  Appropriate orders placed.  Luster Landsberg Barona was informed that the remainder of the evaluation will be completed by another provider, this initial triage assessment does not replace that evaluation, and the importance of remaining in the ED until their evaluation is complete.   Placido Sou, PA-C 03/10/21 2310    Charlynne Pander, MD 03/10/21 [phone]

## 2021-03-10 NOTE — ED Triage Notes (Signed)
Pt states she has excruciating back pain with her menstrual cycle for the last 6 months. Pt states yesterday she fell down the stairs and has been experiencing some numbness and tingling to legs bilaterally, more so on the left. Pt states she also had some back pain with the leg numbness and tingling. Pt states the back pain comes and goes.

## 2021-03-11 ENCOUNTER — Emergency Department (HOSPITAL_COMMUNITY): Payer: Medicaid Other

## 2021-03-11 LAB — HCG, QUANTITATIVE, PREGNANCY: hCG, Beta Chain, Quant, S: <1 m[IU]/mL (ref <5)

## 2021-07-22 ENCOUNTER — Ambulatory Visit (HOSPITAL_COMMUNITY)
Admission: EM | Admit: 2021-07-22 | Discharge: 2021-07-22 | Disposition: A | Discharge status: Home / Self Care | Payer: Medicaid Other | Attending: Nurse Practitioner | Admitting: Nurse Practitioner

## 2021-07-22 ENCOUNTER — Encounter (HOSPITAL_COMMUNITY): Payer: Self-pay

## 2021-07-22 DIAGNOSIS — Z20818 Contact with and (suspected) exposure to other bacterial communicable diseases: Secondary | ICD-10-CM | POA: Diagnosis not present

## 2021-07-22 DIAGNOSIS — J029 Acute pharyngitis, unspecified: Secondary | ICD-10-CM | POA: Diagnosis not present

## 2021-07-22 MED ORDER — AMOXICILLIN 500 MG PO CAPS: 500.0000 mg | ORAL_CAPSULE | Freq: Two times a day (BID) | ORAL | 0 refills | Status: DC

## 2021-07-22 NOTE — ED Triage Notes (Signed)
Pt states her son dx with bacteria in his throat and she woke up with a scratchy throat and body aches this morning.  ?

## 2021-07-22 NOTE — ED Provider Notes (Signed)
?[name] ? ? ? ?CSN: [id number] ?Arrival date & time: 07/22/21  1917 ? ? ?  ? ?History   ?Chief Complaint ?Chief Complaint  ?Patient presents with  ? Sore Throat  ? ? ?HPI ?Claudia Gonzales is a 31 y.o. female.  ? ?Patient presents with 1 day of scratchy throat, body aches, headache abdominal pain, and nausea.  She denies vomiting, chest pain, shortness of breath, significant cough/congestion, ear pain or drainage, decreased appetite.  She has taken Advil sinus without relief of symptoms. ? ?She reports her son was diagnosed with strep throat today.  She has been around her son since Friday.  Her son symptoms started about a week ago. ? ? ?Past Medical History:  ?Diagnosis Date  ? Anxiety   ? Asthma   ? Chlamydia   ? Depression   ? Polyarthralgia 04/20/2018  ? Preterm labor 2011  ? ? ?Patient Active Problem List  ? Diagnosis Date Noted  ? Bacterial vaginosis 04/22/2018  ? DGI (disseminated gonococcal infection) ([place]) 04/21/2018  ? Polyarthralgia 04/20/2018  ? Encounter for insertion of mirena IUD 10/03/2012  ? Traumatic injury during pregnancy 07/21/2012  ? Supervision of high risk pregnancy in third trimester 06/13/2012  ? H/O premature delivery 05/02/2012  ? Anxiety and depression 05/02/2012  ? ? ?Past Surgical History:  ?Procedure Laterality Date  ? APPENDECTOMY    ? WISDOM TOOTH EXTRACTION    ? ? ?OB History   ? ? Gravida  ?3  ? Para  ?2  ? Term  ?1  ? Preterm  ?1  ? AB  ?   ? Living  ?2  ?  ? ? SAB  ?   ? IAB  ?   ? Ectopic  ?   ? Multiple  ?   ? Live Births  ?2  ?   ?  ?  ? ? ? ?Home Medications   ? ?Prior to Admission medications   ?Medication Sig Start Date End Date Taking? Authorizing Provider  ?amoxicillin (AMOXIL) 500 MG capsule Take 1 capsule (500 mg total) by mouth 2 (two) times daily. 07/22/21  Yes Eulogio Bear, NP  ?PROAIR HFA 108 (90 Base) MCG/ACT inhaler Inhale 1 puff into the lungs every 6 (six) hours as needed for wheezing or shortness of breath. 03/28/20   [provider]  ?fluticasone (FLONASE) 50 MCG/ACT nasal spray Place 1 spray into both nostrils daily. ?Patient taking differently: Place 1 spray into both nostrils daily as needed for allergies or rhinitis.  01/23/18 03/12/19  Volanda Napoleon, PA-C  ? ? ?Family History ?Family History  ?Problem Relation Age of Onset  ? Hypertension Mother   ? Cancer Father   ?     colon  ? Cancer Maternal Grandmother   ? Cancer Maternal Grandfather   ? Cancer Paternal Grandmother   ? Cancer Paternal Grandfather   ? ? ?Social History ?Social History  ? ?Tobacco Use  ? Smoking status: Former  ?  Packs/day: 0.25  ?  Years: 1.00  ?  Pack years: 0.25  ?  Types: Cigarettes  ?  Quit date: 06/17/2012  ?  Years since quitting: 9.1  ? Smokeless tobacco: Never  ?Vaping Use  ? Vaping Use: Never used  ?Substance Use Topics  ? Alcohol use: Yes  ?  Comment: rarely  ? Drug use: Not Currently  ?  Types: Marijuana  ? ? ? ?Allergies   ?Patient has no known allergies. ? ? ?Review of  Systems ?Review of Systems ?Per HPI ? ?Physical Exam ?Triage Vital Signs ?ED Triage Vitals  ?Enc Vitals Group  ?   BP 07/22/21 1943 128/74  ?   Pulse Rate 07/22/21 1943 81  ?   Resp 07/22/21 1943 18  ?   Temp 07/22/21 1943 98.8 ?F (37.1 ?C)  ?   Temp Source 07/22/21 1943 Oral  ?   SpO2 07/22/21 1943 99 %  ?   Weight --   ?   Height --   ?   Head Circumference --   ?   Peak Flow --   ?   Pain Score 07/22/21 1944 6  ?   Pain Loc --   ?   Pain Edu? --   ?   Excl. in [place]? --   ? ?No data found. ? ?Updated Vital Signs ?BP 128/74 (BP Location: Left Arm)   Pulse 81   Temp 98.8 ?F (37.1 ?C) (Oral)   Resp 18   LMP 07/07/2021   SpO2 99%  ? ?Visual Acuity ?Right Eye Distance:   ?Left Eye Distance:   ?Bilateral Distance:   ? ?Right Eye Near:   ?Left Eye Near:    ?Bilateral Near:    ? ?Physical Exam ?Vitals and nursing note reviewed.  ?Constitutional:   ?   General: She is not in acute distress. ?   Appearance: She is well-developed. She is not toxic-appearing.  ?HENT:  ?   Head:  Normocephalic and atraumatic.  ?   Right Ear: Tympanic membrane and ear canal normal. No drainage, swelling or tenderness. No middle ear effusion. Tympanic membrane is not erythematous.  ?   Left Ear: Tympanic membrane and ear canal normal. No drainage, swelling or tenderness.  No middle ear effusion. Tympanic membrane is not erythematous.  ?   Nose: No congestion or rhinorrhea.  ?   Mouth/Throat:  ?   Mouth: Mucous membranes are dry.  ?   Pharynx: Posterior oropharyngeal erythema present. No oropharyngeal exudate or uvula swelling.  ?   Tonsils: No tonsillar exudate or tonsillar abscesses. 0 on the right. 0 on the left.  ?Cardiovascular:  ?   Rate and Rhythm: Normal rate and regular rhythm.  ?Pulmonary:  ?   Effort: Pulmonary effort is normal. No respiratory distress.  ?   Breath sounds: No wheezing, rhonchi or rales.  ?Musculoskeletal:  ?   Cervical back: Normal range of motion and neck supple.  ?Lymphadenopathy:  ?   Cervical: No cervical adenopathy.  ?Skin: ?   General: Skin is warm and dry.  ?   Capillary Refill: Capillary refill takes less than 2 seconds.  ?   Coloration: Skin is not pale.  ?   Findings: No erythema or rash.  ?Neurological:  ?   Mental Status: She is alert and oriented to person, place, and time.  ? ? ? ?UC Treatments / Results  ?Labs ?(all labs ordered are listed, but only abnormal results are displayed) ?Labs Reviewed - No data to display ? ?EKG ? ? ?Radiology ?No results found. ? ?Procedures ?Procedures (including critical care time) ? ?Medications Ordered in UC ?Medications - No data to display ? ?Initial Impression / Assessment and Plan / UC Course  ?I have reviewed the triage vital signs and the nursing notes. ? ?Pertinent labs & imaging results that were available during my care of the patient were reviewed by me and considered in my medical decision making (see chart for details). ? ?  ?Given known  positive strep throat exposure, will empirically treat with amoxicillin 500 mg twice  daily for 10 days.  Encouraged changing toothbrush after treatment.  She is considered contagious until she has been on antibiotics 24 hours-will give note for work.  Seek care if symptoms persist or worsen despite treatment. ?Final Clinical Impressions(s) / UC Diagnoses  ? ?Final diagnoses:  ?Acute pharyngitis, unspecified etiology  ?Exposure to strep throat  ? ? ? ?Discharge Instructions   ? ?  ?- Please start the amoxicillin 500 mg twice daily for 10 days for possible strep throat ?-We are treating you since you have a known exposure ?-Please change your toothbrush after you finish treatment ?-You are considered contagious until you have been on antibiotic for 24 hours ? ? ? ? ?ED Prescriptions   ? ? Medication Sig Dispense Auth. Provider  ? amoxicillin (AMOXIL) 500 MG capsule Take 1 capsule (500 mg total) by mouth 2 (two) times daily. 20 capsule Eulogio Bear, NP  ? ?  ? ?PDMP not reviewed this encounter. ?  ?Eulogio Bear, NP ?07/22/21 2010 ? ?

## 2021-07-22 NOTE — Discharge Instructions (Addendum)
-   Please start the amoxicillin 500 mg twice daily for 10 days for possible strep throat ?-We are treating you since you have a known exposure ?-Please change your toothbrush after you finish treatment ?-You are considered contagious until you have been on antibiotic for 24 hours ?- Seek care if your symptoms worsen or persist despite treatment ?

## 2022-01-19 ENCOUNTER — Encounter (HOSPITAL_COMMUNITY): Payer: Self-pay

## 2022-01-19 ENCOUNTER — Ambulatory Visit (HOSPITAL_COMMUNITY)
Admission: EM | Admit: 2022-01-19 | Discharge: 2022-01-19 | Disposition: A | Discharge status: Home / Self Care | Payer: Medicaid Other | Attending: Family Medicine | Admitting: Family Medicine

## 2022-01-19 DIAGNOSIS — M549 Dorsalgia, unspecified: Secondary | ICD-10-CM

## 2022-01-19 MED ORDER — KETOROLAC TROMETHAMINE 30 MG/ML IJ SOLN
INTRAMUSCULAR | Status: AC
  Filled 2022-01-19: qty 1

## 2022-01-19 MED ORDER — KETOROLAC TROMETHAMINE 30 MG/ML IJ SOLN
30.0000 mg | Freq: Once | INTRAMUSCULAR | Status: AC
  Administered 2022-01-19: 30 mg via INTRAMUSCULAR

## 2022-01-19 MED ORDER — IBUPROFEN 800 MG PO TABS: 800.0000 mg | ORAL_TABLET | Freq: Three times a day (TID) | ORAL | 0 refills | Status: AC | PRN

## 2022-01-19 MED ORDER — TIZANIDINE HCL 4 MG PO TABS: 4.0000 mg | ORAL_TABLET | Freq: Three times a day (TID) | ORAL | 0 refills | Status: DC | PRN

## 2022-01-19 NOTE — ED Provider Notes (Signed)
[facility]    CSN: [id number] Arrival date & time: 01/19/22  1716      History   Chief Complaint Chief Complaint  Patient presents with   Back Pain    HPI Claudia Gonzales is a 31 y.o. female.    Back Pain  Here for pain in her mid back since yesterday. She first noted it after a nap yesterday; she works nights at [employer].  No trauma or fall.  She does work Careers adviser all day long.  No fever or chills and no rash.  It is not really pleuritic; she feels that movement of her chest or arms is the main thing that makes it feel worse.  No new cough or fever  Last menstrual cycle was about 3 weeks ago  Past Medical History:  Diagnosis Date   Anxiety    Asthma    Chlamydia    Depression    Polyarthralgia 04/20/2018   Preterm labor 2011    Patient Active Problem List   Diagnosis Date Noted   Bacterial vaginosis 04/22/2018   DGI (disseminated gonococcal infection) ([place]) 04/21/2018   Polyarthralgia 04/20/2018   Encounter for insertion of mirena IUD 10/03/2012   Traumatic injury during pregnancy 07/21/2012   Supervision of high risk pregnancy in third trimester 06/13/2012   H/O premature delivery 05/02/2012   Anxiety and depression 05/02/2012    Past Surgical History:  Procedure Laterality Date   APPENDECTOMY     WISDOM TOOTH EXTRACTION      OB History     Gravida  3   Para  2   Term  1   Preterm  1   AB      Living  2      SAB      IAB      Ectopic      Multiple      Live Births  2            Home Medications    Prior to Admission medications   Medication Sig Start Date End Date Taking? Authorizing Provider  ibuprofen (ADVIL) 800 MG tablet Take 1 tablet (800 mg total) by mouth every 8 (eight) hours as needed (pain). 01/19/22  Yes Aleisha Paone, Gwenlyn Perking, MD  tiZANidine (ZANAFLEX) 4 MG tablet Take 1 tablet (4 mg total) by mouth every 8 (eight) hours as needed for muscle spasms. 01/19/22  Yes Saiquan Hands, Gwenlyn Perking, MD   PROAIR HFA 108 [phone] Base) MCG/ACT inhaler Inhale 1 puff into the lungs every 6 (six) hours as needed for wheezing or shortness of breath. 03/28/20   [provider]  fluticasone (FLONASE) 50 MCG/ACT nasal spray Place 1 spray into both nostrils daily. Patient taking differently: Place 1 spray into both nostrils daily as needed for allergies or rhinitis.  01/23/18 03/12/19  Volanda Napoleon, PA-C    Family History Family History  Problem Relation Age of Onset   Hypertension Mother    Cancer Father        colon   Cancer Maternal Grandmother    Cancer Maternal Grandfather    Cancer Paternal Grandmother    Cancer Paternal Grandfather     Social History Social History   Tobacco Use   Smoking status: Former    Packs/day: 0.25    Years: 1.00    Total pack years: 0.25    Types: Cigarettes    Quit date: 06/17/2012    Years since quitting: 9.5   Smokeless tobacco: Never  Vaping Use   Vaping Use: Never used  Substance Use Topics   Alcohol use: Yes    Comment: rarely   Drug use: Not Currently    Types: Marijuana     Allergies   Patient has no known allergies.   Review of Systems Review of Systems  Musculoskeletal:  Positive for back pain.     Physical Exam Triage Vital Signs ED Triage Vitals  Enc Vitals Group     BP 01/19/22 1838 109/71     Pulse Rate 01/19/22 1838 82     Resp 01/19/22 1838 16     Temp 01/19/22 1838 98.7 F (37.1 C)     Temp Source 01/19/22 1838 Oral     SpO2 01/19/22 1838 100 %     Weight --      Height --      Head Circumference --      Peak Flow --      Pain Score 01/19/22 1835 7     Pain Loc --      Pain Edu? --      Excl. in [place]? --    No data found.  Updated Vital Signs BP 109/71 (BP Location: Left Arm)   Pulse 82   Temp 98.7 F (37.1 C) (Oral)   Resp 16   LMP  (LMP Unknown)   SpO2 100%   Visual Acuity Right Eye Distance:   Left Eye Distance:   Bilateral Distance:    Right Eye Near:   Left Eye Near:    Bilateral  Near:     Physical Exam Vitals reviewed.  Constitutional:      General: She is not in acute distress.    Appearance: She is not ill-appearing, toxic-appearing or diaphoretic.  Eyes:     Extraocular Movements: Extraocular movements intact.     Pupils: Pupils are equal, round, and reactive to light.  Cardiovascular:     Rate and Rhythm: Normal rate and regular rhythm.     Heart sounds: No murmur heard. Pulmonary:     Effort: Pulmonary effort is normal. No respiratory distress.     Breath sounds: No stridor. No wheezing, rhonchi or rales.  Musculoskeletal:     Cervical back: Neck supple.     Comments: There is some tenderness along the thoracic spine and about T10.  There is no rash or erythema.  Lymphadenopathy:     Cervical: No cervical adenopathy.  Skin:    Capillary Refill: Capillary refill takes less than 2 seconds.     Coloration: Skin is not jaundiced or pale.  Neurological:     General: No focal deficit present.     Mental Status: She is alert and oriented to person, place, and time.  Psychiatric:        Behavior: Behavior normal.      UC Treatments / Results  Labs (all labs ordered are listed, but only abnormal results are displayed) Labs Reviewed - No data to display  EKG   Radiology No results found.  Procedures Procedures (including critical care time)  Medications Ordered in UC Medications  ketorolac (TORADOL) 30 MG/ML injection 30 mg (has no administration in time range)    Initial Impression / Assessment and Plan / UC Course  I have reviewed the triage vital signs and the nursing notes.  Pertinent labs & imaging results that were available during my care of the patient were reviewed by me and considered in my medical decision making (see chart for details).  She is given a shot of Toradol, and I am sending in ibuprofen and tizanidine.  Rest and local heat. Final Clinical Impressions(s) / UC Diagnoses   Final diagnoses:  Mid back pain      Discharge Instructions      You have been given a shot of Toradol 30 mg today.  Take ibuprofen 800 mg--1 tab every 8 hours as needed for pain.   Take tizanidine 4 mg--1 every 8 hours as needed for muscle spasms      ED Prescriptions     Medication Sig Dispense Auth. Provider   ibuprofen (ADVIL) 800 MG tablet Take 1 tablet (800 mg total) by mouth every 8 (eight) hours as needed (pain). 21 tablet Caila Cirelli, Gwenlyn Perking, MD   tiZANidine (ZANAFLEX) 4 MG tablet Take 1 tablet (4 mg total) by mouth every 8 (eight) hours as needed for muscle spasms. 30 tablet Jamaul Heist, Gwenlyn Perking, MD      PDMP not reviewed this encounter.   Barrett Henle, MD 01/19/22 [phone]

## 2022-01-19 NOTE — ED Triage Notes (Signed)
Pt is here for back pain since today and pain when taking a deep breath

## 2022-01-19 NOTE — Discharge Instructions (Addendum)
You have been given a shot of Toradol 30 mg today.  Take ibuprofen 800 mg--1 tab every 8 hours as needed for pain.   Take tizanidine 4 mg--1 every 8 hours as needed for muscle spasms  

## 2022-01-31 ENCOUNTER — Emergency Department (HOSPITAL_COMMUNITY): Payer: Medicaid Other

## 2022-01-31 ENCOUNTER — Other Ambulatory Visit: Payer: Self-pay

## 2022-01-31 ENCOUNTER — Encounter (HOSPITAL_COMMUNITY): Payer: Self-pay

## 2022-01-31 ENCOUNTER — Emergency Department (HOSPITAL_COMMUNITY)
Admission: EM | Admit: 2022-01-31 | Discharge: 2022-01-31 | Disposition: A | Discharge status: Home / Self Care | Payer: Medicaid Other | Attending: Emergency Medicine | Admitting: Emergency Medicine

## 2022-01-31 DIAGNOSIS — X500XXA Overexertion from strenuous movement or load, initial encounter: Secondary | ICD-10-CM | POA: Diagnosis not present

## 2022-01-31 DIAGNOSIS — M546 Pain in thoracic spine: Secondary | ICD-10-CM | POA: Insufficient documentation

## 2022-01-31 DIAGNOSIS — M549 Dorsalgia, unspecified: Secondary | ICD-10-CM

## 2022-01-31 HISTORY — DX: Anemia, unspecified: D64.9

## 2022-01-31 LAB — PREGNANCY, URINE: Preg Test, Ur: NEGATIVE

## 2022-01-31 NOTE — ED Triage Notes (Signed)
This morning patient said she bent down to pick something up and her upper middle back started to hurt. She said now it feels like a bone is in her lungs.

## 2022-01-31 NOTE — ED Notes (Signed)
Pt stated she was leaving and pt seen leaving the lobby

## 2022-01-31 NOTE — ED Provider Notes (Signed)
[place] DEPT Provider Note   CSN: [id number] Arrival date & time: 01/31/22  8250     History  Chief Complaint  Patient presents with   Back Pain    Hermena Swint Barbar is a 31 y.o. female with history of anxiety, depression and anemia who presents to the ED for evaluation of upper back pain that started about one hour prior to arrival. She notes that she was in the shower and was bending over to pick something up when she suddenly felt pain in her left upper back underneath her scapular. Pain is not reproducible and she reports that it feels "deeper". She describes the sensations "as if a bone broke up and is stabbing me in the lung." Has some pleuritic pain with it but no SOB or chest pain. She was seen by urgent care on 01/20/23 with complaints of mid back pain as well and was giving Toradol injection along with ibuprofen and tizanidine. 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 No previous history of cardiac disease. Denies contraceptive use, unilateral leg swelling/tenderness, recent long travel or previous history of blood clots. She took 800mg  ibuprofen without relief.    Back Pain      Home Medications Prior to Admission medications   Medication Sig Start Date End Date Taking? Authorizing Provider  ibuprofen (ADVIL) 800 MG tablet Take 1 tablet (800 mg total) by mouth every 8 (eight) hours as needed (pain). 01/19/22   Barrett Henle, MD  PROAIR HFA 108 [phone] Base) MCG/ACT inhaler Inhale 1 puff into the lungs every 6 (six) hours as needed for wheezing or shortness of breath. 03/28/20   [provider]  tiZANidine (ZANAFLEX) 4 MG tablet Take 1 tablet (4 mg total) by mouth every 8 (eight) hours as needed for muscle spasms. 01/19/22   Barrett Henle, MD  fluticasone (FLONASE) 50 MCG/ACT nasal spray Place 1 spray into both nostrils daily. Patient taking differently: Place 1 spray into both nostrils daily as  needed for allergies or rhinitis.  01/23/18 03/12/19  Volanda Napoleon, PA-C      Allergies    Patient has no known allergies.    Review of Systems   Review of Systems  Musculoskeletal:  Positive for back pain.    Physical Exam Updated Vital Signs BP (!) 143/94 (BP Location: Right Arm)   Pulse 64   Temp 98.1 F (36.7 C) (Oral)   Resp 16   Ht 5\' 7"  (1.702 m)   Wt 124.7 kg   LMP 01/26/2022   SpO2 100%   BMI 43.07 kg/m  Physical Exam Vitals and nursing note reviewed.  Constitutional:      General: She is not in acute distress.    Appearance: She is not ill-appearing.  HENT:     Head: Atraumatic.  Eyes:     Conjunctiva/sclera: Conjunctivae normal.  Cardiovascular:     Rate and Rhythm: Normal rate and regular rhythm.     Pulses: Normal pulses.     Heart sounds: No murmur heard. Pulmonary:     Effort: Pulmonary effort is normal. No respiratory distress.     Breath sounds: Normal breath sounds.  Abdominal:     General: Abdomen is flat. There is no distension.     Palpations: Abdomen is soft.     Tenderness: There is no abdominal tenderness.  Musculoskeletal:        General: Normal range of motion.     Cervical  back: Normal range of motion.     Comments: Full truncal range of motion. No midline spinal tenderness. States pain is under left scapula though is not reproducible with palpation. No bruising, cellulitic changes or swelling  Skin:    General: Skin is warm and dry.     Capillary Refill: Capillary refill takes less than 2 seconds.  Neurological:     General: No focal deficit present.     Mental Status: She is alert.  Psychiatric:        Mood and Affect: Mood normal.     ED Results / Procedures / Treatments   Labs (all labs ordered are listed, but only abnormal results are displayed) Labs Reviewed  PREGNANCY, URINE    EKG None  Radiology DG Thoracic Spine 2 View  Result Date: 01/31/2022 CLINICAL DATA:  Back injury after picking something up and  developing pain in the middle of back EXAM: THORACIC SPINE 2 VIEWS COMPARISON:  Chest radiograph 08/09/2020 FINDINGS: There is a mild curvature of the thoracic spine which appears convex towards the right. Of the alignment of the thoracic spine is otherwise within normal limits. The vertebral body heights are maintained. No signs of acute fracture or subluxation. IMPRESSION: 1. No acute findings. 2. Mild curvature of the thoracic spine. Electronically Signed   By: Signa Kell M.D.   On: 01/31/2022 12:21   DG Scapula Left  Result Date: 01/31/2022 CLINICAL DATA:  Left scapular pain. EXAM: LEFT SCAPULA - 2+ VIEWS COMPARISON:  Same day chest radiograph. FINDINGS: There is no evidence of fracture or other focal bone lesions. Soft tissues are unremarkable. IMPRESSION: Negative. Electronically Signed   By: Romona Curls M.D.   On: 01/31/2022 12:20   DG Chest 2 View  Result Date: 01/31/2022 CLINICAL DATA:  Back pain and left scapular pain. EXAM: CHEST - 2 VIEW COMPARISON:  Chest radiograph dated 08/09/2020. FINDINGS: The heart size and mediastinal contours are within normal limits. Both lungs are clear. The visualized skeletal structures are unremarkable. IMPRESSION: No active cardiopulmonary disease. Electronically Signed   By: Romona Curls M.D.   On: 01/31/2022 12:19    Procedures Procedures    Medications Ordered in ED Medications - No data to display  ED Course/ Medical Decision Making/ A&P                           Medical Decision Making Amount and/or Complexity of Data Reviewed Labs: ordered. Radiology: ordered.  31 year old female presents to the emergency department for evaluation of mid back pain worse underneath the left scapula.  Vitals without significant abnormality.  Differentials include muscle strain, compression fracture, muscle spasm, PE.  On exam, no reproducible muscular tenderness, no spinal tenderness.  Full truncal range of motion.  Lungs CTA bilaterally. Patient is  PERC negative, Well's score 0.  Concern for PE is low.  I ordered x-rays including chest x-ray, thoracic spine and scapular x-ray without acute findings.  No discernible cause of patient's symptoms although most likely due to muscular injury given recent visit to urgent care with similar complaints.  Advised to continue using prescribed ibuprofen and tizanidine.  Follow-up with PCP as needed.  Patient expresses understanding and is amenable to plan.  Final Clinical Impression(s) / ED Diagnoses Final diagnoses:  Mid back pain on left side    Rx / DC Orders ED Discharge Orders     None         Janell Quiet,  PA-C 01/31/22 1313    Cathren Laine, MD 02/03/22 1119

## 2022-01-31 NOTE — Discharge Instructions (Signed)
Your x-rays today were all normal.  There is no evidence that there are problems with your bones of your lungs.  You can take ibuprofen as needed for pain and discomfort.  You have the muscle relaxer provided by urgent care that you can use at nighttime to help with sleep and discomfort.  Please incorporate stretches and movement to help with healing although back pain can take several weeks to fully resolve.  Follow-up with your primary care as needed.

## 2022-05-10 ENCOUNTER — Encounter (HOSPITAL_COMMUNITY): Payer: Self-pay | Admitting: *Deleted

## 2022-05-10 ENCOUNTER — Other Ambulatory Visit: Payer: Self-pay

## 2022-05-10 ENCOUNTER — Ambulatory Visit (HOSPITAL_COMMUNITY)
Admission: EM | Admit: 2022-05-10 | Discharge: 2022-05-10 | Disposition: A | Discharge status: Home / Self Care | Payer: Medicaid Other | Attending: Internal Medicine | Admitting: Internal Medicine

## 2022-05-10 DIAGNOSIS — R051 Acute cough: Secondary | ICD-10-CM

## 2022-05-10 DIAGNOSIS — J019 Acute sinusitis, unspecified: Secondary | ICD-10-CM

## 2022-05-10 DIAGNOSIS — B9689 Other specified bacterial agents as the cause of diseases classified elsewhere: Secondary | ICD-10-CM | POA: Diagnosis not present

## 2022-05-10 MED ORDER — BENZONATATE 100 MG PO CAPS: 100.0000 mg | ORAL_CAPSULE | Freq: Three times a day (TID) | ORAL | 0 refills | Status: DC

## 2022-05-10 MED ORDER — PROMETHAZINE-DM 6.25-15 MG/5ML PO SYRP: 5.0000 mL | ORAL_SOLUTION | Freq: Every evening | ORAL | 0 refills | Status: DC | PRN

## 2022-05-10 MED ORDER — DEXAMETHASONE SODIUM PHOSPHATE 10 MG/ML IJ SOLN
10.0000 mg | Freq: Once | INTRAMUSCULAR | Status: AC
  Administered 2022-05-10: 10 mg via INTRAMUSCULAR

## 2022-05-10 MED ORDER — DEXAMETHASONE SODIUM PHOSPHATE 10 MG/ML IJ SOLN
INTRAMUSCULAR | Status: AC
  Filled 2022-05-10: qty 1

## 2022-05-10 MED ORDER — AMOXICILLIN-POT CLAVULANATE 875-125 MG PO TABS: 1.0000 | ORAL_TABLET | Freq: Two times a day (BID) | ORAL | 0 refills | Status: DC

## 2022-05-10 NOTE — ED Triage Notes (Signed)
Pt reports 2weeks ago she started having Body aches,sore throat,cough,congestion and fatigue.

## 2022-05-10 NOTE — Discharge Instructions (Signed)
Your evaluation shows you have a bacterial sinus infection plus a viral infection of the upper airways of your lungs. Use the following medicines to help with your symptoms:  - Take antibiotic sent to pharmacy as directed to treat sinus infection. - I gave you a steroid injection in the clinic to help with your cough. - You may use albuterol inhaler 1 to 2 puffs every 4-6 hours as needed for cough, shortness of breath, and wheezing. - Tessalon perles every 8 hours as needed for cough. - Take Promethazine DM cough medication to help with your cough at nighttime so that you are able to sleep. Do not drive, drink alcohol, or go to work while taking this medication since it can make you sleepy. Only take this at nighttime.  - Purchase Mucinex over the counter and take this every 12 hours as needed for nasal congestion and cough.  If you develop any new or worsening symptoms or do not improve in the next 2 to 3 days, please return.  If your symptoms are severe, please go to the emergency room.  Follow-up with your primary care provider for further evaluation and management of your symptoms as well as ongoing wellness visits.  I hope you feel better!

## 2022-05-10 NOTE — ED Provider Notes (Signed)
[facility]    CSN: [id number] Arrival date & time: 05/10/22  1432      History   Chief Complaint Chief Complaint  Patient presents with   Cough   Nasal Congestion   Sore Throat   Headache   Fatigue    HPI Claudia Gonzales is a 32 y.o. female.   Patient presents to urgent care for evaluation of cough, nasal congestion, and wheezing that started on May 01, 2022 (9-10 days ago) after Claudia Gonzales got back from vacation in the [place]. Cough is productive with phlegm and Claudia Gonzales reports intermittent shortness of breath with exertion. Claudia Gonzales is not short of breath at rest and denies chest pain, heart palpitations, extremity weakness, and fever/chills. Reports nasal congestion and facial discomfort. Tolerating food and fluids well without nausea, vomiting, diarrhea, abdominal pain, body aches, and fever/chills. Claudia Gonzales has a history of asthma and states this is well-controlled with as needed use of albuterol inhaler. Claudia Gonzales last used her inhaler a few hours before coming to urgent care due to shortness of breath and wheeze with relief. Claudia Gonzales is a former cigarette smoker and denies drug use. Claudia Gonzales has been using over the counter medications without relief.    Cough Associated symptoms: headaches   Sore Throat Associated symptoms include headaches.  Headache Associated symptoms: cough     Past Medical History:  Diagnosis Date   Anemia    Anxiety    Asthma    Chlamydia    Depression    Polyarthralgia 04/20/2018   Preterm labor 2011    Patient Active Problem List   Diagnosis Date Noted   Bacterial vaginosis 04/22/2018   DGI (disseminated gonococcal infection) (HCC) 04/21/2018   Polyarthralgia 04/20/2018   Encounter for insertion of mirena IUD 10/03/2012   Traumatic injury during pregnancy 07/21/2012   Supervision of high risk pregnancy in third trimester 06/13/2012   H/O premature delivery 05/02/2012   Anxiety and depression 05/02/2012    Past Surgical History:  Procedure  Laterality Date   APPENDECTOMY     WISDOM TOOTH EXTRACTION      OB History     Gravida  3   Para  2   Term  1   Preterm  1   AB      Living  2      SAB      IAB      Ectopic      Multiple      Live Births  2            Home Medications    Prior to Admission medications   Medication Sig Start Date End Date Taking? Authorizing Provider  amoxicillin-clavulanate (AUGMENTIN) 875-125 MG tablet Take 1 tablet by mouth every 12 (twelve) hours. 05/10/22  Yes Carlisle Beers, FNP  benzonatate (TESSALON) 100 MG capsule Take 1 capsule (100 mg total) by mouth every 8 (eight) hours. 05/10/22  Yes Carlisle Beers, FNP  promethazine-dextromethorphan (PROMETHAZINE-DM) 6.25-15 MG/5ML syrup Take 5 mLs by mouth at bedtime as needed for cough. 05/10/22  Yes Carlisle Beers, FNP  ibuprofen (ADVIL) 800 MG tablet Take 1 tablet (800 mg total) by mouth every 8 (eight) hours as needed (pain). 01/19/22   Zenia Resides, MD  PROAIR HFA 108 [phone] Base) MCG/ACT inhaler Inhale 1 puff into the lungs every 6 (six) hours as needed for wheezing or shortness of breath. 03/28/20   [provider]  tiZANidine (ZANAFLEX) 4 MG tablet Take 1 tablet (4 mg  total) by mouth every 8 (eight) hours as needed for muscle spasms. 01/19/22   Zenia Resides, MD  fluticasone (FLONASE) 50 MCG/ACT nasal spray Place 1 spray into both nostrils daily. Patient taking differently: Place 1 spray into both nostrils daily as needed for allergies or rhinitis.  01/23/18 03/12/19  Maxwell Caul, PA-C    Family History Family History  Problem Relation Age of Onset   Hypertension Mother    Cancer Father        colon   Cancer Maternal Grandmother    Cancer Maternal Grandfather    Cancer Paternal Grandmother    Cancer Paternal Grandfather     Social History Social History   Tobacco Use   Smoking status: Former    Packs/day: 0.25    Years: 1.00    Total pack years: 0.25    Types:  Cigarettes    Quit date: 06/17/2012    Years since quitting: 9.9   Smokeless tobacco: Never  Vaping Use   Vaping Use: Never used  Substance Use Topics   Alcohol use: Yes    Comment: rarely   Drug use: Not Currently    Types: Marijuana     Allergies   Patient has no known allergies.   Review of Systems Review of Systems  Respiratory:  Positive for cough.   Neurological:  Positive for headaches.  Per HPI   Physical Exam Triage Vital Signs ED Triage Vitals  Enc Vitals Group     BP 05/10/22 1607 (!) 126/92     Pulse Rate 05/10/22 1607 (!) 103     Resp 05/10/22 1607 20     Temp 05/10/22 1607 98.9 F (37.2 C)     Temp src --      SpO2 05/10/22 1607 98 %     Weight --      Height --      Head Circumference --      Peak Flow --      Pain Score 05/10/22 1606 7     Pain Loc --      Pain Edu? --      Excl. in GC? --    No data found.  Updated Vital Signs BP (!) 126/92   Pulse (!) 103   Temp 98.9 F (37.2 C)   Resp 20   LMP 05/10/2022   SpO2 98%   Visual Acuity Right Eye Distance:   Left Eye Distance:   Bilateral Distance:    Right Eye Near:   Left Eye Near:    Bilateral Near:     Physical Exam Vitals and nursing note reviewed.  Constitutional:      Appearance: Claudia Gonzales is ill-appearing. Claudia Gonzales is not toxic-appearing.  HENT:     Head: Normocephalic and atraumatic.     Right Ear: Hearing, tympanic membrane, ear canal and external ear normal.     Left Ear: Hearing, tympanic membrane, ear canal and external ear normal.     Nose: Congestion present.     Right Sinus: Maxillary sinus tenderness present.     Left Sinus: Maxillary sinus tenderness present.     Mouth/Throat:     Lips: Pink.     Mouth: Mucous membranes are moist.     Pharynx: No posterior oropharyngeal erythema.  Eyes:     General: Lids are normal. Vision grossly intact. Gaze aligned appropriately.     Extraocular Movements: Extraocular movements intact.     Conjunctiva/sclera: Conjunctivae normal.   Cardiovascular:  Rate and Rhythm: Normal rate and regular rhythm.     Heart sounds: Normal heart sounds, S1 normal and S2 normal.  Pulmonary:     Effort: Pulmonary effort is normal. No respiratory distress.     Breath sounds: Normal breath sounds and air entry. No stridor. No wheezing, rhonchi or rales.  Abdominal:     General: Abdomen is flat.     Palpations: Abdomen is soft.     Tenderness: There is no right CVA tenderness or left CVA tenderness.  Musculoskeletal:     Cervical back: Neck supple.  Lymphadenopathy:     Cervical: Cervical adenopathy present.  Skin:    General: Skin is warm and dry.     Capillary Refill: Capillary refill takes less than 2 seconds.     Findings: No rash.  Neurological:     General: No focal deficit present.     Mental Status: Claudia Gonzales is alert and oriented to person, place, and time. Mental status is at baseline.     Cranial Nerves: No dysarthria or facial asymmetry.  Psychiatric:        Mood and Affect: Mood normal.        Speech: Speech normal.        Behavior: Behavior normal.        Thought Content: Thought content normal.        Judgment: Judgment normal.      UC Treatments / Results  Labs (all labs ordered are listed, but only abnormal results are displayed) Labs Reviewed - No data to display  EKG   Radiology No results found.  Procedures Procedures (including critical care time)  Medications Ordered in UC Medications  dexamethasone (DECADRON) injection 10 mg (has no administration in time range)    Initial Impression / Assessment and Plan / UC Course  I have reviewed the triage vital signs and the nursing notes.  Pertinent labs & imaging results that were available during my care of the patient were reviewed by me and considered in my medical decision making (see chart for details).   1. Bacterial sinusitis, acute cough Presentation is consistent with post-viral acute bacterial sinusitis as symptoms have been present for  the last 9-10 days.Will provide antibiotic coverage at this point in illness with Augmentin to be taken as directed. Deferred viral testing due to timing of symptoms. Decadron 10mg  injection IM given to reduce inflammation to the lungs contributing to cough as well as shortness of breath. Continued use of albuterol inhaler every 4-6 hours as needed for cough, shortness of breath, and wheeze. Tessalon perles every 8 hours and promethazine DM at bedtime as needed for cough. Drowsiness precautions discussed. May use mucinex OTC as needed for further symptomatic relief. No indication for imaging based on stable cardiopulmonary exam findings and hemodynamically stable vital signs.  Discussed physical exam and available lab work findings in clinic with patient.  Counseled patient regarding appropriate use of medications and potential side effects for all medications recommended or prescribed today. Discussed red flag signs and symptoms of worsening condition,when to call the PCP office, return to urgent care, and when to seek higher level of care in the emergency department. Patient verbalizes understanding and agreement with plan. All questions answered. Patient discharged in stable condition.    Final Clinical Impressions(s) / UC Diagnoses   Final diagnoses:  Bacterial sinusitis  Acute cough     Discharge Instructions      Your evaluation shows you have a bacterial sinus infection plus a  viral infection of the upper airways of your lungs. Use the following medicines to help with your symptoms:  - Take antibiotic sent to pharmacy as directed to treat sinus infection. - I gave you a steroid injection in the clinic to help with your cough. - You may use albuterol inhaler 1 to 2 puffs every 4-6 hours as needed for cough, shortness of breath, and wheezing. - Tessalon perles every 8 hours as needed for cough. - Take Promethazine DM cough medication to help with your cough at nighttime so that you are  able to sleep. Do not drive, drink alcohol, or go to work while taking this medication since it can make you sleepy. Only take this at nighttime.  - Purchase Mucinex over the counter and take this every 12 hours as needed for nasal congestion and cough.  If you develop any new or worsening symptoms or do not improve in the next 2 to 3 days, please return.  If your symptoms are severe, please go to the emergency room.  Follow-up with your primary care provider for further evaluation and management of your symptoms as well as ongoing wellness visits.  I hope you feel better!     ED Prescriptions     Medication Sig Dispense Auth. Provider   amoxicillin-clavulanate (AUGMENTIN) 875-125 MG tablet Take 1 tablet by mouth every 12 (twelve) hours. 14 tablet Carlisle Beers, FNP   promethazine-dextromethorphan (PROMETHAZINE-DM) 6.25-15 MG/5ML syrup Take 5 mLs by mouth at bedtime as needed for cough. 118 mL Reita May M, FNP   benzonatate (TESSALON) 100 MG capsule Take 1 capsule (100 mg total) by mouth every 8 (eight) hours. 21 capsule Carlisle Beers, FNP      PDMP not reviewed this encounter.   Carlisle Beers, [place] 05/11/22 2210

## 2022-08-17 ENCOUNTER — Ambulatory Visit: Payer: Medicaid Other | Admitting: Obstetrics and Gynecology

## 2022-10-23 ENCOUNTER — Emergency Department (HOSPITAL_COMMUNITY)
Admission: EM | Admit: 2022-10-23 | Discharge: 2022-10-23 | Disposition: A | Discharge status: Home / Self Care | Payer: Medicaid Other | Attending: Emergency Medicine | Admitting: Emergency Medicine

## 2022-10-23 ENCOUNTER — Encounter (HOSPITAL_COMMUNITY): Payer: Self-pay | Admitting: Emergency Medicine

## 2022-10-23 DIAGNOSIS — R000 Tachycardia, unspecified: Secondary | ICD-10-CM | POA: Diagnosis not present

## 2022-10-23 DIAGNOSIS — J029 Acute pharyngitis, unspecified: Secondary | ICD-10-CM | POA: Diagnosis present

## 2022-10-23 DIAGNOSIS — Z1152 Encounter for screening for COVID-19: Secondary | ICD-10-CM | POA: Diagnosis not present

## 2022-10-23 DIAGNOSIS — J069 Acute upper respiratory infection, unspecified: Secondary | ICD-10-CM | POA: Insufficient documentation

## 2022-10-23 LAB — SARS CORONAVIRUS 2 BY RT PCR: SARS Coronavirus 2 by RT PCR: NEGATIVE

## 2022-10-23 MED ORDER — ACETAMINOPHEN 325 MG PO TABS
650.0000 mg | ORAL_TABLET | Freq: Once | ORAL | Status: AC
  Administered 2022-10-23: 650 mg via ORAL

## 2022-10-23 MED ORDER — PREDNISONE 20 MG PO TABS: 40.0000 mg | ORAL_TABLET | Freq: Every day | ORAL | 0 refills | Status: AC

## 2022-10-23 MED ORDER — PREDNISONE 20 MG PO TABS
60.0000 mg | ORAL_TABLET | Freq: Once | ORAL | Status: AC
  Administered 2022-10-23: 60 mg via ORAL

## 2022-10-23 MED ORDER — AMOXICILLIN 500 MG PO CAPS
1000.0000 mg | ORAL_CAPSULE | Freq: Once | ORAL | Status: AC
  Administered 2022-10-23: 1000 mg via ORAL

## 2022-10-23 MED ORDER — AMOXICILLIN 500 MG PO CAPS: 1000.0000 mg | ORAL_CAPSULE | Freq: Every day | ORAL | 0 refills | Status: AC

## 2022-10-23 MED ORDER — ALBUTEROL SULFATE HFA 108 (90 BASE) MCG/ACT IN AERS
2.0000 | INHALATION_SPRAY | Freq: Once | RESPIRATORY_TRACT | Status: AC
  Administered 2022-10-23: 2 via RESPIRATORY_TRACT

## 2022-10-23 NOTE — Discharge Instructions (Signed)
Take next dose of amoxicillin and prednisone tomorrow. Continue inhaler 2-4 puffs every 4-6 hours as needed.

## 2022-10-23 NOTE — ED Provider Notes (Signed)
[place] EMERGENCY DEPARTMENT AT [facility] Provider Note   CSN: [id number] Arrival date & time: 10/23/22  4098     History  Chief Complaint  Patient presents with   URI    Claudia Gonzales is a 32 y.o. female.   URI Presenting symptoms: congestion, fever and sore throat   Severity:  Mild Onset quality:  Gradual Duration:  2 days Timing:  Constant Progression:  Unchanged Chronicity:  New Relieved by:  Nothing Worsened by:  Nothing Associated symptoms: sinus pain and wheezing   Associated symptoms: no arthralgias, no headaches, no myalgias, no neck pain, no sneezing and no swollen glands   Risk factors comment:  Asthma      Home Medications Prior to Admission medications   Medication Sig Start Date End Date Taking? Authorizing Provider  amoxicillin (AMOXIL) 500 MG capsule Take 2 capsules (1,000 mg total) by mouth daily for 9 days. 10/23/22 11/01/22 Yes Neelam Tiggs, DO  predniSONE (DELTASONE) 20 MG tablet Take 2 tablets (40 mg total) by mouth daily for 4 days. 10/23/22 10/27/22 Yes Marik Sedore, DO  amoxicillin-clavulanate (AUGMENTIN) 875-125 MG tablet Take 1 tablet by mouth every 12 (twelve) hours. 05/10/22   Carlisle Beers, FNP  benzonatate (TESSALON) 100 MG capsule Take 1 capsule (100 mg total) by mouth every 8 (eight) hours. 05/10/22   Carlisle Beers, FNP  ibuprofen (ADVIL) 800 MG tablet Take 1 tablet (800 mg total) by mouth every 8 (eight) hours as needed (pain). 01/19/22   Zenia Resides, MD  PROAIR HFA 108 [phone] Base) MCG/ACT inhaler Inhale 1 puff into the lungs every 6 (six) hours as needed for wheezing or shortness of breath. 03/28/20   [provider]  promethazine-dextromethorphan (PROMETHAZINE-DM) 6.25-15 MG/5ML syrup Take 5 mLs by mouth at bedtime as needed for cough. 05/10/22   Carlisle Beers, FNP  tiZANidine (ZANAFLEX) 4 MG tablet Take 1 tablet (4 mg total) by mouth every 8 (eight) hours as needed for muscle spasms.  01/19/22   Zenia Resides, MD  fluticasone (FLONASE) 50 MCG/ACT nasal spray Place 1 spray into both nostrils daily. Patient taking differently: Place 1 spray into both nostrils daily as needed for allergies or rhinitis.  01/23/18 03/12/19  Maxwell Caul, PA-C      Allergies    Patient has no known allergies.    Review of Systems   Review of Systems  Constitutional:  Positive for fever.  HENT:  Positive for congestion, sinus pain and sore throat. Negative for sneezing.   Respiratory:  Positive for wheezing.   Musculoskeletal:  Negative for arthralgias, myalgias and neck pain.  Neurological:  Negative for headaches.    Physical Exam Updated Vital Signs BP 138/89 (BP Location: Left Arm)   Pulse (!) 107   Temp 99.8 F (37.7 C) (Oral)   Resp 18   SpO2 99%  Physical Exam Vitals and nursing note reviewed.  Constitutional:      General: She is not in acute distress.    Appearance: She is well-developed. She is not ill-appearing.  HENT:     Head: Normocephalic and atraumatic.     Nose: Congestion present.     Mouth/Throat:     Mouth: Mucous membranes are moist.  Eyes:     Extraocular Movements: Extraocular movements intact.     Conjunctiva/sclera: Conjunctivae normal.     Pupils: Pupils are equal, round, and reactive to light.  Cardiovascular:     Rate and Rhythm: Normal rate and  regular rhythm.     Heart sounds: No murmur heard. Pulmonary:     Effort: Pulmonary effort is normal. No respiratory distress.     Breath sounds: Wheezing present.  Abdominal:     Palpations: Abdomen is soft.     Tenderness: There is no abdominal tenderness.  Musculoskeletal:        General: No swelling.     Cervical back: Normal range of motion and neck supple.  Skin:    General: Skin is warm and dry.     Capillary Refill: Capillary refill takes less than 2 seconds.  Neurological:     Mental Status: She is alert.  Psychiatric:        Mood and Affect: Mood normal.     ED Results /  Procedures / Treatments   Labs (all labs ordered are listed, but only abnormal results are displayed) Labs Reviewed  SARS CORONAVIRUS 2 BY RT PCR    EKG None  Radiology No results found.  Procedures Procedures    Medications Ordered in ED Medications  predniSONE (DELTASONE) tablet 60 mg (has no administration in time range)  amoxicillin (AMOXIL) capsule 1,000 mg (has no administration in time range)  acetaminophen (TYLENOL) tablet 650 mg (has no administration in time range)  albuterol (VENTOLIN HFA) 108 (90 Base) MCG/ACT inhaler 2 puff (has no administration in time range)    ED Course/ Medical Decision Making/ A&P                             Medical Decision Making Risk OTC drugs. Prescription drug management.   Claudia Landsberg Corral is here with fever, nasal congestoin, sore throat, asthma symptoms. Well appearing, normal vitals except for low grade fever and mild tachycardia. Mild wheezing, nasal congestion, no obvious throat infection on exam or ear infection. Covid test neg. DDX viral process likely, seems less likely to be strep but will treat with abx, steroids, inhaler. Given tylenol. Suspect mild asthma exacerbation, no resp distress. Given return precautions. D/c in good condition. Just had menstrual cycle, ok for steroids.  This chart was dictated using voice recognition software.  Despite best efforts to proofread,  errors can occur which can change the documentation meaning.         Final Clinical Impression(s) / ED Diagnoses Final diagnoses:  Upper respiratory tract infection, unspecified type    Rx / DC Orders ED Discharge Orders          Ordered    amoxicillin (AMOXIL) 500 MG capsule  Daily        10/23/22 1002    predniSONE (DELTASONE) 20 MG tablet  Daily        10/23/22 1002              Sameen Leas, DO 10/23/22 1002

## 2022-10-23 NOTE — ED Triage Notes (Signed)
2 days of uri symptoms- sore throat, cough, nasal congestion, aches. Reports she has asthma and been using inhalers but does not feel like it is helping. No respiratory distress.

## 2023-07-16 ENCOUNTER — Emergency Department (HOSPITAL_COMMUNITY)
Admission: EM | Admit: 2023-07-16 | Discharge: 2023-07-17 | Disposition: A | Discharge status: Home / Self Care | Attending: Emergency Medicine | Admitting: Emergency Medicine

## 2023-07-16 ENCOUNTER — Other Ambulatory Visit: Payer: Self-pay

## 2023-07-16 ENCOUNTER — Encounter (HOSPITAL_COMMUNITY): Payer: Self-pay | Admitting: *Deleted

## 2023-07-16 DIAGNOSIS — R519 Headache, unspecified: Secondary | ICD-10-CM | POA: Insufficient documentation

## 2023-07-16 DIAGNOSIS — R002 Palpitations: Secondary | ICD-10-CM | POA: Insufficient documentation

## 2023-07-16 DIAGNOSIS — J45909 Unspecified asthma, uncomplicated: Secondary | ICD-10-CM | POA: Insufficient documentation

## 2023-07-16 DIAGNOSIS — R110 Nausea: Secondary | ICD-10-CM | POA: Insufficient documentation

## 2023-07-16 DIAGNOSIS — R209 Unspecified disturbances of skin sensation: Secondary | ICD-10-CM | POA: Diagnosis not present

## 2023-07-16 LAB — COMPREHENSIVE METABOLIC PANEL WITH GFR
ALT: 19 U/L (ref 0–44)
AST: 20 U/L (ref 15–41)
Albumin: 3.3 g/dL — ABNORMAL LOW (ref 3.5–5.0)
Alkaline Phosphatase: 51 U/L (ref 38–126)
Anion gap: 8 (ref 5–15)
BUN: 11 mg/dL (ref 6–20)
CO2: 25 mmol/L (ref 22–32)
Calcium: 9.4 mg/dL (ref 8.9–10.3)
Chloride: 103 mmol/L (ref 98–111)
Creatinine, Ser: 0.8 mg/dL (ref 0.44–1.00)
GFR, Estimated: >60 mL/min (ref >60)
Glucose, Bld: 79 mg/dL (ref 70–99)
Potassium: 4.1 mmol/L (ref 3.5–5.1)
Sodium: 136 mmol/L (ref 135–145)
Total Bilirubin: 0.4 mg/dL (ref 0.0–1.2)
Total Protein: 7.3 g/dL (ref 6.5–8.1)

## 2023-07-16 LAB — URINALYSIS, ROUTINE W REFLEX MICROSCOPIC
Bilirubin Urine: NEGATIVE
Glucose, UA: NEGATIVE mg/dL
Hgb urine dipstick: NEGATIVE
Ketones, ur: NEGATIVE mg/dL
Nitrite: NEGATIVE
Protein, ur: NEGATIVE mg/dL
Specific Gravity, Urine: 1.023 (ref 1.005–1.030)
pH: 6 (ref 5.0–8.0)

## 2023-07-16 LAB — CBC
HCT: 34.1 % — ABNORMAL LOW (ref 36.0–46.0)
Hemoglobin: 11.2 g/dL — ABNORMAL LOW (ref 12.0–15.0)
MCH: 28.9 pg (ref 26.0–34.0)
MCHC: 32.8 g/dL (ref 30.0–36.0)
MCV: 87.9 fL (ref 80.0–100.0)
Platelets: 283 10*3/uL (ref 150–400)
RBC: 3.88 MIL/uL (ref 3.87–5.11)
RDW: 13.6 % (ref 11.5–15.5)
WBC: 8.6 10*3/uL (ref 4.0–10.5)
nRBC: 0 % (ref 0.0–0.2)

## 2023-07-16 LAB — LIPASE, BLOOD: Lipase: 30 U/L (ref 11–51)

## 2023-07-16 LAB — HCG, SERUM, QUALITATIVE: Preg, Serum: NEGATIVE

## 2023-07-16 NOTE — ED Triage Notes (Signed)
 She has anxiety also

## 2023-07-16 NOTE — ED Triage Notes (Signed)
 The pt is c/o lower back pain much gas headaches nausea lips feel numb and her heart has been racing for the past 2-3 weeks   lmp sat

## 2023-07-17 ENCOUNTER — Emergency Department (HOSPITAL_COMMUNITY)

## 2023-07-17 MED ORDER — ONDANSETRON 4 MG PO TBDP: 4.0000 mg | ORAL_TABLET | Freq: Three times a day (TID) | ORAL | 0 refills | Status: DC | PRN

## 2023-07-17 MED ORDER — ONDANSETRON 4 MG PO TBDP
4.0000 mg | ORAL_TABLET | Freq: Once | ORAL | Status: AC
  Administered 2023-07-17: 4 mg via ORAL
  Filled 2023-07-17: qty 1

## 2023-07-17 MED ORDER — KETOROLAC TROMETHAMINE 30 MG/ML IJ SOLN
30.0000 mg | Freq: Once | INTRAMUSCULAR | Status: AC
  Administered 2023-07-17: 30 mg via INTRAMUSCULAR
  Filled 2023-07-17: qty 1

## 2023-07-17 NOTE — ED Provider Notes (Signed)
 Emergency Department Provider Note   I have reviewed the triage vital signs and the nursing notes.   HISTORY  Chief Complaint No chief complaint on file.   HPI Claudia Gonzales is a 33 y.o. female with past history reviewed below presents emergency department with headache, perioral tingling, nausea.  Symptoms have been present for the past several days but worsening over the past 2 days. No fever. No unilateral weakness or numbness. No CP. Patient recalls a fairly abrupt onset HA without lingering throbbing HA. Symptoms have since improved.    Past Medical History:  Diagnosis Date   Anemia    Anxiety    Asthma    Chlamydia    Depression    Polyarthralgia 04/20/2018   Preterm labor 2011    Review of Systems  Constitutional: No fever/chills Cardiovascular: Denies chest pain. Respiratory: Denies shortness of breath. Gastrointestinal: No abdominal pain. Positive nausea, no vomiting.   Skin: Negative for rash. Neurological:  Positive HA and perioral numbness.   ____________________________________________   PHYSICAL EXAM:  VITAL SIGNS: ED Triage Vitals  Encounter Vitals Group     BP 07/16/23 1850 (!) 127/95     Pulse Rate 07/16/23 1850 83     Resp 07/16/23 1850 18     Temp 07/16/23 1850 98.7 F (37.1 C)     Temp Source 07/16/23 2341 Oral     SpO2 07/16/23 1850 99 %     Weight 07/16/23 1856 274 lb 14.6 oz (124.7 kg)     Height 07/16/23 1856 5\' 7"  (1.702 m)   Constitutional: Alert and oriented. Well appearing and in no acute distress. Eyes: Conjunctivae are normal.  Head: Atraumatic. Nose: No congestion/rhinnorhea. Mouth/Throat: Mucous membranes are moist. Neck: No stridor.   Cardiovascular: Normal rate, regular rhythm. Good peripheral circulation. Grossly normal heart sounds.   Respiratory: Normal respiratory effort.  No retractions. Lungs CTAB. Gastrointestinal: Soft and nontender. No distention.  Musculoskeletal: No gross deformities of  extremities. Neurologic:  Normal speech and language.  Skin:  Skin is warm, dry and intact. No rash noted.  ____________________________________________   LABS (all labs ordered are listed, but only abnormal results are displayed)  Labs Reviewed  COMPREHENSIVE METABOLIC PANEL WITH GFR - Abnormal; Notable for the following components:      Result Value   Albumin 3.3 (*)    All other components within normal limits  CBC - Abnormal; Notable for the following components:   Hemoglobin 11.2 (*)    HCT 34.1 (*)    All other components within normal limits  URINALYSIS, ROUTINE W REFLEX MICROSCOPIC - Abnormal; Notable for the following components:   APPearance HAZY (*)    Leukocytes,Ua TRACE (*)    Bacteria, UA RARE (*)    All other components within normal limits  LIPASE, BLOOD  HCG, SERUM, QUALITATIVE   ____________________________________________  RADIOLOGY  CT Head Wo Contrast Result Date: 07/17/2023 CLINICAL DATA:  Initial evaluation for acute headache. EXAM: CT HEAD WITHOUT CONTRAST TECHNIQUE: Contiguous axial images were obtained from the base of the skull through the vertex without intravenous contrast. RADIATION DOSE REDUCTION: This exam was performed according to the departmental dose-optimization program which includes automated exposure control, adjustment of the mA and/or kV according to patient size and/or use of iterative reconstruction technique. COMPARISON:  None Available. FINDINGS: Brain: Cerebral volume within normal limits for patient age. No acute intracranial hemorrhage. No acute large vessel territory infarct. No mass lesion, midline shift, or mass effect. Ventricles are normal in size  without hydrocephalus. No extra-axial fluid collection. Vascular: No abnormal hyperdense vessel. Skull: Scalp soft tissues demonstrate no acute abnormality. Calvarium intact. Sinuses/Orbits: Globes and orbital soft tissues within normal limits. Scattered mucosal thickening noted about the  ethmoidal air cells and maxillary sinuses. Superimposed small right maxillary sinus retention cyst. No significant mastoid effusion. IMPRESSION: Normal head CT. No acute intracranial abnormality. Electronically Signed   By: Virgia Griffins M.D.   On: 07/17/2023 03:56    ____________________________________________   PROCEDURES  Procedure(s) performed:   Procedures  None  ____________________________________________   INITIAL IMPRESSION / ASSESSMENT AND PLAN / ED COURSE  Pertinent labs & imaging results that were available during my care of the patient were reviewed by me and considered in my medical decision making (see chart for details).   This patient is Presenting for Evaluation of HA, which does require a range of treatment options, and is a complaint that involves a high risk of morbidity and mortality.  The Differential Diagnoses includes but is not exclusive to subarachnoid hemorrhage, meningitis, encephalitis, previous head trauma, cavernous venous thrombosis, muscle tension headache, glaucoma, temporal arteritis, migraine or migraine equivalent, etc.   Critical Interventions-    Medications  ondansetron (ZOFRAN-ODT) disintegrating tablet 4 mg (4 mg Oral Given 07/17/23 0457)  ketorolac (TORADOL) 30 MG/ML injection 30 mg (30 mg Intramuscular Given 07/17/23 0455)    Reassessment after intervention: pain improved.   Clinical Laboratory Tests Ordered, included CBC without leukocytosis. CMP with normal LFTs. Normal lipase. Pregnancy negative.   Radiologic Tests Ordered, included CT head. I independently interpreted the images and agree with radiology interpretation.   Medical Decision Making: Summary:  Patient presents to the ED HA.  Symptoms progressing over the past couple of days with report of fairly sudden onset headache symptoms.  Plan for CT head in that setting along with screening blood work, EKG and reassess.  Reevaluation with update and discussion with  patient's headache symptoms mostly resolved.  Plan for Toradol and Zofran.  Plan for discharge home.  CT head negative.  Discussed this with patient who is comfortable with plan of discharge.  Patient's presentation is most consistent with acute presentation with potential threat to life or bodily function.   Disposition: discharge  ____________________________________________  FINAL CLINICAL IMPRESSION(S) / ED DIAGNOSES  Final diagnoses:  Acute nonintractable headache, unspecified headache type  Palpitations  Nausea    Note:  This document was prepared using Dragon voice recognition software and may include unintentional dictation errors.  Abby Hocking, MD, [facility]    Suanne Minahan, Shereen Dike, MD 07/17/23 [phone]

## 2023-07-17 NOTE — Discharge Instructions (Signed)

## 2023-07-17 NOTE — ED Notes (Signed)
To ct and back

## 2023-07-17 NOTE — ED Notes (Signed)
 This RN reviewed discharge instructions with patient. She verbalized understanding and denied any further questions. PT well appearing upon discharge and reports tolerable pain. Pt ambulated with stable gait to exit. Pt endorses ride home.

## 2023-11-24 ENCOUNTER — Emergency Department (HOSPITAL_COMMUNITY)
Admission: EM | Admit: 2023-11-24 | Discharge: 2023-11-24 | Disposition: A | Discharge status: Home / Self Care | Attending: Emergency Medicine | Admitting: Emergency Medicine

## 2023-11-24 ENCOUNTER — Encounter (HOSPITAL_COMMUNITY): Payer: Self-pay

## 2023-11-24 ENCOUNTER — Other Ambulatory Visit: Payer: Self-pay

## 2023-11-24 ENCOUNTER — Emergency Department (HOSPITAL_COMMUNITY)

## 2023-11-24 DIAGNOSIS — R0789 Other chest pain: Secondary | ICD-10-CM | POA: Diagnosis not present

## 2023-11-24 DIAGNOSIS — M791 Myalgia, unspecified site: Secondary | ICD-10-CM | POA: Diagnosis not present

## 2023-11-24 DIAGNOSIS — Z7951 Long term (current) use of inhaled steroids: Secondary | ICD-10-CM | POA: Diagnosis not present

## 2023-11-24 DIAGNOSIS — R079 Chest pain, unspecified: Secondary | ICD-10-CM | POA: Diagnosis present

## 2023-11-24 DIAGNOSIS — R112 Nausea with vomiting, unspecified: Secondary | ICD-10-CM | POA: Insufficient documentation

## 2023-11-24 DIAGNOSIS — J45909 Unspecified asthma, uncomplicated: Secondary | ICD-10-CM | POA: Insufficient documentation

## 2023-11-24 LAB — CBC
HCT: 33 % — ABNORMAL LOW (ref 36.0–46.0)
Hemoglobin: 10.9 g/dL — ABNORMAL LOW (ref 12.0–15.0)
MCH: 28.2 pg (ref 26.0–34.0)
MCHC: 33 g/dL (ref 30.0–36.0)
MCV: 85.5 fL (ref 80.0–100.0)
Platelets: 267 K/uL (ref 150–400)
RBC: 3.86 MIL/uL — ABNORMAL LOW (ref 3.87–5.11)
RDW: 13.3 % (ref 11.5–15.5)
WBC: 6.8 K/uL (ref 4.0–10.5)
nRBC: 0 % (ref 0.0–0.2)

## 2023-11-24 LAB — BASIC METABOLIC PANEL WITH GFR
Anion gap: 10 (ref 5–15)
BUN: 10 mg/dL (ref 6–20)
CO2: 23 mmol/L (ref 22–32)
Calcium: 9 mg/dL (ref 8.9–10.3)
Chloride: 101 mmol/L (ref 98–111)
Creatinine, Ser: 0.66 mg/dL (ref 0.44–1.00)
GFR, Estimated: >60 mL/min (ref >60)
Glucose, Bld: 105 mg/dL — ABNORMAL HIGH (ref 70–99)
Potassium: 3.9 mmol/L (ref 3.5–5.1)
Sodium: 134 mmol/L — ABNORMAL LOW (ref 135–145)

## 2023-11-24 LAB — RESP PANEL BY RT-PCR (RSV, FLU A&B, COVID)  RVPGX2
Influenza A by PCR: NEGATIVE
Influenza B by PCR: NEGATIVE
Resp Syncytial Virus by PCR: NEGATIVE
SARS Coronavirus 2 by RT PCR: NEGATIVE

## 2023-11-24 LAB — HCG, SERUM, QUALITATIVE: Preg, Serum: NEGATIVE

## 2023-11-24 LAB — TROPONIN I (HIGH SENSITIVITY): Troponin I (High Sensitivity): 3 ng/L (ref <18)

## 2023-11-24 MED ORDER — KETOROLAC TROMETHAMINE 15 MG/ML IJ SOLN
15.0000 mg | Freq: Once | INTRAMUSCULAR | Status: AC
  Administered 2023-11-24: 15 mg via INTRAVENOUS

## 2023-11-24 MED ORDER — SODIUM CHLORIDE 0.9 % IV BOLUS
1000.0000 mL | Freq: Once | INTRAVENOUS | Status: AC
  Administered 2023-11-24: 1000 mL via INTRAVENOUS

## 2023-11-24 NOTE — Discharge Instructions (Addendum)
 Your workup was reassuring.  No concerning cause of your chest pain.  Given the onset of bodyaches, nausea and vomiting this could be an upper respiratory infection however your COVID and flu test were negative.  Take Tylenol , ibuprofen .  Drink plenty of fluids.  Follow-up with your primary care doctor.  Return for any emergent symptoms.

## 2023-11-24 NOTE — ED Triage Notes (Signed)
 Pt arrived from home via POV c/o mid sternal chest pain described as a bubble that she is unable to breathe around 10/10 on pain scale and relived by nothing

## 2023-11-24 NOTE — ED Provider Notes (Signed)
 [place] EMERGENCY DEPARTMENT AT [facility] Provider Note   CSN: [id number] Arrival date & time: 11/24/23  9460     Patient presents with: Chest Pain   Claudia Gonzales is a 33 y.o. female.   33 year old female presents today for concern of chest pain.  In addition to the chest pain she also endorses generalized bodyaches, nausea, vomiting.  Denies any fever or cough.  No known sick contacts.  No prior history of CAD.  No associated shortness of breath.  Pain does not radiate anywhere.  No significant family history of CAD.  The history is provided by the patient. No language interpreter was used.       Prior to Admission medications   Medication Sig Start Date End Date Taking? Authorizing Provider  amoxicillin -clavulanate (AUGMENTIN ) 875-125 MG tablet Take 1 tablet by mouth every 12 (twelve) hours. 05/10/22   Enedelia Dorna HERO, FNP  benzonatate  (TESSALON ) 100 MG capsule Take 1 capsule (100 mg total) by mouth every 8 (eight) hours. 05/10/22   Enedelia Dorna HERO, FNP  ibuprofen  (ADVIL ) 800 MG tablet Take 1 tablet (800 mg total) by mouth every 8 (eight) hours as needed (pain). 01/19/22   Vonna Sharlet POUR, MD  ondansetron  (ZOFRAN -ODT) 4 MG disintegrating tablet Take 1 tablet (4 mg total) by mouth every 8 (eight) hours as needed. 07/17/23   Long, Fonda MATSU, MD  PROAIR  HFA 108 (90 Base) MCG/ACT inhaler Inhale 1 puff into the lungs every 6 (six) hours as needed for wheezing or shortness of breath. 03/28/20   [provider]  promethazine -dextromethorphan (PROMETHAZINE -DM) 6.25-15 MG/5ML syrup Take 5 mLs by mouth at bedtime as needed for cough. 05/10/22   Enedelia Dorna HERO, FNP  tiZANidine  (ZANAFLEX ) 4 MG tablet Take 1 tablet (4 mg total) by mouth every 8 (eight) hours as needed for muscle spasms. 01/19/22   Banister, Pamela K, MD  fluticasone  (FLONASE ) 50 MCG/ACT nasal spray Place 1 spray into both nostrils daily. Patient taking differently: Place 1 spray into  both nostrils daily as needed for allergies or rhinitis.  01/23/18 03/12/19  Layden, Lindsey A, PA-C    Allergies: Patient has no known allergies.    Review of Systems  Constitutional:  Negative for chills and fever.  Respiratory:  Negative for shortness of breath.   Cardiovascular:  Positive for chest pain.  Gastrointestinal:  Positive for nausea and vomiting. Negative for abdominal pain.  Musculoskeletal:  Positive for myalgias.  Neurological:  Negative for light-headedness.  All other systems reviewed and are negative.   Updated Vital Signs BP 114/68   Pulse 73   Temp 98.3 F (36.8 C) (Oral)   Resp 18   Ht 5' 7 (1.702 m)   Wt 131.5 kg   LMP 11/10/2023 (Approximate)   SpO2 100%   BMI 45.42 kg/m   Physical Exam Vitals and nursing note reviewed.  Constitutional:      General: She is not in acute distress.    Appearance: Normal appearance. She is not ill-appearing.  HENT:     Head: Normocephalic and atraumatic.     Nose: Nose normal.  Eyes:     Conjunctiva/sclera: Conjunctivae normal.  Cardiovascular:     Rate and Rhythm: Normal rate and regular rhythm.  Pulmonary:     Effort: Pulmonary effort is normal. No respiratory distress.  Abdominal:     General: There is no distension.     Palpations: Abdomen is soft.     Tenderness: There is no abdominal tenderness. There  is no guarding.  Musculoskeletal:        General: No deformity. Normal range of motion.     Cervical back: Normal range of motion.  Skin:    Findings: No rash.  Neurological:     Mental Status: She is alert.     (all labs ordered are listed, but only abnormal results are displayed) Labs Reviewed  BASIC METABOLIC PANEL WITH GFR - Abnormal; Notable for the following components:      Result Value   Sodium 134 (*)    Glucose, Bld 105 (*)    All other components within normal limits  CBC - Abnormal; Notable for the following components:   RBC 3.86 (*)    Hemoglobin 10.9 (*)    HCT 33.0 (*)    All  other components within normal limits  RESP PANEL BY RT-PCR (RSV, FLU A&B, COVID)  RVPGX2  HCG, SERUM, QUALITATIVE  TROPONIN I (HIGH SENSITIVITY)    EKG: EKG Interpretation Date/Time:  Wednesday November 24 2023 05:49:01 EDT Ventricular Rate:  76 PR Interval:  160 QRS Duration:  78 QT Interval:  388 QTC Calculation: 436 R Axis:   -1  Text Interpretation: Normal sinus rhythm Cannot rule out Anterior infarct , age undetermined Abnormal ECG When compared with ECG of 17-Jul-2023 03:05, PREVIOUS ECG IS PRESENT Confirmed by Pamella Sharper [phone]) on 11/24/2023 7:42:51 AM  Radiology: DG Chest 2 View Result Date: 11/24/2023 EXAM: PA AND LATERAL (2 VIEWS) XRAY OF THE CHEST 11/24/2023 06:19:00 AM COMPARISON: 01/31/2022 CLINICAL HISTORY: Chest pain. Mid sternal chest pain. FINDINGS: LUNGS AND PLEURA: No focal pulmonary opacity. No pulmonary edema. No pleural effusion. No pneumothorax. HEART AND MEDIASTINUM: No acute abnormality of the cardiac and mediastinal silhouettes. BONES AND SOFT TISSUES: No acute osseous abnormality. IMPRESSION: 1. No acute cardiopulmonary pathology. Electronically signed by: Evalene Coho MD 11/24/2023 06:58 AM EDT RP Workstation: [id number]     Procedures   Medications Ordered in the ED  sodium chloride  0.9 % bolus 1,000 mL (1,000 mLs Intravenous New Bag/Given 11/24/23 0729)  ketorolac  (TORADOL ) 15 MG/ML injection 15 mg (15 mg Intravenous Given 11/24/23 0729)                                    Medical Decision Making Amount and/or Complexity of Data Reviewed Labs: ordered. Radiology: ordered.  Risk Prescription drug management.   Medical Decision Making / ED Course   This patient presents to the ED for concern of chest pain, myalgias, this involves an extensive number of treatment options, and is a complaint that carries with it a high risk of complications and morbidity.  The differential diagnosis includes ACS, PE, pneumonia, viral URI  MDM: 33 year old  female presents today for concern of chest pain but also has myalgias, nausea and vomiting.  Denies any fever.  No previous history of CAD.  No significant family history of CAD.  Low heart score.  CBC without leukocytosis, hemoglobin 10.9 but around her baseline.  BMP without acute concern.  Troponin negative.  Respiratory panel negative.  Chest x-ray without acute cardiopulmonary process.  EKG without acute ischemic change.  Low suspicion for ACS. Likely viral URI that is early in its course.  Discussed follow-up with PCP. Improved after Toradol  shot.  Patient discharged in stable condition.  Return precaution discussed.  Patient voices understanding and is in agreement with the plan.  Lab Tests: -I ordered, reviewed, and interpreted labs.  The pertinent results include:   Labs Reviewed  BASIC METABOLIC PANEL WITH GFR - Abnormal; Notable for the following components:      Result Value   Sodium 134 (*)    Glucose, Bld 105 (*)    All other components within normal limits  CBC - Abnormal; Notable for the following components:   RBC 3.86 (*)    Hemoglobin 10.9 (*)    HCT 33.0 (*)    All other components within normal limits  RESP PANEL BY RT-PCR (RSV, FLU A&B, COVID)  RVPGX2  HCG, SERUM, QUALITATIVE  TROPONIN I (HIGH SENSITIVITY)      EKG  EKG Interpretation Date/Time:  Wednesday November 24 2023 05:49:01 EDT Ventricular Rate:  76 PR Interval:  160 QRS Duration:  78 QT Interval:  388 QTC Calculation: 436 R Axis:   -1  Text Interpretation: Normal sinus rhythm Cannot rule out Anterior infarct , age undetermined Abnormal ECG When compared with ECG of 17-Jul-2023 03:05, PREVIOUS ECG IS PRESENT Confirmed by Pamella Sharper [phone] on 11/24/2023 7:42:51 AM         Imaging Studies ordered: I ordered imaging studies including chest x-ray I independently visualized and interpreted imaging. I agree with the radiologist interpretation   Medicines ordered and prescription drug  management: Meds ordered this encounter  Medications   sodium chloride  0.9 % bolus 1,000 mL   ketorolac  (TORADOL ) 15 MG/ML injection 15 mg    -I have reviewed the patients home medicines and have made adjustments as needed  Cardiac Monitoring: The patient was maintained on a cardiac monitor.  I personally viewed and interpreted the cardiac monitored which showed an underlying rhythm of: Normal sinus rhythm  Reevaluation: After the interventions noted above, I reevaluated the patient and found that they have :improved  Co morbidities that complicate the patient evaluation  Past Medical History:  Diagnosis Date   Anemia    Anxiety    Asthma    Chlamydia    Depression    Polyarthralgia 04/20/2018   Preterm labor 2011      Dispostion: Discharged in stable condition.  Return precaution discussed.  Patient voices understanding and is in agreement with plan.  Final diagnoses:  Atypical chest pain    ED Discharge Orders     None          Hildegard Loge, PA-C 11/24/23 [id number]    Pamella Sharper LABOR, DO 12/01/23 1558

## 2023-12-02 ENCOUNTER — Encounter: Admitting: Certified Nurse Midwife

## 2023-12-02 DIAGNOSIS — Z3009 Encounter for other general counseling and advice on contraception: Secondary | ICD-10-CM

## 2024-03-01 ENCOUNTER — Encounter (HOSPITAL_COMMUNITY): Payer: Self-pay | Admitting: Emergency Medicine

## 2024-03-01 ENCOUNTER — Emergency Department (HOSPITAL_COMMUNITY)
Admission: EM | Admit: 2024-03-01 | Discharge: 2024-03-02 | Disposition: A | Discharge status: Home / Self Care | Attending: Emergency Medicine | Admitting: Emergency Medicine

## 2024-03-01 ENCOUNTER — Other Ambulatory Visit: Payer: Self-pay

## 2024-03-01 DIAGNOSIS — B349 Viral infection, unspecified: Secondary | ICD-10-CM | POA: Diagnosis not present

## 2024-03-01 DIAGNOSIS — E871 Hypo-osmolality and hyponatremia: Secondary | ICD-10-CM | POA: Diagnosis not present

## 2024-03-01 DIAGNOSIS — R509 Fever, unspecified: Secondary | ICD-10-CM | POA: Diagnosis present

## 2024-03-01 LAB — URINALYSIS, ROUTINE W REFLEX MICROSCOPIC
Bilirubin Urine: NEGATIVE
Glucose, UA: NEGATIVE mg/dL
Ketones, ur: NEGATIVE mg/dL
Leukocytes,Ua: NEGATIVE
Nitrite: NEGATIVE
Protein, ur: NEGATIVE mg/dL
Specific Gravity, Urine: 1.02 (ref 1.005–1.030)
pH: 7 (ref 5.0–8.0)

## 2024-03-01 LAB — CBC WITH DIFFERENTIAL/PLATELET
Abs Immature Granulocytes: 0.01 K/uL (ref 0.00–0.07)
Basophils Absolute: 0 K/uL (ref 0.0–0.1)
Basophils Relative: 0 %
Eosinophils Absolute: 0.1 K/uL (ref 0.0–0.5)
Eosinophils Relative: 1 %
HCT: 35.6 % — ABNORMAL LOW (ref 36.0–46.0)
Hemoglobin: 11.6 g/dL — ABNORMAL LOW (ref 12.0–15.0)
Immature Granulocytes: 0 %
Lymphocytes Relative: 20 %
Lymphs Abs: 1.4 K/uL (ref 0.7–4.0)
MCH: 27.8 pg (ref 26.0–34.0)
MCHC: 32.6 g/dL (ref 30.0–36.0)
MCV: 85.2 fL (ref 80.0–100.0)
Monocytes Absolute: 0.3 K/uL (ref 0.1–1.0)
Monocytes Relative: 4 %
Neutro Abs: 5.3 K/uL (ref 1.7–7.7)
Neutrophils Relative %: 75 %
Platelets: 188 K/uL (ref 150–400)
RBC: 4.18 MIL/uL (ref 3.87–5.11)
RDW: 14.2 % (ref 11.5–15.5)
WBC: 7.1 K/uL (ref 4.0–10.5)
nRBC: 0 % (ref 0.0–0.2)

## 2024-03-01 LAB — COMPREHENSIVE METABOLIC PANEL WITH GFR
ALT: 22 U/L (ref 0–44)
AST: 23 U/L (ref 15–41)
Albumin: 3.5 g/dL (ref 3.5–5.0)
Alkaline Phosphatase: 63 U/L (ref 38–126)
Anion gap: 9 (ref 5–15)
BUN: 12 mg/dL (ref 6–20)
CO2: 21 mmol/L — ABNORMAL LOW (ref 22–32)
Calcium: 8.8 mg/dL — ABNORMAL LOW (ref 8.9–10.3)
Chloride: 103 mmol/L (ref 98–111)
Creatinine, Ser: 0.81 mg/dL (ref 0.44–1.00)
GFR, Estimated: >60 mL/min (ref >60)
Glucose, Bld: 99 mg/dL (ref 70–99)
Potassium: 4.2 mmol/L (ref 3.5–5.1)
Sodium: 133 mmol/L — ABNORMAL LOW (ref 135–145)
Total Bilirubin: 0.5 mg/dL (ref 0.0–1.2)
Total Protein: 7.6 g/dL (ref 6.5–8.1)

## 2024-03-01 LAB — URINALYSIS, MICROSCOPIC (REFLEX)

## 2024-03-01 LAB — HCG, SERUM, QUALITATIVE: Preg, Serum: NEGATIVE

## 2024-03-01 MED ORDER — ACETAMINOPHEN 325 MG PO TABS
650.0000 mg | ORAL_TABLET | Freq: Once | ORAL | Status: AC | PRN
  Administered 2024-03-01: 650 mg via ORAL
  Filled 2024-03-01: qty 2

## 2024-03-01 NOTE — ED Triage Notes (Signed)
 Pt states she woke up today feeling fatigue, body aches, nausea, vomiting and weak.

## 2024-03-02 LAB — RESP PANEL BY RT-PCR (RSV, FLU A&B, COVID)  RVPGX2
Influenza A by PCR: NEGATIVE
Influenza B by PCR: NEGATIVE
Resp Syncytial Virus by PCR: NEGATIVE
SARS Coronavirus 2 by RT PCR: NEGATIVE

## 2024-03-02 MED ORDER — ALUM & MAG HYDROXIDE-SIMETH 200-200-20 MG/5ML PO SUSP
30.0000 mL | Freq: Once | ORAL | Status: AC
  Administered 2024-03-02: 30 mL via ORAL
  Filled 2024-03-02: qty 30

## 2024-03-02 MED ORDER — IBUPROFEN 800 MG PO TABS
800.0000 mg | ORAL_TABLET | Freq: Once | ORAL | Status: AC
  Administered 2024-03-02: 800 mg via ORAL
  Filled 2024-03-02: qty 1

## 2024-03-02 NOTE — ED Provider Notes (Signed)
 [facility] Emergency Department Provider Note MRN:  [id number]  Arrival date & time: 03/02/24     Chief Complaint   Fatigue   History of Present Illness   Claudia Gonzales is a 33 y.o. year-old female presents to the ED with chief complaint of fevers, chills, generalized bodyaches.  States that she has had some indigestion as well.  States that symptoms all started this morning.  She also reports headache.  Has not had any relief of symptoms with taking Tylenol .  States that she was around a client that was having a cough.  Denies any other known sick contacts.  Denies any dysuria or hematuria.  Denies any other associated symptoms.  History provided by patient.   Review of Systems  Pertinent positive and negative review of systems noted in HPI.    Physical Exam   Vitals:   03/01/24 2351 03/02/24 0129  BP: (!) 135/95   Pulse: 93   Resp: 18   Temp:    SpO2: 99% 98%    CONSTITUTIONAL:  non toxic-appearing, NAD NEURO:  Alert and oriented x 3, CN 3-12 grossly intact EYES:  eyes equal and reactive ENT/NECK:  Supple, no stridor  CARDIO:  normal rate, regular rhythm, appears well-perfused  PULM:  No respiratory distress, CTAB GI/GU:  non-distended, generalized abdominal discomfort MSK/SPINE:  No gross deformities, no edema, moves all extremities  SKIN:  no rash, atraumatic   *Additional and/or pertinent findings included in MDM below  Diagnostic and Interventional Summary    EKG Interpretation Date/Time:    Ventricular Rate:    PR Interval:    QRS Duration:    QT Interval:    QTC Calculation:   R Axis:      Text Interpretation:         Labs Reviewed  CBC WITH DIFFERENTIAL/PLATELET - Abnormal; Notable for the following components:      Result Value   Hemoglobin 11.6 (*)    HCT 35.6 (*)    All other components within normal limits  COMPREHENSIVE METABOLIC PANEL WITH GFR - Abnormal; Notable for the following components:   Sodium 133  (*)    CO2 21 (*)    Calcium  8.8 (*)    All other components within normal limits  URINALYSIS, ROUTINE W REFLEX MICROSCOPIC - Abnormal; Notable for the following components:   Hgb urine dipstick TRACE (*)    All other components within normal limits  URINALYSIS, MICROSCOPIC (REFLEX) - Abnormal; Notable for the following components:   Bacteria, UA RARE (*)    All other components within normal limits  RESP PANEL BY RT-PCR (RSV, FLU A&B, COVID)  [id number]  HCG, SERUM, QUALITATIVE    No orders to display    Medications  acetaminophen  (TYLENOL ) tablet 650 mg (650 mg Oral Given 03/01/24 2140)  ibuprofen  (ADVIL ) tablet 800 mg (800 mg Oral Given 03/02/24 0104)  alum & mag hydroxide-simeth (MAALOX/MYLANTA) 200-200-20 MG/5ML suspension 30 mL (30 mLs Oral Given 03/02/24 0103)     Procedures  /  Critical Care Procedures  ED Course and Medical Decision Making  I have reviewed the triage vital signs, the nursing notes, and pertinent available records from the EMR.  Social Determinants Affecting Complexity of Care: Patient has no clinically significant social determinants affecting this chief complaint..   ED Course:    Medical Decision Making Patient here with generalized bodyaches, fever, chills, and some indigestion.  She looks well on my exam.  She has some generalized abdominal discomfort,  but without focal tenderness, rebound, or guarding.  I have low suspicion for acute abdomen.  Urinalysis is unremarkable for infection.  Pregnancy test negative.  COVID and flu are negative.  No significant leukocytosis.  Uncertain etiology of the patient's symptoms, but it does seem to be viral in origin at this point.  I have advised her to return for new or worsening symptoms.  Until then, recommend watchful waiting and OTC medications and supportive measures.  Amount and/or Complexity of Data Reviewed Labs: ordered.  Risk OTC drugs. Prescription drug management.         Consultants: No  consultations were needed in caring for this patient.   Treatment and Plan: Emergency department workup does not suggest an emergent condition requiring admission or immediate intervention beyond  what has been performed at this time. The patient is safe for discharge and has  been instructed to return immediately for worsening symptoms, change in  symptoms or any other concerns    Final Clinical Impressions(s) / ED Diagnoses     ICD-10-CM   1. Viral syndrome  B34.9       ED Discharge Orders     None         Discharge Instructions Discussed with and Provided to Patient:   Discharge Instructions   None      Vicky Charleston, PA-C 03/02/24 0216    Mesner, Selinda, MD 03/02/24 [phone]

## 2024-03-06 ENCOUNTER — Encounter (HOSPITAL_COMMUNITY): Payer: Self-pay

## 2024-03-06 ENCOUNTER — Other Ambulatory Visit: Payer: Self-pay

## 2024-03-06 ENCOUNTER — Ambulatory Visit (HOSPITAL_COMMUNITY)
Admission: RE | Admit: 2024-03-06 | Discharge: 2024-03-06 | Disposition: A | Discharge status: Home / Self Care | Source: Ambulatory Visit | Attending: Nurse Practitioner

## 2024-03-06 VITALS — BP 118/79 | HR 72 | Temp 99.1°F | Resp 20

## 2024-03-06 DIAGNOSIS — R300 Dysuria: Secondary | ICD-10-CM | POA: Diagnosis not present

## 2024-03-06 DIAGNOSIS — N898 Other specified noninflammatory disorders of vagina: Secondary | ICD-10-CM | POA: Insufficient documentation

## 2024-03-06 LAB — POCT URINALYSIS DIP (MANUAL ENTRY)
Bilirubin, UA: NEGATIVE
Glucose, UA: NEGATIVE mg/dL
Ketones, POC UA: NEGATIVE mg/dL
Leukocytes, UA: NEGATIVE
Nitrite, UA: NEGATIVE
Protein Ur, POC: NEGATIVE mg/dL
Spec Grav, UA: 1.02 (ref 1.010–1.025)
Urobilinogen, UA: 0.2 U/dL
pH, UA: 6 (ref 5.0–8.0)

## 2024-03-06 LAB — POCT URINE PREGNANCY: Preg Test, Ur: NEGATIVE

## 2024-03-06 MED ORDER — FLUCONAZOLE 150 MG PO TABS: 150.0000 mg | ORAL_TABLET | Freq: Once | ORAL | 0 refills | Status: AC

## 2024-03-06 NOTE — ED Provider Notes (Signed)
 [facility]    CSN: [id number] Arrival date & time: 03/06/24  1421      History   Chief Complaint Chief Complaint  Patient presents with   Urinary Frequency    May have uti or yeast infection - Entered by patient    HPI Claudia Gonzales is a 33 y.o. female.   Patient presents today for 1 day history of burning, itching, vaginal irritation that began yesterday after sexual intercourse.  Reports pain is at the very end of urination.  No new urinary frequency or urgency, foul urinary odor, hematuria, abdominal pain, back or flank pain, fever, and nausea/vomiting.  No new vaginal discharge, vaginal rashes, sores, or lesions, or groin swelling.  No known exposures to STI.  Reports she has had the same partner for 5 years but is requesting STI testing today.  Used a boric acid suppository last night.    Past Medical History:  Diagnosis Date   Anemia    Anxiety    Asthma    Chlamydia    Depression    Polyarthralgia 04/20/2018   Preterm labor 2011    Patient Active Problem List   Diagnosis Date Noted   Bacterial vaginosis 04/22/2018   DGI (disseminated gonococcal infection) (HCC) 04/21/2018   Polyarthralgia 04/20/2018   Encounter for insertion of Mirena  IUD 10/03/2012   Traumatic injury during pregnancy 07/21/2012   Supervision of high risk pregnancy in third trimester 06/13/2012   H/O premature delivery 05/02/2012   Anxiety and depression 05/02/2012    Past Surgical History:  Procedure Laterality Date   APPENDECTOMY     WISDOM TOOTH EXTRACTION      OB History     Gravida  3   Para  2   Term  1   Preterm  1   AB      Living  2      SAB      IAB      Ectopic      Multiple      Live Births  2            Home Medications    Prior to Admission medications   Medication Sig Start Date End Date Taking? Authorizing Provider  fluconazole  (DIFLUCAN ) 150 MG tablet Take 1 tablet (150 mg total) by mouth once for 1 dose. 03/06/24 03/06/24  Yes Chandra Harlene LABOR, NP  ibuprofen  (ADVIL ) 800 MG tablet Take 1 tablet (800 mg total) by mouth every 8 (eight) hours as needed (pain). 01/19/22   Vonna Sharlet POUR, MD  PROAIR  HFA 108 (90 Base) MCG/ACT inhaler Inhale 1 puff into the lungs every 6 (six) hours as needed for wheezing or shortness of breath. 03/28/20   [provider]  fluticasone  (FLONASE ) 50 MCG/ACT nasal spray Place 1 spray into both nostrils daily. Patient taking differently: Place 1 spray into both nostrils daily as needed for allergies or rhinitis.  01/23/18 03/12/19  Delorise Morna LABOR, PA-C    Family History Family History  Problem Relation Age of Onset   Hypertension Mother    Cancer Father        colon   Cancer Maternal Grandmother    Cancer Maternal Grandfather    Cancer Paternal Grandmother    Cancer Paternal Grandfather     Social History Social History   Tobacco Use   Smoking status: Former    Current packs/day: 0.00    Average packs/day: 0.3 packs/day for 1 year (0.3 ttl pk-yrs)  Types: Cigarettes    Start date: 06/18/2011    Quit date: 06/17/2012    Years since quitting: 11.7   Smokeless tobacco: Never  Vaping Use   Vaping status: Never Used  Substance Use Topics   Alcohol use: Yes    Comment: rarely   Drug use: Not Currently    Types: Marijuana     Allergies   Patient has no known allergies.   Review of Systems Review of Systems Per HPI  Physical Exam Triage Vital Signs ED Triage Vitals  Encounter Vitals Group     BP 03/06/24 1438 118/79     Girls Systolic BP Percentile --      Girls Diastolic BP Percentile --      Boys Systolic BP Percentile --      Boys Diastolic BP Percentile --      Pulse Rate 03/06/24 1438 72     Resp 03/06/24 1438 20     Temp 03/06/24 1438 99.1 F (37.3 C)     Temp Source 03/06/24 1438 Oral     SpO2 03/06/24 1438 97 %     Weight --      Height --      Head Circumference --      Peak Flow --      Pain Score 03/06/24 1434 6     Pain Loc  --      Pain Education --      Exclude from Growth Chart --    No data found.  Updated Vital Signs BP 118/79 (BP Location: Right Arm)   Pulse 72   Temp 99.1 F (37.3 C) (Oral)   Resp 20   LMP 02/13/2024 (Exact Date)   SpO2 97%   Visual Acuity Right Eye Distance:   Left Eye Distance:   Bilateral Distance:    Right Eye Near:   Left Eye Near:    Bilateral Near:     Physical Exam Vitals and nursing note reviewed.  Constitutional:      General: She is not in acute distress.    Appearance: She is not toxic-appearing.  Pulmonary:     Effort: Pulmonary effort is normal. No respiratory distress.  Abdominal:     General: Abdomen is flat. Bowel sounds are normal. There is no distension.     Palpations: Abdomen is soft. There is no mass.     Tenderness: There is no abdominal tenderness. There is no right CVA tenderness, left CVA tenderness or guarding.  Genitourinary:    Comments: Deferred - self swab obtained by patient Skin:    General: Skin is warm and dry.     Coloration: Skin is not jaundiced or pale.     Findings: No erythema.  Neurological:     Mental Status: She is alert and oriented to person, place, and time.     Motor: No weakness.     Gait: Gait normal.  Psychiatric:        Behavior: Behavior is cooperative.      UC Treatments / Results  Labs (all labs ordered are listed, but only abnormal results are displayed) Labs Reviewed  POCT URINALYSIS DIP (MANUAL ENTRY) - Abnormal; Notable for the following components:      Result Value   Blood, UA trace-intact (*)    All other components within normal limits  URINE CULTURE  POCT URINE PREGNANCY  CERVICOVAGINAL ANCILLARY ONLY    EKG   Radiology No results found.  Procedures Procedures (including critical care time)  Medications Ordered in UC Medications - No data to display  Initial Impression / Assessment and Plan / UC Course  I have reviewed the triage vital signs and the nursing  notes.  Pertinent labs & imaging results that were available during my care of the patient were reviewed by me and considered in my medical decision making (see chart for details).   Patient is a well-appearing 33 year old female presenting today for burning, itching, irritation in her vaginal area.  Vital signs are stable and urinalysis today shows trace intact blood.  She did have sexual intercourse yesterday and symptoms began shortly after.  No known exposures to STI but is requesting STI testing today.  She declines HIV and syphilis testing today as she gets checked for this frequently per her report.  Vaginal cytology is pending.  Treat as indicated if anything other than yeast infection as we are treating for yeast vaginitis today.  Return and ER precautions discussed with patient.  Safe sex practices also discussed.  The patient was given the opportunity to ask questions.  All questions answered to their satisfaction.  The patient is in agreement to this plan.   Final Clinical Impressions(s) / UC Diagnoses   Final diagnoses:  Vaginal itching  Dysuria     Discharge Instructions      You are being treated today for a yeast infection.  Take the fluconazole  tablet as prescribed to treat it.  Urine sample today does not show any infection.  Will contact you if the urine culture shows infection later this week.  We will contact you if the testing from today comes back positive for anything else.  Recommend condom use with every sexual encounter to prevent STI.    ED Prescriptions     Medication Sig Dispense Auth. Provider   fluconazole  (DIFLUCAN ) 150 MG tablet Take 1 tablet (150 mg total) by mouth once for 1 dose. 1 tablet Chandra Harlene LABOR, NP      PDMP not reviewed this encounter.   Chandra Harlene LABOR, NP 03/06/24 1606

## 2024-03-06 NOTE — Discharge Instructions (Signed)
 You are being treated today for a yeast infection.  Take the fluconazole  tablet as prescribed to treat it.  Urine sample today does not show any infection.  Will contact you if the urine culture shows infection later this week.  We will contact you if the testing from today comes back positive for anything else.  Recommend condom use with every sexual encounter to prevent STI.

## 2024-03-06 NOTE — ED Triage Notes (Signed)
 Burning, itching, irritation started yesterday afternoon.

## 2024-03-07 ENCOUNTER — Ambulatory Visit (HOSPITAL_COMMUNITY): Payer: Self-pay

## 2024-03-07 LAB — CERVICOVAGINAL ANCILLARY ONLY
Bacterial Vaginitis (gardnerella): POSITIVE — AB
Candida Glabrata: NEGATIVE
Candida Vaginitis: POSITIVE — AB
Chlamydia: NEGATIVE
Comment: NEGATIVE
Comment: NEGATIVE
Comment: NEGATIVE
Comment: NEGATIVE
Comment: NEGATIVE
Comment: NORMAL
Neisseria Gonorrhea: NEGATIVE
Trichomonas: NEGATIVE

## 2024-03-07 LAB — URINE CULTURE: Culture: <10000 — AB

## 2024-03-07 MED ORDER — METRONIDAZOLE 0.75 % VA GEL: 1.0000 | Freq: Every day | VAGINAL | 0 refills | Status: AC

## 2024-03-07 MED ORDER — METRONIDAZOLE 500 MG PO TABS: 500.0000 mg | ORAL_TABLET | Freq: Two times a day (BID) | ORAL | 0 refills | Status: AC

## 2024-03-07 MED ORDER — FLUCONAZOLE 150 MG PO TABS: 150.0000 mg | ORAL_TABLET | Freq: Once | ORAL | 0 refills | Status: AC
